# Patient Record
Sex: Female | Born: 1965
Health system: Southern US, Community
[De-identification: ages and names within clinical notes are randomized; demographics above are authoritative.]

## PROBLEM LIST (undated history)

## (undated) DIAGNOSIS — R5383 Other fatigue: Secondary | ICD-10-CM

## (undated) DIAGNOSIS — Z9151 Personal history of suicidal behavior: Secondary | ICD-10-CM

## (undated) DIAGNOSIS — F32A Depression, unspecified: Secondary | ICD-10-CM

## (undated) DIAGNOSIS — R928 Other abnormal and inconclusive findings on diagnostic imaging of breast: Secondary | ICD-10-CM

## (undated) DIAGNOSIS — K219 Gastro-esophageal reflux disease without esophagitis: Secondary | ICD-10-CM

## (undated) DIAGNOSIS — E119 Type 2 diabetes mellitus without complications: Secondary | ICD-10-CM

## (undated) DIAGNOSIS — E669 Obesity, unspecified: Secondary | ICD-10-CM

## (undated) DIAGNOSIS — R7303 Prediabetes: Secondary | ICD-10-CM

## (undated) DIAGNOSIS — K5900 Constipation, unspecified: Secondary | ICD-10-CM

## (undated) DIAGNOSIS — F329 Major depressive disorder, single episode, unspecified: Secondary | ICD-10-CM

## (undated) DIAGNOSIS — Z915 Personal history of self-harm: Secondary | ICD-10-CM

## (undated) DIAGNOSIS — Z91410 Personal history of adult physical and sexual abuse: Secondary | ICD-10-CM

## (undated) DIAGNOSIS — I10 Essential (primary) hypertension: Secondary | ICD-10-CM

## (undated) DIAGNOSIS — D649 Anemia, unspecified: Secondary | ICD-10-CM

## (undated) DIAGNOSIS — F5104 Psychophysiologic insomnia: Secondary | ICD-10-CM

## (undated) HISTORY — DX: Obesity, unspecified: E66.9

## (undated) HISTORY — DX: Other abnormal and inconclusive findings on diagnostic imaging of breast: R92.8

## (undated) HISTORY — DX: Personal history of adult physical and sexual abuse: Z91.410

## (undated) HISTORY — DX: Psychophysiologic insomnia: F51.04

## (undated) HISTORY — DX: Depression, unspecified: F32.A

## (undated) HISTORY — DX: Constipation, unspecified: K59.00

## (undated) HISTORY — DX: Personal history of self-harm: Z91.5

## (undated) HISTORY — DX: Essential (primary) hypertension: I10

## (undated) HISTORY — DX: Personal history of suicidal behavior: Z91.51

## (undated) HISTORY — DX: Major depressive disorder, single episode, unspecified: F32.9

## (undated) HISTORY — DX: Other fatigue: R53.83

## (undated) HISTORY — DX: Gastro-esophageal reflux disease without esophagitis: K21.9

---

## 1989-02-24 HISTORY — PX: OTHER SURGICAL HISTORY: SHX169

## 1999-02-25 HISTORY — PX: TUBAL LIGATION: SHX77

## 2006-07-31 ENCOUNTER — Emergency Department: Payer: Self-pay | Admitting: Emergency Medicine

## 2008-02-07 ENCOUNTER — Emergency Department: Payer: Self-pay | Admitting: Emergency Medicine

## 2008-02-22 ENCOUNTER — Emergency Department: Payer: Self-pay | Admitting: Emergency Medicine

## 2010-02-12 ENCOUNTER — Encounter
Admission: RE | Admit: 2010-02-12 | Discharge: 2010-02-12 | Payer: Self-pay | Source: Home / Self Care | Attending: Gynecology | Admitting: Gynecology

## 2012-05-11 ENCOUNTER — Emergency Department: Payer: Self-pay | Admitting: Emergency Medicine

## 2013-01-06 LAB — LIPID PANEL
Cholesterol: 213 mg/dL — AB (ref 0–200)
HDL: 60 mg/dL (ref 35–70)
LDL Cholesterol: 127 mg/dL
Triglycerides: 131 mg/dL (ref 40–160)

## 2013-01-06 LAB — HM PAP SMEAR: HM PAP: NORMAL

## 2013-03-19 ENCOUNTER — Ambulatory Visit: Payer: Self-pay | Admitting: Family Medicine

## 2013-05-05 ENCOUNTER — Ambulatory Visit: Payer: Self-pay | Admitting: Family Medicine

## 2013-05-05 DIAGNOSIS — R928 Other abnormal and inconclusive findings on diagnostic imaging of breast: Secondary | ICD-10-CM

## 2013-05-05 HISTORY — DX: Other abnormal and inconclusive findings on diagnostic imaging of breast: R92.8

## 2013-12-01 ENCOUNTER — Ambulatory Visit: Payer: Self-pay | Admitting: Family Medicine

## 2014-05-08 ENCOUNTER — Ambulatory Visit: Payer: Self-pay | Admitting: Family Medicine

## 2014-05-08 LAB — HM MAMMOGRAPHY

## 2014-08-20 ENCOUNTER — Encounter: Payer: Self-pay | Admitting: Family Medicine

## 2014-08-20 DIAGNOSIS — F325 Major depressive disorder, single episode, in full remission: Secondary | ICD-10-CM | POA: Insufficient documentation

## 2014-08-20 DIAGNOSIS — R519 Headache, unspecified: Secondary | ICD-10-CM | POA: Insufficient documentation

## 2014-08-20 DIAGNOSIS — G4700 Insomnia, unspecified: Secondary | ICD-10-CM | POA: Insufficient documentation

## 2014-08-20 DIAGNOSIS — Z9151 Personal history of suicidal behavior: Secondary | ICD-10-CM | POA: Insufficient documentation

## 2014-08-20 DIAGNOSIS — E669 Obesity, unspecified: Secondary | ICD-10-CM | POA: Insufficient documentation

## 2014-08-20 DIAGNOSIS — R51 Headache: Secondary | ICD-10-CM

## 2014-08-20 DIAGNOSIS — K219 Gastro-esophageal reflux disease without esophagitis: Secondary | ICD-10-CM | POA: Insufficient documentation

## 2014-08-20 DIAGNOSIS — I10 Essential (primary) hypertension: Secondary | ICD-10-CM | POA: Insufficient documentation

## 2014-08-20 DIAGNOSIS — Z915 Personal history of self-harm: Secondary | ICD-10-CM | POA: Insufficient documentation

## 2014-08-23 ENCOUNTER — Ambulatory Visit (INDEPENDENT_AMBULATORY_CARE_PROVIDER_SITE_OTHER): Payer: BLUE CROSS/BLUE SHIELD | Admitting: Family Medicine

## 2014-08-23 ENCOUNTER — Encounter: Payer: Self-pay | Admitting: Family Medicine

## 2014-08-23 VITALS — BP 110/78 | HR 74 | Temp 97.8°F | Resp 16 | Ht 67.5 in | Wt 198.7 lb

## 2014-08-23 DIAGNOSIS — K219 Gastro-esophageal reflux disease without esophagitis: Secondary | ICD-10-CM | POA: Diagnosis not present

## 2014-08-23 DIAGNOSIS — Z79899 Other long term (current) drug therapy: Secondary | ICD-10-CM | POA: Diagnosis not present

## 2014-08-23 DIAGNOSIS — K5900 Constipation, unspecified: Secondary | ICD-10-CM

## 2014-08-23 DIAGNOSIS — Z13220 Encounter for screening for lipoid disorders: Secondary | ICD-10-CM | POA: Diagnosis not present

## 2014-08-23 DIAGNOSIS — I10 Essential (primary) hypertension: Secondary | ICD-10-CM | POA: Diagnosis not present

## 2014-08-23 DIAGNOSIS — F3341 Major depressive disorder, recurrent, in partial remission: Secondary | ICD-10-CM | POA: Diagnosis not present

## 2014-08-23 DIAGNOSIS — N920 Excessive and frequent menstruation with regular cycle: Secondary | ICD-10-CM

## 2014-08-23 DIAGNOSIS — N951 Menopausal and female climacteric states: Secondary | ICD-10-CM | POA: Diagnosis not present

## 2014-08-23 DIAGNOSIS — G4700 Insomnia, unspecified: Secondary | ICD-10-CM | POA: Diagnosis not present

## 2014-08-23 DIAGNOSIS — K5909 Other constipation: Secondary | ICD-10-CM | POA: Insufficient documentation

## 2014-08-23 MED ORDER — BUPROPION HCL ER (XL) 150 MG PO TB24: 150.0000 mg | ORAL_TABLET | Freq: Every day | ORAL | Status: DC

## 2014-08-23 MED ORDER — POLYETHYLENE GLYCOL 3350 17 G PO PACK: 17.0000 g | PACK | Freq: Every day | ORAL | Status: DC

## 2014-08-23 MED ORDER — NORGESTIMATE-ETH ESTRADIOL 0.25-35 MG-MCG PO TABS: 1.0000 | ORAL_TABLET | Freq: Every day | ORAL | Status: DC

## 2014-08-23 MED ORDER — DIVALPROEX SODIUM 250 MG PO DR TAB: 250.0000 mg | DELAYED_RELEASE_TABLET | Freq: Every evening | ORAL | Status: DC

## 2014-08-23 MED ORDER — HYDROCHLOROTHIAZIDE 12.5 MG PO TABS
12.5000 mg | ORAL_TABLET | Freq: Every day | ORAL | Status: DC
Start: 2014-08-23 — End: 2015-03-28

## 2014-08-23 MED ORDER — ALPRAZOLAM ER 0.5 MG PO TB24: 0.5000 mg | ORAL_TABLET | Freq: Every day | ORAL | Status: DC

## 2014-08-23 MED ORDER — DULOXETINE HCL 60 MG PO CPEP: 60.0000 mg | ORAL_CAPSULE | Freq: Every day | ORAL | Status: DC
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
## 2014-08-23 NOTE — Patient Instructions (Signed)

## 2014-08-24 LAB — COMPREHENSIVE METABOLIC PANEL
A/G RATIO: 1.6 (ref 1.1–2.5)
ALK PHOS: 42 IU/L (ref 39–117)
ALT: 14 IU/L (ref 0–32)
AST: 12 IU/L (ref 0–40)
Albumin: 4 g/dL (ref 3.5–5.5)
BUN/Creatinine Ratio: 13 (ref 9–23)
BUN: 11 mg/dL (ref 6–24)
CHLORIDE: 103 mmol/L (ref 97–108)
CO2: 25 mmol/L (ref 18–29)
Calcium: 8.9 mg/dL (ref 8.7–10.2)
Creatinine, Ser: 0.84 mg/dL (ref 0.57–1.00)
GFR, EST AFRICAN AMERICAN: 95 mL/min/{1.73_m2} (ref >59)
GFR, EST NON AFRICAN AMERICAN: 82 mL/min/{1.73_m2} (ref >59)
Globulin, Total: 2.5 g/dL (ref 1.5–4.5)
Glucose: 92 mg/dL (ref 65–99)
POTASSIUM: 4.6 mmol/L (ref 3.5–5.2)
Sodium: 141 mmol/L (ref 134–144)
Total Protein: 6.5 g/dL (ref 6.0–8.5)

## 2014-08-24 LAB — TSH: TSH: 0.514 u[IU]/mL (ref 0.450–4.500)

## 2014-08-24 LAB — CBC WITH DIFFERENTIAL/PLATELET
BASOS: 0 %
Basophils Absolute: 0 10*3/uL (ref 0.0–0.2)
EOS (ABSOLUTE): 0 10*3/uL (ref 0.0–0.4)
EOS: 0 %
HEMATOCRIT: 34.7 % (ref 34.0–46.6)
Hemoglobin: 11 g/dL — ABNORMAL LOW (ref 11.1–15.9)
IMMATURE GRANS (ABS): 0 10*3/uL (ref 0.0–0.1)
Immature Granulocytes: 0 %
LYMPHS: 49 %
Lymphocytes Absolute: 1.9 10*3/uL (ref 0.7–3.1)
MCH: 25.9 pg — ABNORMAL LOW (ref 26.6–33.0)
MCHC: 31.7 g/dL (ref 31.5–35.7)
MCV: 82 fL (ref 79–97)
Monocytes Absolute: 0.3 10*3/uL (ref 0.1–0.9)
Monocytes: 8 %
NEUTROS PCT: 43 %
Neutrophils Absolute: 1.7 10*3/uL (ref 1.4–7.0)
Platelets: 401 10*3/uL — ABNORMAL HIGH (ref 150–379)
RBC: 4.24 x10E6/uL (ref 3.77–5.28)
RDW: 14.9 % (ref 12.3–15.4)
WBC: 3.9 10*3/uL (ref 3.4–10.8)

## 2014-08-24 LAB — LIPID PANEL
Chol/HDL Ratio: 3.8 ratio units (ref 0.0–4.4)
Cholesterol, Total: 222 mg/dL — ABNORMAL HIGH (ref 100–199)
HDL: 58 mg/dL (ref >39)
LDL Calculated: 150 mg/dL — ABNORMAL HIGH (ref 0–99)
TRIGLYCERIDES: 72 mg/dL (ref 0–149)
VLDL CHOLESTEROL CAL: 14 mg/dL (ref 5–40)

## 2014-08-27 ENCOUNTER — Other Ambulatory Visit: Payer: Self-pay | Admitting: Family Medicine

## 2014-08-27 DIAGNOSIS — D649 Anemia, unspecified: Secondary | ICD-10-CM

## 2014-11-24 ENCOUNTER — Ambulatory Visit: Payer: BLUE CROSS/BLUE SHIELD | Admitting: Family Medicine

## 2014-12-05 ENCOUNTER — Ambulatory Visit: Payer: BLUE CROSS/BLUE SHIELD | Admitting: Family Medicine

## 2014-12-26 ENCOUNTER — Ambulatory Visit: Payer: BLUE CROSS/BLUE SHIELD | Admitting: Family Medicine

## 2015-02-21 ENCOUNTER — Other Ambulatory Visit: Payer: Self-pay | Admitting: Family Medicine

## 2015-02-21 NOTE — Telephone Encounter (Signed)
Patient requesting refill. 

## 2015-03-05 ENCOUNTER — Ambulatory Visit: Payer: BLUE CROSS/BLUE SHIELD | Admitting: Family Medicine

## 2015-03-19 ENCOUNTER — Ambulatory Visit: Payer: BLUE CROSS/BLUE SHIELD | Admitting: Family Medicine

## 2015-03-25 ENCOUNTER — Other Ambulatory Visit: Payer: Self-pay | Admitting: Family Medicine

## 2015-03-26 NOTE — Telephone Encounter (Signed)
Patient requesting refill. 

## 2015-03-28 ENCOUNTER — Ambulatory Visit (INDEPENDENT_AMBULATORY_CARE_PROVIDER_SITE_OTHER): Payer: BLUE CROSS/BLUE SHIELD | Admitting: Family Medicine

## 2015-03-28 ENCOUNTER — Encounter: Payer: Self-pay | Admitting: Family Medicine

## 2015-03-28 VITALS — BP 112/74 | HR 83 | Temp 97.8°F | Resp 16 | Ht 68.0 in | Wt 200.7 lb

## 2015-03-28 DIAGNOSIS — N920 Excessive and frequent menstruation with regular cycle: Secondary | ICD-10-CM | POA: Diagnosis not present

## 2015-03-28 DIAGNOSIS — F411 Generalized anxiety disorder: Secondary | ICD-10-CM | POA: Diagnosis not present

## 2015-03-28 DIAGNOSIS — K5900 Constipation, unspecified: Secondary | ICD-10-CM

## 2015-03-28 DIAGNOSIS — I10 Essential (primary) hypertension: Secondary | ICD-10-CM | POA: Diagnosis not present

## 2015-03-28 DIAGNOSIS — Z23 Encounter for immunization: Secondary | ICD-10-CM | POA: Diagnosis not present

## 2015-03-28 DIAGNOSIS — G4700 Insomnia, unspecified: Secondary | ICD-10-CM | POA: Diagnosis not present

## 2015-03-28 DIAGNOSIS — F3341 Major depressive disorder, recurrent, in partial remission: Secondary | ICD-10-CM

## 2015-03-28 DIAGNOSIS — K5909 Other constipation: Secondary | ICD-10-CM

## 2015-03-28 MED ORDER — DULOXETINE HCL 30 MG PO CPEP: 30.0000 mg | ORAL_CAPSULE | Freq: Every day | ORAL | Status: DC

## 2015-03-28 MED ORDER — POLYETHYLENE GLYCOL 3350 17 G PO PACK: 17.0000 g | PACK | Freq: Every day | ORAL | Status: DC

## 2015-03-28 MED ORDER — DIVALPROEX SODIUM 250 MG PO DR TAB: 250.0000 mg | DELAYED_RELEASE_TABLET | Freq: Every evening | ORAL | Status: DC

## 2015-03-28 MED ORDER — HYDROCHLOROTHIAZIDE 12.5 MG PO TABS: 12.5000 mg | ORAL_TABLET | Freq: Every day | ORAL | Status: DC

## 2015-03-28 MED ORDER — ALPRAZOLAM ER 0.5 MG PO TB24: 0.5000 mg | ORAL_TABLET | Freq: Every day | ORAL | Status: DC

## 2015-03-28 MED ORDER — BUPROPION HCL ER (XL) 300 MG PO TB24: 300.0000 mg | ORAL_TABLET | Freq: Every day | ORAL | Status: DC

## 2015-03-28 MED ORDER — QUETIAPINE FUMARATE 25 MG PO TABS: 25.0000 mg | ORAL_TABLET | Freq: Every day | ORAL | Status: DC
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
## 2015-04-25 ENCOUNTER — Encounter: Payer: Self-pay | Admitting: Family Medicine

## 2015-04-25 ENCOUNTER — Ambulatory Visit (INDEPENDENT_AMBULATORY_CARE_PROVIDER_SITE_OTHER): Payer: BLUE CROSS/BLUE SHIELD | Admitting: Family Medicine

## 2015-04-25 VITALS — BP 118/76 | HR 110 | Temp 98.3°F | Resp 18 | Ht 68.0 in | Wt 204.2 lb

## 2015-04-25 DIAGNOSIS — F3341 Major depressive disorder, recurrent, in partial remission: Secondary | ICD-10-CM

## 2015-04-25 DIAGNOSIS — J040 Acute laryngitis: Secondary | ICD-10-CM | POA: Diagnosis not present

## 2015-04-25 DIAGNOSIS — G4700 Insomnia, unspecified: Secondary | ICD-10-CM | POA: Diagnosis not present

## 2015-04-25 DIAGNOSIS — F411 Generalized anxiety disorder: Secondary | ICD-10-CM

## 2015-04-25 MED ORDER — QUETIAPINE FUMARATE 50 MG PO TABS: 50.0000 mg | ORAL_TABLET | Freq: Every day | ORAL | Status: DC

## 2015-04-25 MED ORDER — PREDNISONE 10 MG PO TABS: 10.0000 mg | ORAL_TABLET | Freq: Two times a day (BID) | ORAL | Status: DC

## 2015-04-25 MED ORDER — DULOXETINE HCL 30 MG PO CPEP: 90.0000 mg | ORAL_CAPSULE | Freq: Every day | ORAL | Status: DC

## 2015-04-25 MED ORDER — ALPRAZOLAM ER 0.5 MG PO TB24: 0.5000 mg | ORAL_TABLET | Freq: Every day | ORAL | Status: DC

## 2015-04-25 MED ORDER — BUPROPION HCL ER (XL) 300 MG PO TB24: 300.0000 mg | ORAL_TABLET | Freq: Every day | ORAL | Status: DC
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
## 2015-07-05 ENCOUNTER — Ambulatory Visit: Payer: BLUE CROSS/BLUE SHIELD | Admitting: Family Medicine

## 2017-02-10 ENCOUNTER — Ambulatory Visit (INDEPENDENT_AMBULATORY_CARE_PROVIDER_SITE_OTHER): Payer: BLUE CROSS/BLUE SHIELD | Admitting: Family Medicine

## 2017-02-10 ENCOUNTER — Encounter: Payer: Self-pay | Admitting: Family Medicine

## 2017-02-10 VITALS — BP 116/70 | HR 70 | Temp 97.1°F | Resp 12 | Ht 67.5 in | Wt 220.5 lb

## 2017-02-10 DIAGNOSIS — N920 Excessive and frequent menstruation with regular cycle: Secondary | ICD-10-CM | POA: Diagnosis not present

## 2017-02-10 DIAGNOSIS — Z113 Encounter for screening for infections with a predominantly sexual mode of transmission: Secondary | ICD-10-CM | POA: Diagnosis not present

## 2017-02-10 DIAGNOSIS — F325 Major depressive disorder, single episode, in full remission: Secondary | ICD-10-CM

## 2017-02-10 DIAGNOSIS — Z124 Encounter for screening for malignant neoplasm of cervix: Secondary | ICD-10-CM

## 2017-02-10 DIAGNOSIS — Z01419 Encounter for gynecological examination (general) (routine) without abnormal findings: Secondary | ICD-10-CM | POA: Diagnosis not present

## 2017-02-10 DIAGNOSIS — D649 Anemia, unspecified: Secondary | ICD-10-CM | POA: Diagnosis not present

## 2017-02-10 DIAGNOSIS — E785 Hyperlipidemia, unspecified: Secondary | ICD-10-CM | POA: Diagnosis not present

## 2017-02-10 DIAGNOSIS — F3341 Major depressive disorder, recurrent, in partial remission: Secondary | ICD-10-CM

## 2017-02-10 DIAGNOSIS — Z1239 Encounter for other screening for malignant neoplasm of breast: Secondary | ICD-10-CM

## 2017-02-10 DIAGNOSIS — Z1211 Encounter for screening for malignant neoplasm of colon: Secondary | ICD-10-CM

## 2017-02-10 DIAGNOSIS — Z13220 Encounter for screening for lipoid disorders: Secondary | ICD-10-CM

## 2017-02-10 MED ORDER — NORGESTIMATE-ETH ESTRADIOL 0.25-35 MG-MCG PO TABS: 1.0000 | ORAL_TABLET | Freq: Every day | ORAL | 11 refills | Status: DC

## 2017-02-10 NOTE — Patient Instructions (Addendum)
Preventive Care 40-64 Years, Female Preventive care refers to lifestyle choices and visits with your health care provider that can promote health and wellness. What does preventive care include?  A yearly physical exam. This is also called an annual well check.  Dental exams once or twice a year.  Routine eye exams. Ask your health care provider how often you should have your eyes checked.  Personal lifestyle choices, including: ? Daily care of your teeth and gums. ? Regular physical activity. ? Eating a healthy diet. ? Avoiding tobacco and drug use. ? Limiting alcohol use. ? Practicing safe sex. ? Taking low-dose aspirin daily starting at age 51. ? Taking vitamin and mineral supplements as recommended by your health care provider. What happens during an annual well check? The services and screenings done by your health care provider during your annual well check will depend on your age, overall health, lifestyle risk factors, and family history of disease. Counseling Your health care provider may ask you questions about your:  Alcohol use.  Tobacco use.  Drug use.  Emotional well-being.  Home and relationship well-being.  Sexual activity.  Eating habits.  Work and work Statistician.  Method of birth control.  Menstrual cycle.  Pregnancy history.  Screening You may have the following tests or measurements:  Height, weight, and BMI.  Blood pressure.  Lipid and cholesterol levels. These may be checked every 5 years, or more frequently if you are over 51 years old.  Skin check.  Lung cancer screening. You may have this screening every year starting at age 51 if you have a 30-pack-year history of smoking and currently smoke or have quit within the past 15 years.  Fecal occult blood test (FOBT) of the stool. You may have this test every year starting at age 51.  Flexible sigmoidoscopy or colonoscopy. You may have a sigmoidoscopy every 5 years or a colonoscopy  every 10 years starting at age 51.  Hepatitis C blood test.  Hepatitis B blood test.  Sexually transmitted disease (STD) testing.  Diabetes screening. This is done by checking your blood sugar (glucose) after you have not eaten for a while (fasting). You may have this done every 1-3 years.  Mammogram. This may be done every 1-2 years. Talk to your health care provider about when you should start having regular mammograms. This may depend on whether you have a family history of breast cancer.  BRCA-related cancer screening. This may be done if you have a family history of breast, ovarian, tubal, or peritoneal cancers.  Pelvic exam and Pap test. This may be done every 3 years starting at age 51. Starting at age 36, this may be done every 5 years if you have a Pap test in combination with an HPV test.  Bone density scan. This is done to screen for osteoporosis. You may have this scan if you are at high risk for osteoporosis.  Discuss your test results, treatment options, and if necessary, the need for more tests with your health care provider. Vaccines Your health care provider may recommend certain vaccines, such as:  Influenza vaccine. This is recommended every year.  Tetanus, diphtheria, and acellular pertussis (Tdap, Td) vaccine. You may need a Td booster every 10 years.  Varicella vaccine. You may need this if you have not been vaccinated.  Zoster vaccine. You may need this after age 5.  Measles, mumps, and rubella (MMR) vaccine. You may need at least one dose of MMR if you were born in  1957 or later. You may also need a second dose.  Pneumococcal 13-valent conjugate (PCV13) vaccine. You may need this if you have certain conditions and were not previously vaccinated.  Pneumococcal polysaccharide (PPSV23) vaccine. You may need one or two doses if you smoke cigarettes or if you have certain conditions.  Meningococcal vaccine. You may need this if you have certain  conditions.  Hepatitis A vaccine. You may need this if you have certain conditions or if you travel or work in places where you may be exposed to hepatitis A.  Hepatitis B vaccine. You may need this if you have certain conditions or if you travel or work in places where you may be exposed to hepatitis B.  Haemophilus influenzae type b (Hib) vaccine. You may need this if you have certain conditions.  Talk to your health care provider about which screenings and vaccines you need and how often you need them. This information is not intended to replace advice given to you by your health care provider. Make sure you discuss any questions you have with your health care provider. Document Released: 03/09/2015 Document Revised: 10/31/2015 Document Reviewed: 12/12/2014 Elsevier Interactive Patient Education  2017 [employer].  Discussed Shingrix with patient, check coverage with insurance
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
## 2017-02-13 LAB — PAP IG, CT-NG NAA, HPV HIGH-RISK
C. TRACHOMATIS RNA, TMA: NOT DETECTED
HPV DNA HIGH RISK: NOT DETECTED
N. gonorrhoeae RNA, TMA: NOT DETECTED

## 2017-02-17 LAB — LIPID PANEL
CHOL/HDL RATIO: 4.2 (calc) (ref <5.0)
Cholesterol: 210 mg/dL — ABNORMAL HIGH (ref <200)
HDL: 50 mg/dL — AB (ref >50)
LDL Cholesterol (Calc): 136 mg/dL (calc) — ABNORMAL HIGH
Non-HDL Cholesterol (Calc): 160 mg/dL (calc) — ABNORMAL HIGH (ref <130)
Triglycerides: 118 mg/dL (ref <150)

## 2017-02-17 LAB — COMPLETE METABOLIC PANEL WITH GFR
AG RATIO: 1.4 (calc) (ref 1.0–2.5)
ALBUMIN MSPROF: 3.9 g/dL (ref 3.6–5.1)
ALT: 11 U/L (ref 6–29)
AST: 14 U/L (ref 10–35)
Alkaline phosphatase (APISO): 57 U/L (ref 33–130)
BUN: 12 mg/dL (ref 7–25)
CALCIUM: 8.9 mg/dL (ref 8.6–10.4)
CHLORIDE: 107 mmol/L (ref 98–110)
CO2: 26 mmol/L (ref 20–32)
Creat: 0.85 mg/dL (ref 0.50–1.05)
GFR, EST AFRICAN AMERICAN: 92 mL/min/{1.73_m2} (ref >=60)
GFR, EST NON AFRICAN AMERICAN: 79 mL/min/{1.73_m2} (ref >=60)
GLUCOSE: 83 mg/dL (ref 65–99)
Globulin: 2.8 g/dL (calc) (ref 1.9–3.7)
Potassium: 4 mmol/L (ref 3.5–5.3)
Sodium: 140 mmol/L (ref 135–146)
TOTAL PROTEIN: 6.7 g/dL (ref 6.1–8.1)
Total Bilirubin: 0.2 mg/dL (ref 0.2–1.2)

## 2017-02-17 LAB — CBC WITH DIFFERENTIAL/PLATELET
BASOS ABS: 10 {cells}/uL (ref 0–200)
Basophils Relative: 0.2 %
EOS ABS: 160 {cells}/uL (ref 15–500)
EOS PCT: 3.2 %
HEMATOCRIT: 32 % — AB (ref 35.0–45.0)
HEMOGLOBIN: 10 g/dL — AB (ref 11.7–15.5)
LYMPHS ABS: 2265 {cells}/uL (ref 850–3900)
MCH: 24.6 pg — AB (ref 27.0–33.0)
MCHC: 31.3 g/dL — ABNORMAL LOW (ref 32.0–36.0)
MCV: 78.6 fL — AB (ref 80.0–100.0)
MPV: 10.2 fL (ref 7.5–12.5)
Monocytes Relative: 9.9 %
NEUTROS ABS: 2070 {cells}/uL (ref 1500–7800)
NEUTROS PCT: 41.4 %
Platelets: 381 10*3/uL (ref 140–400)
RBC: 4.07 10*6/uL (ref 3.80–5.10)
RDW: 15.3 % — AB (ref 11.0–15.0)
Total Lymphocyte: 45.3 %
WBC: 5 10*3/uL (ref 3.8–10.8)
WBCMIX: 495 {cells}/uL (ref 200–950)

## 2017-02-17 LAB — HIV ANTIBODY (ROUTINE TESTING W REFLEX): HIV: NONREACTIVE

## 2017-02-17 LAB — IRON,TIBC AND FERRITIN PANEL
%SAT: 9 % — AB (ref 11–50)
FERRITIN: 13 ng/mL (ref 10–232)
Iron: 37 ug/dL — ABNORMAL LOW (ref 45–160)
TIBC: 415 mcg/dL (calc) (ref 250–450)

## 2017-02-17 LAB — HEPATITIS PANEL, ACUTE
HEP C AB: NONREACTIVE
Hep A IgM: NONREACTIVE
Hep B C IgM: NONREACTIVE
Hepatitis B Surface Ag: NONREACTIVE
SIGNAL TO CUT-OFF: 0.02 (ref <1.00)

## 2017-02-17 LAB — RPR: RPR: NONREACTIVE

## 2017-02-17 LAB — HEMOGLOBIN A1C
HEMOGLOBIN A1C: 6.1 %{Hb} — AB (ref <5.7)
Mean Plasma Glucose: 128 (calc)
eAG (mmol/L): 7.1 (calc)

## 2017-02-17 LAB — TEST AUTHORIZATION

## 2017-02-17 LAB — TSH: TSH: 1.19 mIU/L

## 2017-02-19 ENCOUNTER — Telehealth: Payer: Self-pay | Admitting: Family Medicine

## 2017-02-19 NOTE — Telephone Encounter (Signed)
Copied from [place]. 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 Please give pt. Her lab result. Thanks

## 2017-02-19 NOTE — Telephone Encounter (Signed)
Pt  Notified  Of    Low  Iron  Results    Pt    Advised    That she  Needed  To  Take   Ferrous   Sulfate.  Pt  Stated  She  Did  Not  Wish  To  Take  Ferrous  Sulfate   As  It  Constipates  Her .  She  Is  Interested  In   other  Options.  She  Has  Cologard  Kit but has  Not  Completed  It yet .

## 2017-02-20 NOTE — Telephone Encounter (Signed)
Patient states she will not eat any nuts but will try the diet with high Iron foods such as liver, beans and leafy greens.

## 2017-02-20 NOTE — Telephone Encounter (Signed)
Any other options than the Iron tablets since the patient declined this option.

## 2017-05-03 DIAGNOSIS — Z1211 Encounter for screening for malignant neoplasm of colon: Secondary | ICD-10-CM | POA: Diagnosis not present

## 2017-05-03 DIAGNOSIS — Z1212 Encounter for screening for malignant neoplasm of rectum: Secondary | ICD-10-CM | POA: Diagnosis not present

## 2017-05-13 LAB — COLOGUARD: Cologuard: NEGATIVE

## 2017-05-19 ENCOUNTER — Encounter: Payer: Self-pay | Admitting: Family Medicine

## 2017-08-07 ENCOUNTER — Encounter: Payer: Self-pay | Admitting: Nurse Practitioner

## 2017-08-07 ENCOUNTER — Ambulatory Visit: Payer: BLUE CROSS/BLUE SHIELD | Admitting: Nurse Practitioner

## 2017-08-07 VITALS — BP 124/80 | HR 89 | Temp 98.4°F | Resp 16 | Ht 68.0 in | Wt 219.9 lb

## 2017-08-07 DIAGNOSIS — D500 Iron deficiency anemia secondary to blood loss (chronic): Secondary | ICD-10-CM | POA: Diagnosis not present

## 2017-08-07 DIAGNOSIS — N920 Excessive and frequent menstruation with regular cycle: Secondary | ICD-10-CM
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
## 2017-08-07 NOTE — Patient Instructions (Addendum)
Please do call to schedule your mammogram; the number to schedule one at either [facility] or [facility] is [phone]   Will check lab work to see where your blood count and iron are now, based off that will order supplementation - Increase fiber diet and/OR take fiber supplement like metamucil - Take colace (docusate sodium) if needed for constipation  - Drinking 64 ounces of water a day   Dysmenorrhea Dysmenorrhea means painful cramps during your period (menstrual period). You will have pain in your lower belly (abdomen). The pain is caused by the tightening (contracting) of the muscles of the womb (uterus). The pain may be mild or very bad. With this condition, you may:  Have a headache.  Feel sick to your stomach (nauseous).  Throw up (vomit).  Have lower back pain.  Follow these instructions at home: Helping pain and cramping  Put heat on your lower back or belly when you have pain or cramps. Use the heat source that your doctor tells you to use. ? Place a towel between your skin and the heat. ? Leave the heat on for 20-30 minutes. ? Remove the heat if your skin turns bright red. This is especially important if you cannot feel pain, heat, or cold. ? Do not have a heating pad on during sleep.  Do aerobic exercises. These include walking, swimming, or biking. These may help with cramps.  Massage your lower back or belly. This may help lessen pain. General instructions  Take over-the-counter and prescription medicines only as told by your doctor.  Do not drive or use heavy machinery while taking prescription pain medicine.  Avoid alcohol and caffeine during and right before your period. These can make cramps worse.  Do not use any products that have nicotine or tobacco. These include cigarettes and e-cigarettes. If you need help quitting, ask your doctor.  Keep all follow-up visits as told by your doctor. This is important. Contact a  doctor if:  You have pain that gets worse.  You have pain that does not get better with medicine.  You have pain during sex.  You feel sick to your stomach or you throw up during your period, and medicine does not help. Get help right away if:  You pass out (faint). Summary  Dysmenorrhea means painful cramps during your period (menstrual period).  Put heat on your lower back or belly when you have pain or cramps.  Do exercises like walking, swimming, or biking to help with cramps.  Contact a doctor if you have pain during sex. This information is not intended to replace advice given to you by your health care provider. Make sure you discuss any questions you have with your health care provider. Document Released: 05/09/2008 Document Revised: 02/28/2016 Document Reviewed: 02/28/2016 Elsevier Interactive Patient Education  2017 [employer].

## 2017-08-08 LAB — CBC
HEMATOCRIT: 28.5 % — AB (ref 35.0–45.0)
Hemoglobin: 8.7 g/dL — ABNORMAL LOW (ref 11.7–15.5)
MCH: 22.1 pg — ABNORMAL LOW (ref 27.0–33.0)
MCHC: 30.5 g/dL — ABNORMAL LOW (ref 32.0–36.0)
MCV: 72.5 fL — AB (ref 80.0–100.0)
MPV: 10 fL (ref 7.5–12.5)
Platelets: 428 10*3/uL — ABNORMAL HIGH (ref 140–400)
RBC: 3.93 10*6/uL (ref 3.80–5.10)
RDW: 15.7 % — ABNORMAL HIGH (ref 11.0–15.0)
WBC: 6.5 10*3/uL (ref 3.8–10.8)

## 2017-08-08 LAB — IRON,TIBC AND FERRITIN PANEL
%SAT: 7 % — AB (ref 11–50)
FERRITIN: 8 ng/mL — AB (ref 10–232)
Iron: 32 ug/dL — ABNORMAL LOW (ref 45–160)
TIBC: 491 mcg/dL (calc) — ABNORMAL HIGH (ref 250–450)

## 2017-08-10 ENCOUNTER — Other Ambulatory Visit: Payer: Self-pay | Admitting: Nurse Practitioner

## 2017-08-10 DIAGNOSIS — D500 Iron deficiency anemia secondary to blood loss (chronic): Secondary | ICD-10-CM

## 2017-08-12 ENCOUNTER — Ambulatory Visit: Payer: BLUE CROSS/BLUE SHIELD | Admitting: Family Medicine
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
## 2017-08-18 ENCOUNTER — Encounter: Payer: Self-pay | Admitting: Oncology

## 2017-08-18 ENCOUNTER — Inpatient Hospital Stay: Payer: BLUE CROSS/BLUE SHIELD

## 2017-08-18 ENCOUNTER — Inpatient Hospital Stay: Payer: BLUE CROSS/BLUE SHIELD | Attending: Oncology | Admitting: Oncology

## 2017-08-18 ENCOUNTER — Other Ambulatory Visit: Payer: Self-pay

## 2017-08-18 VITALS — BP 160/90 | HR 80 | Temp 98.0°F | Resp 18 | Wt 222.3 lb

## 2017-08-18 VITALS — BP 154/89 | HR 70 | Resp 18

## 2017-08-18 DIAGNOSIS — N920 Excessive and frequent menstruation with regular cycle: Secondary | ICD-10-CM

## 2017-08-18 DIAGNOSIS — D500 Iron deficiency anemia secondary to blood loss (chronic): Secondary | ICD-10-CM

## 2017-08-18 MED ORDER — SODIUM CHLORIDE 0.9 % IV SOLN
Freq: Once | INTRAVENOUS | Status: AC
  Administered 2017-08-18: 15:00:00 via INTRAVENOUS
  Filled 2017-08-18: qty 1000

## 2017-08-18 MED ORDER — FAMOTIDINE IN NACL 20-0.9 MG/50ML-% IV SOLN
20.0000 mg | Freq: Two times a day (BID) | INTRAVENOUS | Status: DC
  Administered 2017-08-18: 20 mg via INTRAVENOUS
  Filled 2017-08-18: qty 50

## 2017-08-18 MED ORDER — SODIUM CHLORIDE 0.9 % IV SOLN
510.0000 mg | Freq: Once | INTRAVENOUS | Status: AC
  Administered 2017-08-18: 510 mg via INTRAVENOUS
  Filled 2017-08-18: qty 17

## 2017-08-18 NOTE — Progress Notes (Signed)
New patient in for iron deficiency anemia.  

## 2017-08-18 NOTE — Progress Notes (Signed)
15:10 - patient completed Feraheme at 1458, complained of chest heaviness; checked vitals (see flowsheet) and called Beckey Rutter, NP who came over to assess the patient.  Patient complained of no other symptoms.  Patient stated she had eaten tacos prior to her appointment and she may have indigestion.  Lauren ordered Pepcid, reassessed patient after receiving Pepcid and stated she no longer felt the chest heaviness and was feeling back to baseline.
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
## 2017-10-21 ENCOUNTER — Other Ambulatory Visit: Payer: Self-pay

## 2017-10-21 ENCOUNTER — Inpatient Hospital Stay: Payer: BLUE CROSS/BLUE SHIELD | Attending: Oncology

## 2017-10-21 ENCOUNTER — Inpatient Hospital Stay: Payer: BLUE CROSS/BLUE SHIELD

## 2017-10-21 ENCOUNTER — Inpatient Hospital Stay (HOSPITAL_BASED_OUTPATIENT_CLINIC_OR_DEPARTMENT_OTHER): Payer: BLUE CROSS/BLUE SHIELD | Admitting: Oncology

## 2017-10-21 VITALS — BP 179/94 | HR 56 | Resp 18

## 2017-10-21 VITALS — BP 156/77 | HR 58 | Temp 97.4°F | Resp 18 | Wt 217.1 lb

## 2017-10-21 DIAGNOSIS — D509 Iron deficiency anemia, unspecified: Secondary | ICD-10-CM | POA: Diagnosis not present

## 2017-10-21 DIAGNOSIS — D500 Iron deficiency anemia secondary to blood loss (chronic): Secondary | ICD-10-CM

## 2017-10-21 LAB — CBC WITH DIFFERENTIAL/PLATELET
BASOS ABS: 0 10*3/uL (ref 0–0.1)
BASOS PCT: 0 %
EOS ABS: 0.1 10*3/uL (ref 0–0.7)
Eosinophils Relative: 3 %
HEMATOCRIT: 31.3 % — AB (ref 35.0–47.0)
Hemoglobin: 10 g/dL — ABNORMAL LOW (ref 12.0–16.0)
Lymphocytes Relative: 37 %
Lymphs Abs: 1.8 10*3/uL (ref 1.0–3.6)
MCH: 24.4 pg — ABNORMAL LOW (ref 26.0–34.0)
MCHC: 31.8 g/dL — AB (ref 32.0–36.0)
MCV: 76.7 fL — ABNORMAL LOW (ref 80.0–100.0)
MONOS PCT: 11 %
Monocytes Absolute: 0.5 10*3/uL (ref 0.2–0.9)
NEUTROS ABS: 2.5 10*3/uL (ref 1.4–6.5)
NEUTROS PCT: 49 %
Platelets: 363 10*3/uL (ref 150–440)
RBC: 4.09 MIL/uL (ref 3.80–5.20)
RDW: 18.1 % — ABNORMAL HIGH (ref 11.5–14.5)
WBC: 5 10*3/uL (ref 3.6–11.0)

## 2017-10-21 LAB — IRON AND TIBC
IRON: 35 ug/dL (ref 28–170)
Saturation Ratios: 7 % — ABNORMAL LOW (ref 10.4–31.8)
TIBC: 481 ug/dL — ABNORMAL HIGH (ref 250–450)
UIBC: 446 ug/dL

## 2017-10-21 LAB — FERRITIN: FERRITIN: 6 ng/mL — AB (ref 11–307)

## 2017-10-21 MED ORDER — SODIUM CHLORIDE 0.9 % IV SOLN
Freq: Once | INTRAVENOUS | Status: AC
  Administered 2017-10-21: 15:00:00 via INTRAVENOUS
  Filled 2017-10-21: qty 250

## 2017-10-21 MED ORDER — DIPHENHYDRAMINE HCL 50 MG/ML IJ SOLN
25.0000 mg | Freq: Once | INTRAMUSCULAR | Status: AC
  Administered 2017-10-21: 25 mg via INTRAVENOUS

## 2017-10-21 MED ORDER — SODIUM CHLORIDE 0.9 % IV SOLN
510.0000 mg | Freq: Once | INTRAVENOUS | Status: AC
  Administered 2017-10-21: 510 mg via INTRAVENOUS
  Filled 2017-10-21: qty 17

## 2017-10-21 MED ORDER — METHYLPREDNISOLONE SODIUM SUCC 125 MG IJ SOLR
125.0000 mg | Freq: Once | INTRAMUSCULAR | Status: AC
  Administered 2017-10-21: 125 mg via INTRAVENOUS

## 2017-10-21 NOTE — Progress Notes (Signed)
Pt here today for second infusion of feraheme, soon after starting infusion pt began to complain of SOB, CP, being hot and nausea. Infusion immediately stopped and NS bolus started, along with standard emergency meds, see MAR, provider notified and at chairside. VS taken and BP remains elevated, multiple staff members at chairside to assist in pt care, pt stabilized and pt verbalized feeling "normal" prior to d/c today.

## 2017-10-21 NOTE — Progress Notes (Signed)
Here for follow up. Stated feeling good and energy level" ok"  Noticed immediate change after last infusion ( improved energy)

## 2017-11-05 ENCOUNTER — Ambulatory Visit: Payer: BLUE CROSS/BLUE SHIELD | Admitting: Family Medicine

## 2017-11-05 ENCOUNTER — Encounter: Payer: Self-pay | Admitting: Family Medicine

## 2017-11-05 VITALS — BP 112/60 | HR 103 | Temp 98.3°F | Resp 16 | Ht 68.0 in | Wt 216.2 lb

## 2017-11-05 DIAGNOSIS — D500 Iron deficiency anemia secondary to blood loss (chronic): Secondary | ICD-10-CM

## 2017-11-05 DIAGNOSIS — F325 Major depressive disorder, single episode, in full remission: Secondary | ICD-10-CM

## 2017-11-05 DIAGNOSIS — Z23 Encounter for immunization: Secondary | ICD-10-CM | POA: Diagnosis not present

## 2017-11-05 DIAGNOSIS — N920 Excessive and frequent menstruation with regular cycle: Secondary | ICD-10-CM | POA: Diagnosis not present
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
## 2017-11-10 ENCOUNTER — Ambulatory Visit: Payer: BLUE CROSS/BLUE SHIELD | Admitting: Family Medicine

## 2017-11-13 ENCOUNTER — Ambulatory Visit: Payer: BLUE CROSS/BLUE SHIELD | Admitting: Family Medicine

## 2017-11-30 ENCOUNTER — Ambulatory Visit (INDEPENDENT_AMBULATORY_CARE_PROVIDER_SITE_OTHER): Payer: BLUE CROSS/BLUE SHIELD | Admitting: Obstetrics and Gynecology

## 2017-11-30 ENCOUNTER — Ambulatory Visit (INDEPENDENT_AMBULATORY_CARE_PROVIDER_SITE_OTHER): Payer: BLUE CROSS/BLUE SHIELD

## 2017-11-30 ENCOUNTER — Encounter: Payer: Self-pay | Admitting: Obstetrics and Gynecology

## 2017-11-30 VITALS — BP 124/78 | Ht 67.0 in | Wt 219.0 lb

## 2017-11-30 DIAGNOSIS — D250 Submucous leiomyoma of uterus: Secondary | ICD-10-CM

## 2017-11-30 DIAGNOSIS — D251 Intramural leiomyoma of uterus: Secondary | ICD-10-CM

## 2017-11-30 DIAGNOSIS — N939 Abnormal uterine and vaginal bleeding, unspecified: Secondary | ICD-10-CM
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
## 2017-12-03 ENCOUNTER — Telehealth: Payer: Self-pay | Admitting: Obstetrics and Gynecology

## 2017-12-03 NOTE — Telephone Encounter (Signed)
Patient is calling for results. Patient is requesting a call between 1 pm and 3 pm due to work. Thank you

## 2017-12-09 ENCOUNTER — Telehealth: Payer: Self-pay | Admitting: Obstetrics and Gynecology

## 2017-12-10 NOTE — Telephone Encounter (Signed)
Pt called after hour nurse last night at 5:51pm stating she needs her test results.  She can be reached only between the hours of 1:30p-2:20p.  [phone]

## 2017-12-10 NOTE — Telephone Encounter (Signed)
Please call her at that time and put her on hold and come get me. Thanks, Dr. Gilman Schmidt

## 2017-12-10 NOTE — Telephone Encounter (Signed)
CRS spoke c pt.

## 2017-12-11 ENCOUNTER — Telehealth: Payer: Self-pay | Admitting: Obstetrics and Gynecology

## 2017-12-11 NOTE — Telephone Encounter (Signed)
Patient is aware of H&P at [facility] on 12/31/17 @ 1:30pm, Pre-admit Testing phone interview to be scheduled, and OR on 01/07/18. Patient is aware she may receive calls from the [facility]. Patient confirmed BCBS and no secondary insurance. BCBS information discussed. Ext given.

## 2017-12-19 ENCOUNTER — Other Ambulatory Visit: Payer: Self-pay | Admitting: Family Medicine

## 2017-12-19 DIAGNOSIS — N920 Excessive and frequent menstruation with regular cycle: Secondary | ICD-10-CM

## 2017-12-31 ENCOUNTER — Encounter: Payer: Self-pay | Admitting: Obstetrics and Gynecology

## 2017-12-31 ENCOUNTER — Ambulatory Visit (INDEPENDENT_AMBULATORY_CARE_PROVIDER_SITE_OTHER): Payer: BLUE CROSS/BLUE SHIELD | Admitting: Obstetrics and Gynecology

## 2017-12-31 VITALS — BP 124/60 | HR 85 | Ht 67.5 in | Wt 223.0 lb

## 2017-12-31 DIAGNOSIS — N939 Abnormal uterine and vaginal bleeding, unspecified: Secondary | ICD-10-CM

## 2017-12-31 DIAGNOSIS — D250 Submucous leiomyoma of uterus: Secondary | ICD-10-CM

## 2018-01-01 ENCOUNTER — Encounter
Admission: RE | Admit: 2018-01-01 | Discharge: 2018-01-01 | Disposition: A | Discharge status: Home / Self Care | Payer: BLUE CROSS/BLUE SHIELD | Source: Ambulatory Visit | Attending: Obstetrics and Gynecology | Admitting: Obstetrics and Gynecology

## 2018-01-01 ENCOUNTER — Other Ambulatory Visit: Payer: Self-pay

## 2018-01-01 HISTORY — DX: Anemia, unspecified: D64.9

## 2018-01-01 NOTE — Patient Instructions (Signed)
Your procedure is scheduled on: 01-07-18  Report to Same Day Surgery 2nd floor medical mall [facility] Entrance-take elevator on left to 2nd floor.  Check in with surgery information desk.) To find out your arrival time please call [phone] between 1PM - 3PM on 01-06-18  Remember: Instructions that are not followed completely may result in serious medical risk, up to and including death, or upon the discretion of your surgeon and anesthesiologist your surgery may need to be rescheduled.    _x___ 1. Do not eat food after midnight the night before your procedure. You may drink clear liquids up to 2 hours before you are scheduled to arrive at the hospital for your procedure.  Do not drink clear liquids within 2 hours of your scheduled arrival to the hospital.  Clear liquids include  --Water or Apple juice without pulp  --Clear carbohydrate beverage such as ClearFast or Gatorade  --Black Coffee or Clear Tea (No milk, no creamers, do not add anything to the coffee or Tea   ____Ensure clear carbohydrate drink on the way to the hospital for bariatric patients  ____Ensure clear carbohydrate drink 3 hours before surgery for Dr Dwyane Luo patients if physician instructed.   No gum chewing or hard candies.     __x__ 2. No Alcohol for 24 hours before or after surgery.   __x__3. No Smoking or e-cigarettes for 24 prior to surgery.  Do not use any chewable tobacco products for at least 6 hour prior to surgery   ____  4. Bring all medications with you on the day of surgery if instructed.    __x__ 5. Notify your doctor if there is any change in your medical condition     (cold, fever, infections).    x___6. On the morning of surgery brush your teeth with toothpaste and water.  You may rinse your mouth with mouth wash if you wish.  Do not swallow any toothpaste or mouthwash.   Do not wear jewelry, make-up, hairpins, clips or nail polish.  Do not wear lotions, powders, or perfumes. You may wear  deodorant.  Do not shave 48 hours prior to surgery. Men may shave face and neck.  Do not bring valuables to the hospital.    [facility] is not responsible for any belongings or valuables.               Contacts, dentures or bridgework may not be worn into surgery.  Leave your suitcase in the car. After surgery it may be brought to your room.  For patients admitted to the hospital, discharge time is determined by your treatment team.  _  Patients discharged the day of surgery will not be allowed to drive home.  You will need someone to drive you home and stay with you the night of your procedure.    Please read over the following fact sheets that you were given:   [facility] Preparing for Surgery  ____ Take anti-hypertensive listed below, cardiac, seizure, asthma, anti-reflux and psychiatric medicines. These include:  1. NONE  2.  3.  4.  5.  6.  ____Fleets enema or Magnesium Citrate as directed.   ____ Use CHG Soap or sage wipes as directed on instruction sheet   ____ Use inhalers on the day of surgery and bring to hospital day of surgery  ____ Stop Metformin and Janumet 2 days prior to surgery.    ____ Take 1/2 of usual insulin dose the night before surgery and none on  the morning surgery.   ____ Follow recommendations from Cardiologist, Pulmonologist or PCP regarding  stopping Aspirin, Coumadin, Plavix ,Eliquis, Effient, or Pradaxa, and Pletal.  X____Stop Anti-inflammatories such as Advil, Aleve, Ibuprofen, Motrin, Naproxen, Naprosyn, Goodies powders or aspirin products NOW- OK to take Tylenol    ____ Stop supplements until after surgery.    ____ Bring C-Pap to the hospital.

## 2018-01-07 ENCOUNTER — Ambulatory Visit
Admission: RE | Admit: 2018-01-07 | Discharge: 2018-01-07 | Disposition: A | Discharge status: Home / Self Care | Payer: BLUE CROSS/BLUE SHIELD | Source: Ambulatory Visit | Attending: Obstetrics and Gynecology | Admitting: Obstetrics and Gynecology

## 2018-01-07 ENCOUNTER — Ambulatory Visit: Payer: BLUE CROSS/BLUE SHIELD | Admitting: Certified Registered Nurse Anesthetist

## 2018-01-07 ENCOUNTER — Encounter
Admission: RE | Disposition: A | Discharge status: Home / Self Care | Payer: Self-pay | Source: Ambulatory Visit | Attending: Obstetrics and Gynecology

## 2018-01-07 DIAGNOSIS — Z87891 Personal history of nicotine dependence: Secondary | ICD-10-CM | POA: Diagnosis not present

## 2018-01-07 DIAGNOSIS — D250 Submucous leiomyoma of uterus: Secondary | ICD-10-CM | POA: Diagnosis not present

## 2018-01-07 DIAGNOSIS — I10 Essential (primary) hypertension: Secondary | ICD-10-CM | POA: Diagnosis not present

## 2018-01-07 HISTORY — PX: DILATATION & CURETTAGE/HYSTEROSCOPY WITH MYOSURE: SHX6511

## 2018-01-07 LAB — CBC
HEMATOCRIT: 32.3 % — AB (ref 36.0–46.0)
Hemoglobin: 9.6 g/dL — ABNORMAL LOW (ref 12.0–15.0)
MCH: 23.4 pg — AB (ref 26.0–34.0)
MCHC: 29.7 g/dL — ABNORMAL LOW (ref 30.0–36.0)
MCV: 78.6 fL — AB (ref 80.0–100.0)
PLATELETS: 477 10*3/uL — AB (ref 150–400)
RBC: 4.11 MIL/uL (ref 3.87–5.11)
RDW: 17.4 % — AB (ref 11.5–15.5)
WBC: 4.7 10*3/uL (ref 4.0–10.5)
nRBC: 0 % (ref 0.0–0.2)

## 2018-01-07 LAB — TYPE AND SCREEN
ABO/RH(D): O POS
Antibody Screen: NEGATIVE

## 2018-01-07 LAB — POCT PREGNANCY, URINE: Preg Test, Ur: NEGATIVE

## 2018-01-07 SURGERY — DILATATION & CURETTAGE/HYSTEROSCOPY WITH MYOSURE
Anesthesia: General

## 2018-01-07 MED ORDER — FENTANYL CITRATE (PF) 100 MCG/2ML IJ SOLN
INTRAMUSCULAR | Status: AC
  Filled 2018-01-07: qty 2

## 2018-01-07 MED ORDER — SODIUM CHLORIDE 0.9 % IR SOLN
Status: DC | PRN
  Administered 2018-01-07: 1620 mL

## 2018-01-07 MED ORDER — LACTATED RINGERS IV SOLN
INTRAVENOUS | Status: DC
  Administered 2018-01-07 (×2): via INTRAVENOUS

## 2018-01-07 MED ORDER — FENTANYL CITRATE (PF) 100 MCG/2ML IJ SOLN
INTRAMUSCULAR | Status: AC
  Administered 2018-01-07: 25 ug via INTRAVENOUS
  Filled 2018-01-07: qty 2

## 2018-01-07 MED ORDER — PROPOFOL 10 MG/ML IV BOLUS
INTRAVENOUS | Status: DC | PRN
  Administered 2018-01-07: 200 mg via INTRAVENOUS

## 2018-01-07 MED ORDER — OXYCODONE HCL 5 MG PO TABS: 5.0000 mg | ORAL_TABLET | Freq: Three times a day (TID) | ORAL | 0 refills | Status: DC | PRN

## 2018-01-07 MED ORDER — MIDAZOLAM HCL 2 MG/2ML IJ SOLN
INTRAMUSCULAR | Status: AC
  Filled 2018-01-07: qty 2

## 2018-01-07 MED ORDER — ONDANSETRON HCL 4 MG/2ML IJ SOLN
INTRAMUSCULAR | Status: AC
  Filled 2018-01-07: qty 2

## 2018-01-07 MED ORDER — PROPOFOL 10 MG/ML IV BOLUS
INTRAVENOUS | Status: AC
  Filled 2018-01-07: qty 20

## 2018-01-07 MED ORDER — IBUPROFEN 600 MG PO TABS: 600.0000 mg | ORAL_TABLET | Freq: Four times a day (QID) | ORAL | 3 refills | Status: DC | PRN

## 2018-01-07 MED ORDER — LIDOCAINE HCL (CARDIAC) PF 100 MG/5ML IV SOSY
PREFILLED_SYRINGE | INTRAVENOUS | Status: DC | PRN
  Administered 2018-01-07: 60 mg via INTRAVENOUS

## 2018-01-07 MED ORDER — PROMETHAZINE HCL 25 MG/ML IJ SOLN
6.2500 mg | INTRAMUSCULAR | Status: DC | PRN
  Administered 2018-01-07: 6.25 mg via INTRAVENOUS

## 2018-01-07 MED ORDER — VASOPRESSIN 20 UNIT/ML IV SOLN
INTRAVENOUS | Status: AC
  Filled 2018-01-07: qty 1

## 2018-01-07 MED ORDER — KETOROLAC TROMETHAMINE 30 MG/ML IJ SOLN
INTRAMUSCULAR | Status: DC | PRN
  Administered 2018-01-07: 30 mg via INTRAVENOUS

## 2018-01-07 MED ORDER — LACTATED RINGERS IV SOLN: INTRAVENOUS | Status: DC

## 2018-01-07 MED ORDER — MIDAZOLAM HCL 5 MG/5ML IJ SOLN
INTRAMUSCULAR | Status: DC | PRN
  Administered 2018-01-07: 2 mg via INTRAVENOUS

## 2018-01-07 MED ORDER — SODIUM CHLORIDE FLUSH 0.9 % IV SOLN
INTRAVENOUS | Status: AC
  Filled 2018-01-07: qty 10

## 2018-01-07 MED ORDER — FENTANYL CITRATE (PF) 100 MCG/2ML IJ SOLN
INTRAMUSCULAR | Status: DC | PRN
  Administered 2018-01-07 (×3): 25 ug via INTRAVENOUS

## 2018-01-07 MED ORDER — LIDOCAINE HCL (PF) 2 % IJ SOLN
INTRAMUSCULAR | Status: AC
  Filled 2018-01-07: qty 10

## 2018-01-07 MED ORDER — ACETAMINOPHEN 500 MG PO TABS: 1000.0000 mg | ORAL_TABLET | Freq: Four times a day (QID) | ORAL | 2 refills | Status: DC | PRN

## 2018-01-07 MED ORDER — SEVOFLURANE IN SOLN
RESPIRATORY_TRACT | Status: AC
  Filled 2018-01-07: qty 250

## 2018-01-07 MED ORDER — VASOPRESSIN 20 UNIT/ML IV SOLN
INTRAVENOUS | Status: DC | PRN
  Administered 2018-01-07: 101 mL via INTRAMUSCULAR

## 2018-01-07 MED ORDER — KETOROLAC TROMETHAMINE 30 MG/ML IJ SOLN
INTRAMUSCULAR | Status: AC
  Filled 2018-01-07: qty 1

## 2018-01-07 MED ORDER — MEPERIDINE HCL 50 MG/ML IJ SOLN: 6.2500 mg | INTRAMUSCULAR | Status: DC | PRN

## 2018-01-07 MED ORDER — FENTANYL CITRATE (PF) 100 MCG/2ML IJ SOLN
25.0000 ug | INTRAMUSCULAR | Status: DC | PRN
  Administered 2018-01-07 (×2): 25 ug via INTRAVENOUS

## 2018-01-07 MED ORDER — FAMOTIDINE 20 MG PO TABS
20.0000 mg | ORAL_TABLET | Freq: Once | ORAL | Status: AC
  Administered 2018-01-07: 20 mg via ORAL

## 2018-01-07 MED ORDER — DEXAMETHASONE SODIUM PHOSPHATE 10 MG/ML IJ SOLN
INTRAMUSCULAR | Status: AC
  Filled 2018-01-07: qty 1

## 2018-01-07 MED ORDER — FAMOTIDINE 20 MG PO TABS
ORAL_TABLET | ORAL | Status: AC
  Administered 2018-01-07: 20 mg via ORAL
  Filled 2018-01-07: qty 1

## 2018-01-07 MED ORDER — DEXAMETHASONE SODIUM PHOSPHATE 10 MG/ML IJ SOLN
INTRAMUSCULAR | Status: DC | PRN
  Administered 2018-01-07: 5 mg via INTRAVENOUS

## 2018-01-07 MED ORDER — PROMETHAZINE HCL 25 MG/ML IJ SOLN
INTRAMUSCULAR | Status: AC
  Administered 2018-01-07: 6.25 mg via INTRAVENOUS
  Filled 2018-01-07: qty 1

## 2018-01-07 MED ORDER — OXYCODONE HCL 5 MG/5ML PO SOLN: 5.0000 mg | Freq: Once | ORAL | Status: DC | PRN

## 2018-01-07 MED ORDER — SODIUM CHLORIDE (PF) 0.9 % IJ SOLN
INTRAMUSCULAR | Status: AC
  Filled 2018-01-07: qty 100

## 2018-01-07 MED ORDER — OXYCODONE HCL 5 MG PO TABS: 5.0000 mg | ORAL_TABLET | Freq: Once | ORAL | Status: DC | PRN

## 2018-01-07 MED ORDER — ONDANSETRON HCL 4 MG/2ML IJ SOLN
INTRAMUSCULAR | Status: DC | PRN
  Administered 2018-01-07: 4 mg via INTRAVENOUS

## 2018-01-07 SURGICAL SUPPLY — 27 items
BAG COUNTER SPONGE EZ (MISCELLANEOUS) ×2 IMPLANT
BASIN GRAD PLASTIC 32OZ STRL (MISCELLANEOUS) ×2 IMPLANT
CANISTER SUC SOCK COL 7IN (MISCELLANEOUS) ×2 IMPLANT
CATH ROBINSON RED A/P 16FR (CATHETERS) ×2 IMPLANT
DEVICE MYOSURE LITE (MISCELLANEOUS) IMPLANT
DEVICE MYOSURE REACH (MISCELLANEOUS) ×6 IMPLANT
DRAPE UNDER BUTTOCK W/FLU (DRAPES) ×2 IMPLANT
ELECT REM PT RETURN 9FT ADLT (ELECTROSURGICAL) ×2
ELECTRODE REM PT RTRN 9FT ADLT (ELECTROSURGICAL) ×1 IMPLANT
GLOVE BIO SURGEON STRL SZ 6.5 (GLOVE) ×2 IMPLANT
GLOVE BIOGEL PI IND STRL 6.5 (GLOVE) ×1 IMPLANT
GLOVE BIOGEL PI INDICATOR 6.5 (GLOVE) ×1
GOWN STRL REUS W/ TWL LRG LVL3 (GOWN DISPOSABLE) ×2 IMPLANT
GOWN STRL REUS W/TWL LRG LVL3 (GOWN DISPOSABLE) ×2
KIT PROCEDURE FLUENT (KITS) ×2 IMPLANT
NDL DEFLUX 3.7X23X350 (MISCELLANEOUS) ×2
NEEDLE DEFLUX 3.7X23X350 (MISCELLANEOUS) ×1 IMPLANT
PACK DNC HYST (MISCELLANEOUS) ×2 IMPLANT
PAD OB MATERNITY 4.3X12.25 (PERSONAL CARE ITEMS) ×2 IMPLANT
PAD PREP 24X41 OB/GYN DISP (PERSONAL CARE ITEMS) ×2 IMPLANT
SOL .9 NS 3000ML IRR  AL (IV SOLUTION) ×5
SOL .9 NS 3000ML IRR UROMATIC (IV SOLUTION) ×5 IMPLANT
STRAP SAFETY 5IN WIDE (MISCELLANEOUS) ×2 IMPLANT
TOWEL OR 17X26 4PK STRL BLUE (TOWEL DISPOSABLE) ×2 IMPLANT
TUBING CONNECTING 10 (TUBING) ×2 IMPLANT
TUBING HYSTEROSCOPY DOLPHIN (MISCELLANEOUS) ×2 IMPLANT
deflux metal needle ×2 IMPLANT

## 2018-01-07 NOTE — Discharge Instructions (Signed)
AMBULATORY SURGERY  DISCHARGE INSTRUCTIONS   1) The drugs that you were given will stay in your system until tomorrow so for the next 24 hours you should not:  A) Drive an automobile B) Make any legal decisions C) Drink any alcoholic beverage   2) You may resume regular meals tomorrow.  Today it is better to start with liquids and gradually work up to solid foods.  You may eat anything you prefer, but it is better to start with liquids, then soup and crackers, and gradually work up to solid foods.   3) Please notify your doctor immediately if you have any unusual bleeding, trouble breathing, redness and pain at the surgery site, drainage, fever, or pain not relieved by medication.    4) Additional Instructions:        Please contact your physician with any problems or Same Day Surgery at [phone], Monday through Friday 6 am to 4 pm, or [place] at [facility] number at [phone].Myosure, Care After Refer to this sheet in the next few weeks. These instructions provide you with information on caring for yourself after your procedure. Your health care provider may also give you more specific instructions. Your treatment has been planned according to current medical practices, but problems sometimes occur. Call your health care provider if you have any problems or questions after your procedure. What can I expect after the procedure? After your procedure, it is typical to have the following:  You may have some cramping. This normally lasts for a couple days.  You may have bleeding. This can vary from light spotting for a few days to menstrual-like bleeding for 3-7 days.  Follow these instructions at home:  Rest for the first 1-2 days after the procedure.  Only take over-the-counter or prescription medicines as directed by your health care provider. Do not take aspirin. It can increase the chances of bleeding.  Take showers instead of baths for 2 weeks or as directed  by your health care provider.  Do not drive for 24 hours or as directed.  Do not drink alcohol while taking pain medicine.  Do not use tampons, douche, or have sexual intercourse for 2 weeks or until your health care provider says it is okay.  Take your temperature twice a day for 4-5 days. Write it down each time.  Follow your health care provider's advice about diet, exercise, and lifting.  If you develop constipation, you may: ? Take a mild laxative if your health care provider approves. ? Add bran foods to your diet. ? Drink enough fluids to keep your urine clear or pale yellow.  Try to have someone with you or available to you for the first 24-48 hours, especially if you were given a general anesthetic.  Follow up with your health care provider as directed. Contact a health care provider if:  You feel dizzy or lightheaded.  You feel sick to your stomach (nauseous).  You have abnormal vaginal discharge.  You have a rash.  You have pain that is not controlled with medicine. Get help right away if:  You have bleeding that is heavier than a normal menstrual period.  You have a fever.  You have increasing cramps or pain, not controlled with medicine.  You have new belly (abdominal) pain.  You pass out.  You have pain in the tops of your shoulders (shoulder strap areas).  You have shortness of breath. This information is not intended to replace advice given to you by  your health care provider. Make sure you discuss any questions you have with your health care provider. Document Released: 12/01/2012 Document Revised: 07/19/2015 Document Reviewed: 09/09/2012 Elsevier Interactive Patient Education  2017 [employer].

## 2018-01-07 NOTE — Op Note (Signed)
Operative Note  01/07/2018  PRE-OP DIAGNOSIS: submucosal fibroid  POST-OP DIAGNOSIS: submucosal fibroid  SURGEON: Christanna Schuman MD  PROCEDURE: Procedure(s): DILATATION & CURETTAGE/HYSTEROSCOPY WITH MYOSURE   ANESTHESIA: Choice   ESTIMATED BLOOD LOSS: less than 10 cc   SPECIMENS:  fibroid  FLUID DEFICIT: 7622 cc  COMPLICATIONS: none  DISPOSITION: PACU - hemodynamically stable.  CONDITION: stable  FINDINGS: Exam under anesthesia revealed small, mobile 12 cm uterus with no masses and bilateral adnexa without masses or fullness. Hysteroscopy revealed a large fundal submucosal fibroid. Normal appearing uterine cavity with bilateral tubal ostia and normal appearing endocervical canal.  PROCEDURE IN DETAIL: After informed consent was obtained, the patient was taken to the operating room where anesthesia was obtained without difficulty. The patient was positioned in the dorsal lithotomy position in [place]. The patient's bladder was catheterized with an in and out foley catheter. The patient was examined under anesthesia, with the above noted findings. The weighted speculum was placed inside the patient's vagina, and the the anterior lip of the cervix was seen and grasped with the tenaculum.  A cervical block was performed by injecting 10 cc of vasopressin. The uterine cavity was sounded to 12 cm, and then the cervix was progressively dilated to a  18 French-Pratt dilator. The 0 degree hysteroscope was introduced, with saline fluid used to distend the intrauterine cavity, with the above noted findings. Under direct hysteroscopy a cystoscopy needle was used to inject 10 cc of diluted vasopressin into the fibroid.   The myosure was then used to remove the fibroid. The fibroid was large and calcified. Three different myosure devices were used to remove the fibroid. The fibroid was not able to be removed in full. About 50 % of the fibroid was removed. The procedure was concluded because  of the fluid deficit loss. At the end of the procedure excellent hemostasis was noted, and all instruments were removed, with excellent hemostasis noted throughout. She was then taken out of dorsal lithotomy.   The patient tolerated the procedure well. Sponge, lap and needle counts were correct x2. The patient was taken to recovery room in excellent condition.  Adrian Prows MD [facility] 01/07/2018  8:11 PM
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
## 2018-01-07 NOTE — Anesthesia Post-op Follow-up Note (Signed)
Anesthesia QCDR form completed.        

## 2018-01-07 NOTE — Transfer of Care (Signed)
Immediate Anesthesia Transfer of Care Note  Patient: Catherine Christensen  Procedure(s) Performed: DILATATION & CURETTAGE/HYSTEROSCOPY WITH MYOSURE (N/A )  Patient Location: PACU  Anesthesia Type:General  Level of Consciousness: sedated  Airway & Oxygen Therapy: Patient connected to face mask oxygen  Post-op Assessment: Post -op Vital signs reviewed and stable  Post vital signs: stable  Last Vitals:  Vitals Value Taken Time  BP 167/93 01/07/2018  8:11 PM  Temp    Pulse 66 01/07/2018  8:12 PM  Resp 16 01/07/2018  8:12 PM  SpO2 100 % 01/07/2018  8:12 PM  Vitals shown include unvalidated device data.  Last Pain:  Vitals:   01/07/18 1436  TempSrc: Oral         Complications: No apparent anesthesia complications
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
## 2018-01-07 NOTE — Anesthesia Procedure Notes (Signed)
Procedure Name: LMA Insertion Date/Time: 01/07/2018 6:16 PM Performed by: Dionne Bucy, CRNA Pre-anesthesia Checklist: Patient identified, Patient being monitored, Timeout performed, Emergency Drugs available and Suction available Patient Re-evaluated:Patient Re-evaluated prior to induction Oxygen Delivery Method: Circle system utilized Preoxygenation: Pre-oxygenation with 100% oxygen Induction Type: IV induction Ventilation: Mask ventilation without difficulty LMA: LMA inserted LMA Size: 4.0 Tube type: Oral Number of attempts: 1 Placement Confirmation: positive ETCO2 and breath sounds checked- equal and bilateral Tube secured with: Tape Dental Injury: Teeth and Oropharynx as per pre-operative assessment

## 2018-01-08 ENCOUNTER — Telehealth: Payer: Self-pay | Admitting: Obstetrics and Gynecology

## 2018-01-08 ENCOUNTER — Encounter: Payer: Self-pay | Admitting: Obstetrics and Gynecology

## 2018-01-08 LAB — ABO/RH: ABO/RH(D): O POS

## 2018-01-08 NOTE — Telephone Encounter (Signed)
-----   Message from Homero Fellers, MD sent at 01/08/2018 12:41 PM EST ----- Surgery Booking Request Patient Full Name:  Catherine Christensen  MRN: [id number]  DOB: 08-13-1965  Surgeon: Homero Fellers, MD  Requested Surgery Date and Time: 11/19/419 (EARLY CASE TIME PLEASE!!) Primary Diagnosis AND Code: submucosal fibroid Secondary Diagnosis and Code:  Surgical Procedure: myosure, hysteroscopy d&C L&D Notification: No Admission Status: same day surgery Length of Surgery: 1 hour Special Case Needs: myosure XL and myosure rep please H&P: not needed3 (date) Phone Interview???: yes Interpreter: Language:  Medical Clearance: no Special Scheduling Instructions:  myosure rep please, schedule early if possible.  Please talk to me when you get this [name].

## 2018-01-08 NOTE — Telephone Encounter (Signed)
Patient is aware of OR on Tuesday, 01/12/18, and will expect a call from [place] on Monday.

## 2018-01-08 NOTE — Telephone Encounter (Signed)
Thank you :)

## 2018-01-08 NOTE — Anesthesia Postprocedure Evaluation (Signed)
Anesthesia Post Note  Patient: Catherine Christensen  Procedure(s) Performed: DILATATION & CURETTAGE/HYSTEROSCOPY WITH MYOSURE (N/A )  Patient location during evaluation: PACU Anesthesia Type: General Level of consciousness: awake and alert Pain management: pain level controlled Vital Signs Assessment: post-procedure vital signs reviewed and stable Respiratory status: spontaneous breathing, nonlabored ventilation, respiratory function stable and patient connected to nasal cannula oxygen Cardiovascular status: blood pressure returned to baseline and stable Postop Assessment: no apparent nausea or vomiting Anesthetic complications: no     Last Vitals:  Vitals:   01/07/18 2125 01/07/18 2135  BP:  (!) 162/85  Pulse: (!) 58 (!) 58  Resp: 14   Temp:    SpO2: 98% 98%    Last Pain:  Vitals:   01/07/18 2125  TempSrc:   PainSc: 0-No pain                 Precious Haws Cartrell Bentsen

## 2018-01-11 LAB — SURGICAL PATHOLOGY

## 2018-01-12 ENCOUNTER — Encounter: Payer: Self-pay | Admitting: Certified Registered Nurse Anesthetist

## 2018-01-12 ENCOUNTER — Other Ambulatory Visit: Payer: Self-pay

## 2018-01-12 ENCOUNTER — Observation Stay
Admission: RE | Admit: 2018-01-12 | Discharge: 2018-01-13 | Disposition: A | Discharge status: Home / Self Care | Payer: BLUE CROSS/BLUE SHIELD | Source: Ambulatory Visit | Attending: Obstetrics and Gynecology | Admitting: Obstetrics and Gynecology

## 2018-01-12 ENCOUNTER — Ambulatory Visit: Payer: BLUE CROSS/BLUE SHIELD | Admitting: Anesthesiology

## 2018-01-12 ENCOUNTER — Encounter
Admission: RE | Disposition: A | Discharge status: Home / Self Care | Payer: Self-pay | Source: Ambulatory Visit | Attending: Obstetrics and Gynecology

## 2018-01-12 DIAGNOSIS — Z8249 Family history of ischemic heart disease and other diseases of the circulatory system: Secondary | ICD-10-CM | POA: Insufficient documentation

## 2018-01-12 DIAGNOSIS — D250 Submucous leiomyoma of uterus: Secondary | ICD-10-CM | POA: Diagnosis not present

## 2018-01-12 DIAGNOSIS — R1110 Vomiting, unspecified: Secondary | ICD-10-CM | POA: Diagnosis not present

## 2018-01-12 DIAGNOSIS — Z87891 Personal history of nicotine dependence: Secondary | ICD-10-CM | POA: Diagnosis not present

## 2018-01-12 DIAGNOSIS — I10 Essential (primary) hypertension: Secondary | ICD-10-CM | POA: Diagnosis not present

## 2018-01-12 DIAGNOSIS — R51 Headache: Secondary | ICD-10-CM | POA: Diagnosis not present

## 2018-01-12 DIAGNOSIS — D500 Iron deficiency anemia secondary to blood loss (chronic): Secondary | ICD-10-CM | POA: Diagnosis not present

## 2018-01-12 DIAGNOSIS — K219 Gastro-esophageal reflux disease without esophagitis: Secondary | ICD-10-CM | POA: Diagnosis not present

## 2018-01-12 HISTORY — PX: DILATATION & CURETTAGE/HYSTEROSCOPY WITH MYOSURE: SHX6511

## 2018-01-12 LAB — TYPE AND SCREEN
ABO/RH(D): O POS
Antibody Screen: NEGATIVE

## 2018-01-12 LAB — COMPREHENSIVE METABOLIC PANEL
ALBUMIN: 3.2 g/dL — AB (ref 3.5–5.0)
ALT: 35 U/L (ref 0–44)
AST: 48 U/L — AB (ref 15–41)
Alkaline Phosphatase: 33 U/L — ABNORMAL LOW (ref 38–126)
Anion gap: 7 (ref 5–15)
BILIRUBIN TOTAL: 0.4 mg/dL (ref 0.3–1.2)
BUN: 10 mg/dL (ref 6–20)
CHLORIDE: 104 mmol/L (ref 98–111)
CO2: 24 mmol/L (ref 22–32)
CREATININE: 0.65 mg/dL (ref 0.44–1.00)
Calcium: 7.7 mg/dL — ABNORMAL LOW (ref 8.9–10.3)
GFR calc Af Amer: >60 mL/min (ref >60)
GLUCOSE: 124 mg/dL — AB (ref 70–99)
POTASSIUM: 4 mmol/L (ref 3.5–5.1)
Sodium: 135 mmol/L (ref 135–145)
TOTAL PROTEIN: 7.2 g/dL (ref 6.5–8.1)

## 2018-01-12 LAB — CBC
HCT: 33 % — ABNORMAL LOW (ref 36.0–46.0)
HEMATOCRIT: 31.2 % — AB (ref 36.0–46.0)
Hemoglobin: 9.7 g/dL — ABNORMAL LOW (ref 12.0–15.0)
Hemoglobin: 9.3 g/dL — ABNORMAL LOW (ref 12.0–15.0)
MCH: 23.2 pg — ABNORMAL LOW (ref 26.0–34.0)
MCH: 23.4 pg — ABNORMAL LOW (ref 26.0–34.0)
MCHC: 29.4 g/dL — ABNORMAL LOW (ref 30.0–36.0)
MCHC: 29.8 g/dL — ABNORMAL LOW (ref 30.0–36.0)
MCV: 78.8 fL — ABNORMAL LOW (ref 80.0–100.0)
MCV: 78.4 fL — AB (ref 80.0–100.0)
NRBC: 0 % (ref 0.0–0.2)
Platelets: 511 10*3/uL — ABNORMAL HIGH (ref 150–400)
Platelets: 470 10*3/uL — ABNORMAL HIGH (ref 150–400)
RBC: 4.19 MIL/uL (ref 3.87–5.11)
RBC: 3.98 MIL/uL (ref 3.87–5.11)
RDW: 17 % — ABNORMAL HIGH (ref 11.5–15.5)
RDW: 16.9 % — AB (ref 11.5–15.5)
WBC: 5.8 10*3/uL (ref 4.0–10.5)
WBC: 10.8 10*3/uL — AB (ref 4.0–10.5)
nRBC: 0 % (ref 0.0–0.2)

## 2018-01-12 LAB — POCT PREGNANCY, URINE: Preg Test, Ur: NEGATIVE

## 2018-01-12 SURGERY — DILATATION & CURETTAGE/HYSTEROSCOPY WITH MYOSURE
Anesthesia: General | Site: Vagina

## 2018-01-12 MED ORDER — ONDANSETRON HCL 4 MG/2ML IJ SOLN
INTRAMUSCULAR | Status: AC
  Filled 2018-01-12: qty 2

## 2018-01-12 MED ORDER — LIDOCAINE HCL (PF) 2 % IJ SOLN
INTRAMUSCULAR | Status: AC
  Filled 2018-01-12: qty 10

## 2018-01-12 MED ORDER — SODIUM CHLORIDE (PF) 0.9 % IJ SOLN
INTRAMUSCULAR | Status: AC
  Filled 2018-01-12: qty 50

## 2018-01-12 MED ORDER — ACETAMINOPHEN 500 MG PO TABS: 1000.0000 mg | ORAL_TABLET | Freq: Four times a day (QID) | ORAL | Status: DC | PRN

## 2018-01-12 MED ORDER — FENTANYL CITRATE (PF) 100 MCG/2ML IJ SOLN
INTRAMUSCULAR | Status: AC
  Filled 2018-01-12: qty 2

## 2018-01-12 MED ORDER — ONDANSETRON HCL 4 MG/2ML IJ SOLN
INTRAMUSCULAR | Status: DC | PRN
  Administered 2018-01-12: 4 mg via INTRAVENOUS

## 2018-01-12 MED ORDER — SILVER NITRATE-POT NITRATE 75-25 % EX MISC
CUTANEOUS | Status: AC
  Filled 2018-01-12: qty 1

## 2018-01-12 MED ORDER — MIDAZOLAM HCL 2 MG/2ML IJ SOLN
INTRAMUSCULAR | Status: AC
  Filled 2018-01-12: qty 2

## 2018-01-12 MED ORDER — FAMOTIDINE 20 MG PO TABS
ORAL_TABLET | ORAL | Status: AC
  Filled 2018-01-12: qty 1

## 2018-01-12 MED ORDER — HYDROMORPHONE HCL 1 MG/ML IJ SOLN
INTRAMUSCULAR | Status: AC
  Administered 2018-01-12: 0.25 mg via INTRAVENOUS
  Filled 2018-01-12: qty 1

## 2018-01-12 MED ORDER — OXYCODONE HCL 5 MG/5ML PO SOLN: 5.0000 mg | Freq: Once | ORAL | Status: DC | PRN

## 2018-01-12 MED ORDER — LACTATED RINGERS IV SOLN
INTRAVENOUS | Status: DC
  Administered 2018-01-12: 13:00:00 via INTRAVENOUS

## 2018-01-12 MED ORDER — FENTANYL CITRATE (PF) 100 MCG/2ML IJ SOLN
INTRAMUSCULAR | Status: DC | PRN
  Administered 2018-01-12 (×6): 25 ug via INTRAVENOUS

## 2018-01-12 MED ORDER — LIDOCAINE HCL (CARDIAC) PF 100 MG/5ML IV SOSY
PREFILLED_SYRINGE | INTRAVENOUS | Status: DC | PRN
  Administered 2018-01-12: 100 mg via INTRAVENOUS

## 2018-01-12 MED ORDER — DEXAMETHASONE SODIUM PHOSPHATE 10 MG/ML IJ SOLN
INTRAMUSCULAR | Status: DC | PRN
  Administered 2018-01-12: 5 mg via INTRAVENOUS

## 2018-01-12 MED ORDER — OXYCODONE HCL 5 MG PO TABS: 5.0000 mg | ORAL_TABLET | Freq: Once | ORAL | Status: DC | PRN

## 2018-01-12 MED ORDER — DEXAMETHASONE SODIUM PHOSPHATE 10 MG/ML IJ SOLN
INTRAMUSCULAR | Status: AC
  Filled 2018-01-12: qty 1

## 2018-01-12 MED ORDER — HYDROMORPHONE HCL 1 MG/ML IJ SOLN
0.2500 mg | INTRAMUSCULAR | Status: DC | PRN
  Administered 2018-01-12 (×6): 0.25 mg via INTRAVENOUS

## 2018-01-12 MED ORDER — MIDAZOLAM HCL 2 MG/2ML IJ SOLN
INTRAMUSCULAR | Status: DC | PRN
  Administered 2018-01-12: 2 mg via INTRAVENOUS

## 2018-01-12 MED ORDER — FENTANYL CITRATE (PF) 100 MCG/2ML IJ SOLN
INTRAMUSCULAR | Status: AC
  Administered 2018-01-12: 50 ug via INTRAVENOUS
  Filled 2018-01-12: qty 2

## 2018-01-12 MED ORDER — PROPOFOL 10 MG/ML IV BOLUS
INTRAVENOUS | Status: AC
  Filled 2018-01-12: qty 20

## 2018-01-12 MED ORDER — OXYCODONE HCL 5 MG PO TABS: 5.0000 mg | ORAL_TABLET | Freq: Four times a day (QID) | ORAL | Status: DC | PRN

## 2018-01-12 MED ORDER — LACTATED RINGERS IV SOLN: INTRAVENOUS | Status: DC

## 2018-01-12 MED ORDER — PROPOFOL 10 MG/ML IV BOLUS
INTRAVENOUS | Status: DC | PRN
  Administered 2018-01-12: 200 mg via INTRAVENOUS

## 2018-01-12 MED ORDER — VASOPRESSIN 20 UNIT/ML IV SOLN
INTRAVENOUS | Status: AC
  Filled 2018-01-12: qty 1

## 2018-01-12 MED ORDER — FENTANYL CITRATE (PF) 100 MCG/2ML IJ SOLN
25.0000 ug | INTRAMUSCULAR | Status: DC | PRN
  Administered 2018-01-12 (×3): 50 ug via INTRAVENOUS

## 2018-01-12 MED ORDER — SODIUM CHLORIDE 0.9 % IV SOLN
INTRAVENOUS | Status: DC
  Administered 2018-01-12: 21:00:00 via INTRAVENOUS

## 2018-01-12 MED ORDER — KETOROLAC TROMETHAMINE 30 MG/ML IJ SOLN
INTRAMUSCULAR | Status: DC | PRN
  Administered 2018-01-12: 30 mg via INTRAVENOUS

## 2018-01-12 MED ORDER — KETOROLAC TROMETHAMINE 30 MG/ML IJ SOLN
INTRAMUSCULAR | Status: AC
  Filled 2018-01-12: qty 1

## 2018-01-12 MED ORDER — SEVOFLURANE IN SOLN
RESPIRATORY_TRACT | Status: AC
  Filled 2018-01-12: qty 250

## 2018-01-12 MED ORDER — FAMOTIDINE 20 MG PO TABS
20.0000 mg | ORAL_TABLET | Freq: Once | ORAL | Status: AC
  Administered 2018-01-12: 20 mg via ORAL

## 2018-01-12 MED ORDER — IBUPROFEN 600 MG PO TABS
600.0000 mg | ORAL_TABLET | Freq: Four times a day (QID) | ORAL | Status: DC
  Administered 2018-01-12 – 2018-01-13 (×3): 600 mg via ORAL
  Filled 2018-01-12 (×3): qty 1

## 2018-01-12 MED ORDER — ONDANSETRON HCL 4 MG/2ML IJ SOLN
4.0000 mg | Freq: Four times a day (QID) | INTRAMUSCULAR | Status: DC | PRN
  Administered 2018-01-12: 4 mg via INTRAVENOUS
  Filled 2018-01-12: qty 2

## 2018-01-12 MED ORDER — VASOPRESSIN 20 UNIT/ML IV SOLN
INTRAVENOUS | Status: DC | PRN
  Administered 2018-01-12: 10 mL via INTRAMUSCULAR

## 2018-01-12 SURGICAL SUPPLY — 29 items
BAG COUNTER SPONGE EZ (MISCELLANEOUS) ×2 IMPLANT
CANISTER SUC SOCK COL 7IN (MISCELLANEOUS) ×2 IMPLANT
CATH ROBINSON RED A/P 16FR (CATHETERS) ×4 IMPLANT
DEVICE MYOSURE LITE (MISCELLANEOUS) IMPLANT
DEVICE MYOSURE REACH (MISCELLANEOUS) IMPLANT
ELECT REM PT RETURN 9FT ADLT (ELECTROSURGICAL) ×2
ELECTRODE REM PT RTRN 9FT ADLT (ELECTROSURGICAL) ×1 IMPLANT
GLOVE BIO SURGEON STRL SZ 6.5 (GLOVE) ×2 IMPLANT
GLOVE BIOGEL PI IND STRL 6.5 (GLOVE) ×1 IMPLANT
GLOVE BIOGEL PI INDICATOR 6.5 (GLOVE) ×1
GOWN STRL REUS W/ TWL LRG LVL3 (GOWN DISPOSABLE) ×2 IMPLANT
GOWN STRL REUS W/TWL LRG LVL3 (GOWN DISPOSABLE) ×2
KIT PROCEDURE FLUENT (KITS) ×2 IMPLANT
MYOSURE XL FIBROID (MISCELLANEOUS) ×4
Myosure XL ×4 IMPLANT
NDL DEFLUX 3.7X23X350 (MISCELLANEOUS) ×2
NEEDLE DEFLUX 3.7X23X350 (MISCELLANEOUS) ×1 IMPLANT
NEEDLE SPNL 22GX3.5 QUINCKE BK (NEEDLE) ×2 IMPLANT
Omnilok ×2 IMPLANT
PACK DNC HYST (MISCELLANEOUS) ×2 IMPLANT
PAD OB MATERNITY 4.3X12.25 (PERSONAL CARE ITEMS) ×2 IMPLANT
PAD PREP 24X41 OB/GYN DISP (PERSONAL CARE ITEMS) ×2 IMPLANT
SOL .9 NS 3000ML IRR  AL (IV SOLUTION) ×3
SOL .9 NS 3000ML IRR UROMATIC (IV SOLUTION) ×3 IMPLANT
STRAP SAFETY 5IN WIDE (MISCELLANEOUS) ×2 IMPLANT
SYSTEM TISS REMOVAL MYOSURE XL (MISCELLANEOUS) ×2 IMPLANT
TOWEL OR 17X26 4PK STRL BLUE (TOWEL DISPOSABLE) ×2 IMPLANT
TUBING CONNECTING 10 (TUBING) ×2 IMPLANT
TUBING HYSTEROSCOPY DOLPHIN (MISCELLANEOUS) ×2 IMPLANT

## 2018-01-12 NOTE — Progress Notes (Signed)
   CHIEF COMPLAINT:  No chief complaint on file.   Subjective  Patient has a headache. She vomited once after surgery. She can only tolerate crackers at this time. She is resting in bed. Breathing comfortably. She has been up to urinate without problem. She is only having small vaginal bleeding.  Objective   Examination:  General exam: Appears calm and comfortable  Respiratory system: Clear to auscultation. Respiratory effort normal. HEENT: [place]/AT, PERRLA, no thrush, no stridor. Cardiovascular system: S1 & S2 heard, RRR. No JVD, murmurs, rubs, gallops or clicks. No pedal edema. Gastrointestinal system: Abdomen is nondistended, soft and nontender. No organomegaly or masses felt. Normal bowel sounds heard. Central nervous system: Alert and oriented. No focal neurological deficits. Extremities: Symmetric 5 x 5 power. Skin: No rashes, lesions or ulcers Psychiatry: Judgement and insight appear normal. Mood & affect appropriate.   VITALS:  oral temperature is 97.7 F (36.5 C). Her blood pressure is 158/88 (abnormal) and her pulse is 65. Her respiration is 18 and oxygen saturation is 99%.   I personally reviewed Labs under Results section.  Assessment/Plan:    Discussed with her option since there has been the surgical failure of the repeat myosure using the myosure XL. We discussed lupron, [place], and abdominal hysterectomy. She will consider her options. She is leaning towards treatment with lupron. Electrolytes are normal at this time. Corrected calcium is 8.34.  Will recheck in the morning.   Continue monitoring for signs of fluid overload.   Code Status: Full  Disposition Plan: Home, pending AM labs  Time spent: 20 minutes at the bedside   LOS: 0 days   [place]
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
## 2018-01-12 NOTE — Progress Notes (Signed)
Son, Jeanann Lewandowsky, to pick up patient when she is ready to be discharged.  [phone]

## 2018-01-12 NOTE — Anesthesia Post-op Follow-up Note (Signed)
Anesthesia QCDR form completed.        
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
## 2018-01-12 NOTE — Anesthesia Procedure Notes (Signed)
Procedure Name: LMA Insertion Date/Time: 01/12/2018 1:43 PM Performed by: Eben Burow, CRNA Pre-anesthesia Checklist: Patient identified, Emergency Drugs available, Suction available, Patient being monitored and Timeout performed Patient Re-evaluated:Patient Re-evaluated prior to induction Oxygen Delivery Method: Circle system utilized Preoxygenation: Pre-oxygenation with 100% oxygen Induction Type: IV induction Ventilation: Mask ventilation without difficulty LMA: LMA inserted LMA Size: 4.0 Number of attempts: 1 Placement Confirmation: positive ETCO2 and breath sounds checked- equal and bilateral Tube secured with: Tape Dental Injury: Teeth and Oropharynx as per pre-operative assessment

## 2018-01-12 NOTE — Transfer of Care (Signed)
Immediate Anesthesia Transfer of Care Note  Patient: Catherine Christensen  Procedure(s) Performed: DILATATION & CURETTAGE/HYSTEROSCOPY WITH MYOSURE (N/A Vagina )  Patient Location: PACU  Anesthesia Type:General  Level of Consciousness: awake, alert  and oriented  Airway & Oxygen Therapy: Patient connected to face mask oxygen  Post-op Assessment: Post -op Vital signs reviewed and stable  Post vital signs: stable  Last Vitals:  Vitals Value Taken Time  BP 160/90 01/12/2018  3:36 PM  Temp    Pulse 66 01/12/2018  3:39 PM  Resp 12 01/12/2018  3:39 PM  SpO2 100 % 01/12/2018  3:39 PM  Vitals shown include unvalidated device data.  Last Pain: There were no vitals filed for this visit.       Complications: No apparent anesthesia complications

## 2018-01-12 NOTE — Op Note (Signed)
Operative Note  01/12/2018  PRE-OP DIAGNOSIS: submucosal fibroid  POST-OP DIAGNOSIS: same   SURGEON: Christl Fessenden MD  PROCEDURE: Procedure(s): DILATATION & CURETTAGE/HYSTEROSCOPY WITH MYOSURE   ANESTHESIA: Choice   ESTIMATED BLOOD LOSS:  10cc   SPECIMENS:  Fragments of fibroid  FLUID DEFICIT: 6295 cc  COMPLICATIONS: none  DISPOSITION: PACU - hemodynamically stable.  CONDITION: stable  FINDINGS: Exam under anesthesia revealed small, mobile 12 cm uterus with no masses and bilateral adnexa without masses or fullness. Hysteroscopy revealed a large submucosal fibroid. Otherwise normal uterine cavity and normal bilateral tubal ostia. Normal appearing endocervical canal.  PROCEDURE IN DETAIL: After informed consent was obtained, the patient was taken to the operating room where anesthesia was obtained without difficulty. The patient was positioned in the dorsal lithotomy position in [place]. The patient's bladder was catheterized with an in and out foley catheter. There was less than 25cc of urine.  The patient was examined under anesthesia, with the above noted findings. The weighted speculum was placed inside the patient's vagina, and the the anterior lip of the cervix was seen and grasped with the tenaculum. The uterine cavity was sounded to 12 cm, and then the cervix was progressively dilated to a 18 French-Pratt dilator. The 0 degree hysteroscope was introduced, with saline fluid used to distend the intrauterine cavity, with the above noted findings. The Myosure device was then used to dissect the uterine fibroid. Despite using two of the Myosure XLs the fibroid was not able to be removed in entirity. The fibroid was very calcified and hardened. The procedure had to be discontinued when the fluid deficit of 2500 cc was reached. All instruments were removed from the vagina.  Excellent hemostasis was noted. The bladder was again in and out catheterized for about 400 cc.   The  patient tolerated the procedure well. Sponge, lap and needle counts were correct x2. The patient was taken to recovery room in excellent condition.  Adrian Prows MD [facility], [facility] 01/12/18 3:47 PM

## 2018-01-13 ENCOUNTER — Encounter: Payer: Self-pay | Admitting: Obstetrics and Gynecology

## 2018-01-13 DIAGNOSIS — Z87891 Personal history of nicotine dependence: Secondary | ICD-10-CM | POA: Diagnosis not present

## 2018-01-13 DIAGNOSIS — Z8249 Family history of ischemic heart disease and other diseases of the circulatory system: Secondary | ICD-10-CM | POA: Diagnosis not present

## 2018-01-13 DIAGNOSIS — I10 Essential (primary) hypertension: Secondary | ICD-10-CM | POA: Diagnosis not present

## 2018-01-13 DIAGNOSIS — R1110 Vomiting, unspecified: Secondary | ICD-10-CM | POA: Diagnosis not present

## 2018-01-13 DIAGNOSIS — R51 Headache: Secondary | ICD-10-CM | POA: Diagnosis not present

## 2018-01-13 DIAGNOSIS — D250 Submucous leiomyoma of uterus: Secondary | ICD-10-CM | POA: Diagnosis not present

## 2018-01-13 LAB — BASIC METABOLIC PANEL
Anion gap: 6 (ref 5–15)
BUN: 10 mg/dL (ref 6–20)
CALCIUM: 7.7 mg/dL — AB (ref 8.9–10.3)
CHLORIDE: 107 mmol/L (ref 98–111)
CO2: 24 mmol/L (ref 22–32)
CREATININE: 0.74 mg/dL (ref 0.44–1.00)
Glucose, Bld: 157 mg/dL — ABNORMAL HIGH (ref 70–99)
Potassium: 3.8 mmol/L (ref 3.5–5.1)
SODIUM: 137 mmol/L (ref 135–145)

## 2018-01-13 LAB — MAGNESIUM: Magnesium: 2 mg/dL (ref 1.7–2.4)

## 2018-01-13 MED ORDER — LEUPROLIDE ACETATE (3 MONTH) 11.25 MG IM KIT: 11.2500 mg | PACK | INTRAMUSCULAR | 1 refills | Status: DC

## 2018-01-13 NOTE — Plan of Care (Signed)
Vs stable; headache "it's gone now"; up out of bed better as the shift has gone on; only 1 emesis this shift; has only taken motrin for pain control; tolerated some regular food; voiding well post surgery

## 2018-01-13 NOTE — Discharge Instructions (Signed)
Leuprolide depot injection What is this medicine? LEUPROLIDE (loo PROE lide) is a man-made protein that acts like a natural hormone in the body. It decreases testosterone in men and decreases estrogen in women. In men, this medicine is used to treat advanced prostate cancer. In women, some forms of this medicine may be used to treat endometriosis, uterine fibroids, or other female hormone-related problems. This medicine may be used for other purposes; ask your health care provider or pharmacist if you have questions. COMMON BRAND NAME(S): Eligard, Lupron Depot, Lupron Depot-Ped, Viadur What should I tell my health care provider before I take this medicine? They need to know if you have any of these conditions: -diabetes -heart disease or previous heart attack -high blood pressure -high cholesterol -mental illness -osteoporosis -pain or difficulty passing urine -seizures -spinal cord metastasis -stroke -suicidal thoughts, plans, or attempt; a previous suicide attempt by you or a family member -tobacco smoker -unusual vaginal bleeding (women) -an unusual or allergic reaction to leuprolide, benzyl alcohol, other medicines, foods, dyes, or preservatives -pregnant or trying to get pregnant -breast-feeding How should I use this medicine? This medicine is for injection into a muscle or for injection under the skin. It is given by a health care professional in a hospital or clinic setting. The specific product will determine how it will be given to you. Make sure you understand which product you receive and how often you will receive it. Talk to your pediatrician regarding the use of this medicine in children. Special care may be needed. Overdosage: If you think you have taken too much of this medicine contact a poison control center or emergency room at once. NOTE: This medicine is only for you. Do not share this medicine with others. What if I miss a dose? It is important not to miss a dose.  Call your doctor or health care professional if you are unable to keep an appointment. Depot injections: Depot injections are given either once-monthly, every 12 weeks, every 16 weeks, or every 24 weeks depending on the product you are prescribed. The product you are prescribed will be based on if you are female or female, and your condition. Make sure you understand your product and dosing. What may interact with this medicine? Do not take this medicine with any of the following medications: -chasteberry This medicine may also interact with the following medications: -herbal or dietary supplements, like black cohosh or DHEA -female hormones, like estrogens or progestins and birth control pills, patches, rings, or injections -female hormones, like testosterone This list may not describe all possible interactions. Give your health care provider a list of all the medicines, herbs, non-prescription drugs, or dietary supplements you use. Also tell them if you smoke, drink alcohol, or use illegal drugs. Some items may interact with your medicine. What should I watch for while using this medicine? Visit your doctor or health care professional for regular checks on your progress. During the first weeks of treatment, your symptoms may get worse, but then will improve as you continue your treatment. You may get hot flashes, increased bone pain, increased difficulty passing urine, or an aggravation of nerve symptoms. Discuss these effects with your doctor or health care professional, some of them may improve with continued use of this medicine. Female patients may experience a menstrual cycle or spotting during the first months of therapy with this medicine. If this continues, contact your doctor or health care professional. What side effects may I notice from receiving this medicine? Side  effects that you should report to your doctor or health care professional as soon as possible: -allergic reactions like skin  rash, itching or hives, swelling of the face, lips, or tongue -breathing problems -chest pain -depression or memory disorders -pain in your legs or groin -pain at site where injected or implanted -seizures -severe headache -swelling of the feet and legs -suicidal thoughts or other mood changes -visual changes -vomiting Side effects that usually do not require medical attention (report to your doctor or health care professional if they continue or are bothersome): -breast swelling or tenderness -decrease in sex drive or performance -diarrhea -hot flashes -loss of appetite -muscle, joint, or bone pains -nausea -redness or irritation at site where injected or implanted -skin problems or acne This list may not describe all possible side effects. Call your doctor for medical advice about side effects. You may report side effects to FDA at 1-800-FDA-1088. Where should I keep my medicine? This drug is given in a hospital or clinic and will not be stored at home. NOTE: This sheet is a summary. It may not cover all possible information. If you have questions about this medicine, talk to your doctor, pharmacist, or health care provider.  2018 Elsevier/Gold Standard (2015-07-26 09:45:53)   Uterine Artery Embolization for Fibroids Uterine artery embolization is a nonsurgical treatment to shrink fibroids. A thin plastic tube (catheter) is used to inject material that blocks off the blood supply to the fibroid, which causes the fibroid to shrink. Tell a health care provider about:  Any allergies you have.  All medicines you are taking, including vitamins, herbs, eye drops, creams, and over-the-counter medicines.  Any problems you or family members have had with anesthetic medicines.  Any blood disorders you have.  Any surgeries you have had.  Any medical conditions you have. What are the risks?  Injury to the uterus from decreased blood supply  Infection.  Blood infection  (septicemia).  Lack of menstrual periods (amenorrhea).  Death of tissue cells (necrosis) around your bladder or vulva.  Development of a hole between organs or from an organ to the surface of your skin (fistula).  Blood clot in the legs (deep vein thrombosis) or lung (pulmonary embolus). What happens before the procedure?  Ask your health care provider about changing or stopping your regular medicines.  Do not take aspirin or blood thinners (anticoagulants) for 1 week before the surgery or as directed by your health care provider.  Do not eat or drink anything for 8 hours before the surgery or as directed by your health care provider.  Empty your bladder before the procedure begins. What happens during the procedure?  An IV tube will be placed into one of your veins. This will be used to give you a sedative and pain medication (conscious sedation).  You will be given a medicine that numbs the area (local anesthetic).  A small cut will be made in your groin. A catheter is then inserted into the main artery of your leg.  The catheter will be guided through the artery to your uterus. A series of images will be taken while dye is injected through the catheter in your groin. X-rays are taken at the same time. This is done to provide a road map of the blood supply to your uterus and fibroids.  Tiny plastic spheres, about the size of sand grains, will be injected through the catheter. Metal coils may be used to help block the artery. The particles will lodge in tiny  branches of the uterine artery that supplies blood to the fibroids.  The procedure is repeated on the artery that supplies the other side of the uterus.  The catheter is then removed and pressure is held to stop any bleeding. No stitches are needed.  A dressing is then placed over the cut (incision). What happens after the procedure?  You will be taken to a recovery area where your progress will be monitored until you are  awake, stable, and taking fluids well. If there are no other problems, you will then be moved to a regular hospital room.  You will be observed overnight in the hospital.  You will have cramping that should be controlled with pain medication. This information is not intended to replace advice given to you by your health care provider. Make sure you discuss any questions you have with your health care provider. Document Released: 04/28/2005 Document Revised: 07/19/2015 Document Reviewed: 08/26/2012 Elsevier Interactive Patient Education  [employer].

## 2018-01-13 NOTE — Final Progress Note (Signed)
   CHIEF COMPLAINT:  No chief complaint on file.   Subjective  Patient is feeling well. No complaints. Tolerating an oral diet. Urinating. Ambulating without difficulty.    Objective   Examination:  General exam: Appears calm and comfortable  Respiratory system: Clear to auscultation. Respiratory effort normal. HEENT: [place]/AT, PERRLA, no thrush, no stridor. Cardiovascular system: S1 & S2 heard, RRR. No JVD, murmurs, rubs, gallops or clicks. No pedal edema. Gastrointestinal system: Abdomen is nondistended, soft and nontender. No organomegaly or masses felt. Normal bowel sounds heard. Central nervous system: Alert and oriented. No focal neurological deficits. Extremities: Symmetric 5 x 5 power. Skin: No rashes, lesions or ulcers Psychiatry: Judgement and insight appear normal. Mood & affect appropriate.   VITALS:  height is 5' 7.5" (1.715 m) and weight is 101.2 kg. Her oral temperature is 98.4 F (36.9 C). Her blood pressure is 147/78 (abnormal) and her pulse is 64. Her respiration is 16 and oxygen saturation is 100%.   I personally reviewed Labs under Results section.  Radiology Reports No results found.   Assessment/Plan:  52 yo with a large calcified submucosal fibroid.  Discussed again with patient option for lupron vs [place] vs Hysterectomy. She would like to try therapy with lupron.  Muranda has significant concerns about her FMLA for work. She states that her job told her she needs to be on FMLA for a year so that she can come to appointments without difficulty or concerns about her work schedule.    Code Status: Full  Disposition Plan: Home  DVT Prophylaxis  Lovenox - Heparin - SCDs    Time spent: More than 30 minutes at bedside   LOS: 0 days   [place]

## 2018-01-13 NOTE — Discharge Summary (Addendum)
Physician Discharge Summary   Patient ID: Catherine Christensen [id number] 52 y.o. Jul 02, 1965  Admit date: 01/12/2018  Discharge date and time: No discharge date for patient encounter.   Admitting Physician: Homero Fellers, MD   Discharge Physician: Adrian Prows MD  Admission Diagnoses: [place]  Discharge Diagnoses: same as above  Admission Condition: good  Discharged Condition: good  Indication for Admission: observation after surgery  Hospital Course: Patient was admitted an observed overnight. Electrolyte studies were normal. Patient feeling well and able to be discharged in the morning.   Consults: None  Significant Diagnostic Studies: labs  Treatments: IV hydration  Discharge Exam: BP (!) 147/78 (BP Location: Left Arm)   Pulse 64   Temp 98.4 F (36.9 C) (Oral)   Resp 16   Ht 5' 7.5" (1.715 m)   Wt 101.2 kg   LMP 12/25/2017 (Exact Date)   SpO2 100%   BMI 34.41 kg/m   General Appearance:    Alert, cooperative, no distress, appears stated age  Head:    Normocephalic, without obvious abnormality, atraumatic  Eyes:    PERRL, conjunctiva/corneas clear, EOM's intact, fundi    benign, both eyes  Ears:    Normal TM's and external ear canals, both ears  Nose:   Nares normal, septum midline, mucosa normal, no drainage    or sinus tenderness  Throat:   Lips, mucosa, and tongue normal; teeth and gums normal  Neck:   Supple, symmetrical, trachea midline, no adenopathy;    thyroid:  no enlargement/tenderness/nodules; no carotid   bruit or JVD  Back:     Symmetric, no curvature, ROM normal, no CVA tenderness  Lungs:     Clear to auscultation bilaterally, respirations unlabored  Chest Wall:    No tenderness or deformity   Heart:    Regular rate and rhythm, S1 and S2 normal, no murmur, rub   or gallop  Breast Exam:    No tenderness, masses, or nipple abnormality  Abdomen:     Soft, non-tender, bowel sounds active all four quadrants,    no masses, no  organomegaly  Genitalia:    Normal female without lesion, discharge or tenderness  Rectal:    Normal tone, normal prostate, no masses or tenderness;   guaiac negative stool  Extremities:   Extremities normal, atraumatic, no cyanosis or edema  Pulses:   2+ and symmetric all extremities  Skin:   Skin color, texture, turgor normal, no rashes or lesions  Lymph nodes:   Cervical, supraclavicular, and axillary nodes normal  Neurologic:   CNII-XII intact, normal strength, sensation and reflexes    throughout    Disposition: Discharge disposition: 01-Home or Self Care       Patient Instructions:  Allergies as of 01/13/2018      Reactions   Feraheme [ferumoxytol] Shortness Of Breath, Other (See Comments)   Patient stated that she experienced back pain, heat all over her body, full body spasms, and increased blood pressure. Shortly thereafter she had an extreme headache.      Medication List    TAKE these medications   acetaminophen 500 MG tablet Commonly known as:  TYLENOL Take 2 tablets (1,000 mg total) by mouth every 6 (six) hours as needed.   ibuprofen 600 MG tablet Commonly known as:  ADVIL,MOTRIN Take 1 tablet (600 mg total) by mouth every 6 (six) hours as needed.   IRON PO Take 1 tablet by mouth daily.   leuprolide 11.25 MG injection Commonly known as:  LUPRON  Inject 11.25 mg into the muscle every 3 (three) months.   multivitamin with minerals Tabs tablet Take 1 tablet by mouth daily.   oxyCODONE 5 MG immediate release tablet Commonly known as:  Oxy IR/ROXICODONE Take 1 tablet (5 mg total) by mouth every 8 (eight) hours as needed.   SPRINTEC 28 0.25-35 MG-MCG tablet Generic drug:  norgestimate-ethinyl estradiol TAKE 1 TABLET BY MOUTH EVERY DAY      Activity: activity as tolerated Diet: regular diet Wound Care: none needed  Follow-up with Dr. Gilman Schmidt in 2 days.  Signed: Homero Fellers 01/13/2018 8:28 AM

## 2018-01-13 NOTE — Progress Notes (Signed)
Provided and reviewed discharge paperwork and prescriptions. Teach back method utilized, pt verbalized understanding as well. Follow up appointment provided. Awaiting pt's son to transport her home.

## 2018-01-14 LAB — SURGICAL PATHOLOGY

## 2018-01-14 NOTE — Anesthesia Postprocedure Evaluation (Signed)
Anesthesia Post Note  Patient: [name]  Procedure(s) Performed: DILATATION & CURETTAGE/HYSTEROSCOPY WITH MYOSURE (N/A Vagina )  Patient location during evaluation: PACU Anesthesia Type: General Level of consciousness: awake and alert Pain management: pain level controlled Vital Signs Assessment: post-procedure vital signs reviewed and stable Respiratory status: spontaneous breathing, nonlabored ventilation, respiratory function stable and patient connected to nasal cannula oxygen Cardiovascular status: blood pressure returned to baseline and stable Postop Assessment: no apparent nausea or vomiting Anesthetic complications: no     Last Vitals:  Vitals:   01/13/18 0729 01/13/18 0730  BP:  (!) 147/78  Pulse:  64  Resp: 16 16  Temp:  36.9 C  SpO2:  100%    Last Pain:  Vitals:   01/13/18 0942  TempSrc:   PainSc: 0-No pain                 Molli Barrows

## 2018-01-15 ENCOUNTER — Encounter: Payer: Self-pay | Admitting: Obstetrics and Gynecology

## 2018-01-15 ENCOUNTER — Ambulatory Visit (INDEPENDENT_AMBULATORY_CARE_PROVIDER_SITE_OTHER): Payer: BLUE CROSS/BLUE SHIELD | Admitting: Obstetrics and Gynecology

## 2018-01-15 VITALS — BP 132/80 | Ht 67.5 in | Wt 220.0 lb

## 2018-01-15 DIAGNOSIS — K5901 Slow transit constipation: Secondary | ICD-10-CM

## 2018-01-15 DIAGNOSIS — D250 Submucous leiomyoma of uterus: Secondary | ICD-10-CM

## 2018-01-15 MED ORDER — POLYETHYLENE GLYCOL 3350 17 G PO PACK: 17.0000 g | PACK | Freq: Every day | ORAL | 0 refills | Status: DC

## 2018-01-15 MED ORDER — MAGNESIUM HYDROXIDE 400 MG/5ML PO SUSP: 5.0000 mL | Freq: Every day | ORAL | 0 refills | Status: DC | PRN

## 2018-01-15 MED ORDER — NORETHINDRONE ACETATE 5 MG PO TABS: 5.0000 mg | ORAL_TABLET | Freq: Every day | ORAL | 6 refills | Status: DC

## 2018-01-15 MED ORDER — METOCLOPRAMIDE HCL 10 MG PO TABS: 10.0000 mg | ORAL_TABLET | Freq: Four times a day (QID) | ORAL | 2 refills | Status: DC | PRN

## 2018-01-15 NOTE — Progress Notes (Signed)
  Postoperative Follow-up Patient presents post op from failed operative hysteroscopy for submucosal fibroid on Thursday of last week and Tuesday of this week.     Subjective: Patient reports some improvement in her preop symptoms. Eating a regular diet without difficulty. Pain is controlled with current analgesics. Medications being used: acetaminophen and ibuprofen (OTC).  Activity: normal activities of daily living. Patient reports additional symptom's since surgery of Irregular bleeding.  Objective: BP 132/80   Ht 5' 7.5" (1.715 m)   Wt 220 lb (99.8 kg)   LMP 12/25/2017 (Exact Date)   BMI 33.95 kg/m  OBGyn Exam  Assessment: s/p : failed myosure operative hysteroscopy stable  Plan: Patient has done well after surgery with no apparent complications.  I have discussed the post-operative course to date, and the expected progress moving forward.   She has been constipated. Will send reglan, miralax, and milk of magnesia. She will take the reglan and miralx first. She will try the milk of magnesia if she is still unable to have a bowel movement.   We discussed lupron vs [place], vs hysterectomy. She would like to try lupron and repeat the hysteroscopy d7c in 6 months to try an shrink the fibroid and soften it. Her insurance has approved the medication. She will be able to pick it up Tuesday. She will return next week for it to be injected. After she starts the lupron she will stop her OCPs and switch to 5 mg or norethindrone daily for add back therapy.      The patient understands what complications to be concerned about.  I will see the patient in 1 month for follow up, or sooner if needed.    Activity plan: No restriction.  Breshay Ilg R Geisha Abernathy 01/15/2018, 2:03 PM      Send progesterone add back  send mirilax Send reglan

## 2018-01-19 ENCOUNTER — Ambulatory Visit: Payer: BLUE CROSS/BLUE SHIELD

## 2018-01-20 ENCOUNTER — Other Ambulatory Visit: Payer: Self-pay | Admitting: *Deleted

## 2018-01-20 ENCOUNTER — Ambulatory Visit (INDEPENDENT_AMBULATORY_CARE_PROVIDER_SITE_OTHER): Payer: BLUE CROSS/BLUE SHIELD

## 2018-01-20 ENCOUNTER — Ambulatory Visit: Payer: BLUE CROSS/BLUE SHIELD

## 2018-01-20 DIAGNOSIS — D509 Iron deficiency anemia, unspecified: Secondary | ICD-10-CM

## 2018-01-20 DIAGNOSIS — D250 Submucous leiomyoma of uterus: Secondary | ICD-10-CM

## 2018-01-20 MED ORDER — LEUPROLIDE ACETATE (3 MONTH) 11.25 MG IM KIT
11.2500 mg | PACK | Freq: Once | INTRAMUSCULAR | Status: AC
  Administered 2018-01-20: 11.25 mg via INTRAMUSCULAR
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
## 2018-01-25 ENCOUNTER — Inpatient Hospital Stay: Payer: BLUE CROSS/BLUE SHIELD

## 2018-01-25 ENCOUNTER — Inpatient Hospital Stay: Payer: BLUE CROSS/BLUE SHIELD | Admitting: Oncology

## 2018-02-12 ENCOUNTER — Encounter: Payer: Self-pay | Admitting: Obstetrics and Gynecology

## 2018-02-12 ENCOUNTER — Ambulatory Visit (INDEPENDENT_AMBULATORY_CARE_PROVIDER_SITE_OTHER): Payer: BLUE CROSS/BLUE SHIELD | Admitting: Obstetrics and Gynecology

## 2018-02-12 VITALS — BP 180/90 | Ht 67.5 in | Wt 224.0 lb

## 2018-02-12 DIAGNOSIS — I10 Essential (primary) hypertension: Secondary | ICD-10-CM

## 2018-02-12 DIAGNOSIS — N939 Abnormal uterine and vaginal bleeding, unspecified: Secondary | ICD-10-CM | POA: Diagnosis not present

## 2018-02-12 MED ORDER — AMLODIPINE BESYLATE 5 MG PO TABS: 5.0000 mg | ORAL_TABLET | Freq: Every day | ORAL | 1 refills | Status: DC
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
## 2018-02-26 ENCOUNTER — Encounter: Payer: Self-pay | Admitting: Obstetrics and Gynecology

## 2018-02-26 ENCOUNTER — Ambulatory Visit (INDEPENDENT_AMBULATORY_CARE_PROVIDER_SITE_OTHER): Payer: BLUE CROSS/BLUE SHIELD | Admitting: Obstetrics and Gynecology

## 2018-02-26 VITALS — BP 160/84 | HR 93 | Ht 67.5 in | Wt 223.0 lb

## 2018-02-26 DIAGNOSIS — I10 Essential (primary) hypertension: Secondary | ICD-10-CM | POA: Diagnosis not present

## 2018-02-26 DIAGNOSIS — E669 Obesity, unspecified: Secondary | ICD-10-CM

## 2018-02-26 DIAGNOSIS — D250 Submucous leiomyoma of uterus: Secondary | ICD-10-CM

## 2018-02-26 MED ORDER — AMLODIPINE BESYLATE 10 MG PO TABS: 10.0000 mg | ORAL_TABLET | Freq: Every day | ORAL | 1 refills | Status: DC
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
## 2018-02-28 HISTORY — PX: BREAST BIOPSY: SHX20

## 2018-03-07 ENCOUNTER — Other Ambulatory Visit: Payer: Self-pay | Admitting: Obstetrics and Gynecology

## 2018-03-07 DIAGNOSIS — I10 Essential (primary) hypertension: Secondary | ICD-10-CM

## 2018-03-15 ENCOUNTER — Encounter: Payer: Self-pay | Admitting: Obstetrics and Gynecology

## 2018-03-15 ENCOUNTER — Ambulatory Visit (INDEPENDENT_AMBULATORY_CARE_PROVIDER_SITE_OTHER): Payer: BLUE CROSS/BLUE SHIELD | Admitting: Obstetrics and Gynecology

## 2018-03-15 VITALS — BP 124/58 | HR 104 | Ht 67.5 in | Wt 222.0 lb

## 2018-03-15 DIAGNOSIS — N939 Abnormal uterine and vaginal bleeding, unspecified: Secondary | ICD-10-CM

## 2018-03-15 DIAGNOSIS — D250 Submucous leiomyoma of uterus: Secondary | ICD-10-CM

## 2018-03-15 DIAGNOSIS — I10 Essential (primary) hypertension: Secondary | ICD-10-CM | POA: Diagnosis not present
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
## 2018-03-21 ENCOUNTER — Other Ambulatory Visit: Payer: Self-pay | Admitting: Obstetrics and Gynecology

## 2018-03-21 DIAGNOSIS — I10 Essential (primary) hypertension: Secondary | ICD-10-CM

## 2018-04-02 ENCOUNTER — Other Ambulatory Visit: Payer: Self-pay | Admitting: Family Medicine

## 2018-04-02 DIAGNOSIS — N920 Excessive and frequent menstruation with regular cycle: Secondary | ICD-10-CM

## 2018-04-02 NOTE — Telephone Encounter (Signed)
Refill request for general medication. Sprintec to [facility]  Last office visit  11/05/2017   Follow up on 05/06/2018

## 2018-04-16 ENCOUNTER — Ambulatory Visit (INDEPENDENT_AMBULATORY_CARE_PROVIDER_SITE_OTHER): Payer: BLUE CROSS/BLUE SHIELD

## 2018-04-16 VITALS — BP 130/78

## 2018-04-16 DIAGNOSIS — D250 Submucous leiomyoma of uterus: Secondary | ICD-10-CM

## 2018-04-16 MED ORDER — LEUPROLIDE ACETATE (3 MONTH) 11.25 MG IM KIT
11.2500 mg | PACK | Freq: Once | INTRAMUSCULAR | Status: AC
  Administered 2018-04-16: 11.25 mg via INTRAMUSCULAR

## 2018-04-16 NOTE — Progress Notes (Signed)
Patient presents for second Lupron Depot injection today. Given IM LUOQ. Patient tolerated well.

## 2018-04-19 ENCOUNTER — Telehealth: Payer: Self-pay

## 2018-04-19 NOTE — Telephone Encounter (Signed)
Pt calling to see what type of shot she got Friday and what type BP med she is on.  [phone]  Called pt to give her the answers which are lupron depot and amlodipine.  Pt states someone else has called and given information.

## 2018-04-24 ENCOUNTER — Other Ambulatory Visit: Payer: Self-pay | Admitting: Obstetrics and Gynecology

## 2018-04-24 DIAGNOSIS — I10 Essential (primary) hypertension: Secondary | ICD-10-CM

## 2018-04-26 NOTE — Telephone Encounter (Signed)
Please advise 

## 2018-05-06 ENCOUNTER — Ambulatory Visit: Payer: BLUE CROSS/BLUE SHIELD | Admitting: Family Medicine

## 2018-05-07 ENCOUNTER — Other Ambulatory Visit: Payer: Self-pay

## 2018-05-07 ENCOUNTER — Encounter: Payer: Self-pay | Admitting: Family Medicine

## 2018-05-07 ENCOUNTER — Ambulatory Visit: Payer: BLUE CROSS/BLUE SHIELD | Admitting: Family Medicine

## 2018-05-07 VITALS — BP 120/78 | HR 98 | Temp 98.6°F | Resp 14 | Ht 68.0 in | Wt 224.2 lb

## 2018-05-07 DIAGNOSIS — R7309 Other abnormal glucose: Secondary | ICD-10-CM | POA: Diagnosis not present

## 2018-05-07 DIAGNOSIS — F325 Major depressive disorder, single episode, in full remission: Secondary | ICD-10-CM | POA: Diagnosis not present

## 2018-05-07 DIAGNOSIS — I10 Essential (primary) hypertension: Secondary | ICD-10-CM | POA: Diagnosis not present

## 2018-05-07 DIAGNOSIS — N939 Abnormal uterine and vaginal bleeding, unspecified: Secondary | ICD-10-CM

## 2018-05-07 DIAGNOSIS — E669 Obesity, unspecified: Secondary | ICD-10-CM

## 2018-05-07 DIAGNOSIS — K5909 Other constipation: Secondary | ICD-10-CM

## 2018-05-07 DIAGNOSIS — D250 Submucous leiomyoma of uterus: Secondary | ICD-10-CM

## 2018-05-07 DIAGNOSIS — Z13220 Encounter for screening for lipoid disorders: Secondary | ICD-10-CM

## 2018-05-07 MED ORDER — AMLODIPINE BESYLATE 10 MG PO TABS: 10.0000 mg | ORAL_TABLET | Freq: Every day | ORAL | 1 refills | Status: DC
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
## 2018-05-08 LAB — HEMOGLOBIN A1C
Hgb A1c MFr Bld: 6.3 % of total Hgb — ABNORMAL HIGH (ref <5.7)
Mean Plasma Glucose: 134 (calc)
eAG (mmol/L): 7.4 (calc)

## 2018-05-08 LAB — CBC WITH DIFFERENTIAL/PLATELET
Absolute Monocytes: 377 cells/uL (ref 200–950)
Basophils Absolute: 20 cells/uL (ref 0–200)
Basophils Relative: 0.4 %
Eosinophils Absolute: 108 cells/uL (ref 15–500)
Eosinophils Relative: 2.2 %
HEMATOCRIT: 33.7 % — AB (ref 35.0–45.0)
Hemoglobin: 10.5 g/dL — ABNORMAL LOW (ref 11.7–15.5)
Lymphs Abs: 1901 cells/uL (ref 850–3900)
MCH: 24.4 pg — ABNORMAL LOW (ref 27.0–33.0)
MCHC: 31.2 g/dL — ABNORMAL LOW (ref 32.0–36.0)
MCV: 78.4 fL — ABNORMAL LOW (ref 80.0–100.0)
MPV: 10.1 fL (ref 7.5–12.5)
Monocytes Relative: 7.7 %
NEUTROS PCT: 50.9 %
Neutro Abs: 2494 cells/uL (ref 1500–7800)
Platelets: 457 10*3/uL — ABNORMAL HIGH (ref 140–400)
RBC: 4.3 10*6/uL (ref 3.80–5.10)
RDW: 17.4 % — ABNORMAL HIGH (ref 11.0–15.0)
Total Lymphocyte: 38.8 %
WBC: 4.9 10*3/uL (ref 3.8–10.8)

## 2018-05-08 LAB — BASIC METABOLIC PANEL WITH GFR
BUN: 15 mg/dL (ref 7–25)
CO2: 25 mmol/L (ref 20–32)
Calcium: 9 mg/dL (ref 8.6–10.4)
Chloride: 108 mmol/L (ref 98–110)
Creat: 0.78 mg/dL (ref 0.50–1.05)
GFR, Est African American: 101 mL/min/{1.73_m2} (ref >=60)
GFR, Est Non African American: 87 mL/min/{1.73_m2} (ref >=60)
GLUCOSE: 132 mg/dL — AB (ref 65–99)
Potassium: 4.2 mmol/L (ref 3.5–5.3)
Sodium: 141 mmol/L (ref 135–146)

## 2018-05-08 LAB — LIPID PANEL
Cholesterol: 176 mg/dL (ref <200)
HDL: 35 mg/dL — ABNORMAL LOW (ref >=50)
LDL CHOLESTEROL (CALC): 125 mg/dL — AB
Non-HDL Cholesterol (Calc): 141 mg/dL (calc) — ABNORMAL HIGH (ref <130)
Total CHOL/HDL Ratio: 5 (calc) — ABNORMAL HIGH (ref <5.0)
Triglycerides: 72 mg/dL (ref <150)

## 2018-05-10 ENCOUNTER — Encounter: Payer: Self-pay | Admitting: Family Medicine

## 2018-05-10 DIAGNOSIS — R7303 Prediabetes: Secondary | ICD-10-CM | POA: Insufficient documentation

## 2018-05-18 ENCOUNTER — Telehealth: Payer: Self-pay | Admitting: Emergency Medicine

## 2018-05-18 DIAGNOSIS — R7303 Prediabetes: Secondary | ICD-10-CM

## 2018-05-18 MED ORDER — METFORMIN HCL ER 750 MG PO TB24: 750.0000 mg | ORAL_TABLET | Freq: Every day | ORAL | 1 refills | Status: DC

## 2018-05-18 NOTE — Telephone Encounter (Signed)
Per Lab results. Please call in the Metformin that you suggested for prediabeties

## 2018-05-19 ENCOUNTER — Telehealth: Payer: Self-pay

## 2018-05-19 MED ORDER — METFORMIN HCL ER 500 MG PO TB24: ORAL_TABLET | ORAL | 1 refills | Status: DC

## 2018-05-19 NOTE — Telephone Encounter (Signed)
[facility] states Metformin HCL ER 750 mg tablet are unavailable and asking we send in a new prescription.

## 2018-06-14 ENCOUNTER — Other Ambulatory Visit: Payer: Self-pay | Admitting: Obstetrics and Gynecology

## 2018-06-14 DIAGNOSIS — D250 Submucous leiomyoma of uterus: Secondary | ICD-10-CM

## 2018-06-30 ENCOUNTER — Other Ambulatory Visit: Payer: Self-pay | Admitting: Obstetrics and Gynecology

## 2018-07-07 ENCOUNTER — Ambulatory Visit: Payer: BLUE CROSS/BLUE SHIELD

## 2018-07-13 ENCOUNTER — Other Ambulatory Visit: Payer: Self-pay

## 2018-07-13 ENCOUNTER — Ambulatory Visit (INDEPENDENT_AMBULATORY_CARE_PROVIDER_SITE_OTHER): Payer: BLUE CROSS/BLUE SHIELD

## 2018-07-13 DIAGNOSIS — D250 Submucous leiomyoma of uterus: Secondary | ICD-10-CM | POA: Diagnosis not present

## 2018-07-13 MED ORDER — LEUPROLIDE ACETATE (3 MONTH) 11.25 MG IM KIT
11.2500 mg | PACK | Freq: Once | INTRAMUSCULAR | Status: AC
  Administered 2018-07-13: 14:00:00 11.25 mg via INTRAMUSCULAR

## 2018-07-13 NOTE — Progress Notes (Signed)
Patient presents today for Lupron Depot Provera injection within dates. Given IM RUOQ. Patient tolerated well.

## 2018-07-15 ENCOUNTER — Telehealth: Payer: Self-pay

## 2018-07-15 NOTE — Telephone Encounter (Signed)
Pt called to find out the name of her iron Allergie, we have  Feraheme in her chart.

## 2018-08-16 ENCOUNTER — Telehealth: Payer: Self-pay

## 2018-08-16 NOTE — Telephone Encounter (Signed)
Pt calling; would like [facility] to call her on 6/23.  [phone]

## 2018-08-17 NOTE — Telephone Encounter (Signed)
Please advise. Thank you

## 2018-08-17 NOTE — Telephone Encounter (Signed)
Pt returning call.  Please call her back.  [phone].

## 2018-08-17 NOTE — Telephone Encounter (Signed)
Please advise 

## 2018-08-17 NOTE — Telephone Encounter (Signed)
I returned call, no answer. I could not leave voicemail because it was not set up. If she returns call I am happy to speak with her.

## 2018-08-17 NOTE — Telephone Encounter (Signed)
Returned call. Patient has completed 3 Lupron 3 month injections. Her bleeding has improved so that it is lighter and not every day. She still has many days with bleeding though. She continues to wish to not have a hysterectomy. She would like to repeat myosure in August. Note sent to [facility] to schedule.

## 2018-08-20 ENCOUNTER — Telehealth: Payer: Self-pay | Admitting: Obstetrics and Gynecology

## 2018-08-20 DIAGNOSIS — Z20828 Contact with and (suspected) exposure to other viral communicable diseases: Secondary | ICD-10-CM | POA: Diagnosis not present

## 2018-08-20 DIAGNOSIS — Z87891 Personal history of nicotine dependence: Secondary | ICD-10-CM | POA: Diagnosis not present

## 2018-08-20 NOTE — Telephone Encounter (Signed)
Patient is aware of H&P on 10/11/18 @ 4:10pm, Pre-admit testing phone interview to be scheduled, COVID testing on 10/15/18 @ 8-10:30am, and OR on 8//25/20. Patient is aware she will be asked to quarantine after COVID testing. Patient is aware of phone check-in/ mask/ no visitors at [place].

## 2018-08-20 NOTE — Telephone Encounter (Signed)
-----   Message from Homero Fellers, MD sent at 08/17/2018  5:00 PM EDT ----- Surgery Booking Request Patient Full Name:  No patient name on file.  MRN: No patient ID available  DOB: There is no date of birth on file.  Surgeon: Homero Fellers, MD  Requested Surgery Date and Time: August 2020 Primary Diagnosis AND Code: submucosal fibroid Secondary Diagnosis and Code:  Surgical Procedure: Hysteroscopy and myosure L&D Notification: No Admission Status: same day surgery Length of Surgery: 1 hour Special Case Needs: myosure rep H&P: TBD (date) Phone Interview???: yes Interpreter: Language:  Medical Clearance: no Special Scheduling Instructions: none Acuity: P3

## 2018-09-21 ENCOUNTER — Telehealth: Payer: Self-pay | Admitting: Obstetrics and Gynecology

## 2018-09-21 NOTE — Telephone Encounter (Signed)
Thank you :)

## 2018-09-21 NOTE — Telephone Encounter (Signed)
Patient is aware of rescheduled H&P on 10/05/18 @ 11:30am w/ Dr. Gilman Schmidt, due to Dr. Gennette Pac schedule change.

## 2018-10-05 ENCOUNTER — Other Ambulatory Visit: Payer: Self-pay

## 2018-10-05 ENCOUNTER — Encounter: Payer: Self-pay | Admitting: Obstetrics and Gynecology

## 2018-10-05 ENCOUNTER — Encounter: Payer: BC Managed Care – PPO | Admitting: Obstetrics and Gynecology

## 2018-10-05 ENCOUNTER — Ambulatory Visit (INDEPENDENT_AMBULATORY_CARE_PROVIDER_SITE_OTHER): Payer: BC Managed Care – PPO | Admitting: Obstetrics and Gynecology

## 2018-10-05 VITALS — BP 130/80 | Ht 67.5 in | Wt 226.0 lb

## 2018-10-05 DIAGNOSIS — D250 Submucous leiomyoma of uterus: Secondary | ICD-10-CM
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
## 2018-10-11 ENCOUNTER — Encounter: Payer: BC Managed Care – PPO | Admitting: Obstetrics and Gynecology

## 2018-10-12 ENCOUNTER — Encounter
Admission: RE | Admit: 2018-10-12 | Discharge: 2018-10-12 | Disposition: A | Discharge status: Home / Self Care | Payer: BC Managed Care – PPO | Source: Ambulatory Visit | Attending: Obstetrics and Gynecology | Admitting: Obstetrics and Gynecology

## 2018-10-12 ENCOUNTER — Other Ambulatory Visit: Payer: Self-pay

## 2018-10-12 HISTORY — DX: Prediabetes: R73.03

## 2018-10-12 NOTE — Patient Instructions (Signed)
Your procedure is scheduled on: Tues 8/25 Report to [place] To find out your arrival time please call [phone] between 1PM - 3PM on .  Remember: Instructions that are not followed completely may result in serious medical risk,  up to and including death, or upon the discretion of your surgeon and anesthesiologist your  surgery may need to be rescheduled.     _X__ 1. Do not eat food after midnight the night before your procedure.                 No gum chewing or hard candies. Complete your G 2  2 hours                 before you are scheduled to arrive for your surgery- DO not drink clear                 liquids within 2 hours of the start of your surgery.  You may also have                  water, Black Coffee or Tea (Do not add cream                  to coffee or tea).  __X__2.  On the morning of surgery brush your teeth with toothpaste and water, you                may rinse your mouth with mouthwash if you wish.  Do not swallow any toothpaste of mouthwash.     _X__ 3.  No Alcohol for 24 hours before or after surgery.   ___ 4.  Do Not Smoke or use e-cigarettes For 24 Hours Prior to Your Surgery.                 Do not use any chewable tobacco products for at least 6 hours prior to                 surgery.  ____  5.  Bring all medications with you on the day of surgery if instructed.   _x___  6.  Notify your doctor if there is any change in your medical condition      (cold, fever, infections).     Do not wear jewelry, make-up, hairpins, clips or nail polish. Do not wear lotions, powders, or perfumes. You may wear deodorant. Do not shave 48 hours prior to surgery. Men may shave face and neck. Do not bring valuables to the hospital.    [facility] is not responsible for any belongings or valuables.  Contacts, dentures or bridgework may not be worn into surgery. Leave your suitcase in the car. After surgery it may be brought to your  room. For patients admitted to the hospital, discharge time is determined by your treatment team.   Patients discharged the day of surgery will not be allowed to drive home.   Please read over the following fact sheets that you were given:    _x___ Take these medicines the morning of surgery with A SIP OF WATER:    1. amLODipine (NORVASC) 10 MG tablet  2.   3.   4.  5.  6.  ____ Fleet Enema (as directed)   ____ Use CHG Soap as directed  ____ Use inhalers on the day of surgery  __x__ Stop metformin 2 days prior to surgery Last dose Sat  8/22   ____ Take 1/2 of usual  insulin dose the night before surgery. No insulin the morning          of surgery.   ____ Stop Coumadin/Plavix/aspirin on   __x__ Stop Anti-inflammatories Ibuprofen Aleve or Aspirin today   May take tylenol   ____ Stop supplements until after surgery.    ____ Bring C-Pap to the hospital.

## 2018-10-15 ENCOUNTER — Other Ambulatory Visit
Admission: RE | Admit: 2018-10-15 | Discharge: 2018-10-15 | Disposition: A | Discharge status: Home / Self Care | Payer: BC Managed Care – PPO | Source: Ambulatory Visit | Attending: Obstetrics and Gynecology | Admitting: Obstetrics and Gynecology

## 2018-10-15 ENCOUNTER — Other Ambulatory Visit: Payer: Self-pay

## 2018-10-15 ENCOUNTER — Encounter
Admission: RE | Admit: 2018-10-15 | Discharge: 2018-10-15 | Disposition: A | Discharge status: Home / Self Care | Payer: BC Managed Care – PPO | Source: Ambulatory Visit | Attending: Obstetrics and Gynecology | Admitting: Obstetrics and Gynecology

## 2018-10-15 DIAGNOSIS — I10 Essential (primary) hypertension: Secondary | ICD-10-CM | POA: Diagnosis not present

## 2018-10-15 DIAGNOSIS — Z01818 Encounter for other preprocedural examination: Secondary | ICD-10-CM | POA: Insufficient documentation

## 2018-10-15 DIAGNOSIS — Z20828 Contact with and (suspected) exposure to other viral communicable diseases: Secondary | ICD-10-CM | POA: Insufficient documentation

## 2018-10-15 LAB — BASIC METABOLIC PANEL
Anion gap: 4 — ABNORMAL LOW (ref 5–15)
BUN: 14 mg/dL (ref 6–20)
CO2: 23 mmol/L (ref 22–32)
Calcium: 8.9 mg/dL (ref 8.9–10.3)
Chloride: 110 mmol/L (ref 98–111)
Creatinine, Ser: 0.86 mg/dL (ref 0.44–1.00)
GFR calc Af Amer: >60 mL/min (ref >60)
GFR calc non Af Amer: >60 mL/min (ref >60)
Glucose, Bld: 100 mg/dL — ABNORMAL HIGH (ref 70–99)
Potassium: 3.8 mmol/L (ref 3.5–5.1)
Sodium: 137 mmol/L (ref 135–145)

## 2018-10-15 LAB — TYPE AND SCREEN
ABO/RH(D): O POS
Antibody Screen: NEGATIVE

## 2018-10-15 LAB — CBC
HCT: 39.4 % (ref 36.0–46.0)
Hemoglobin: 12.3 g/dL (ref 12.0–15.0)
MCH: 26.2 pg (ref 26.0–34.0)
MCHC: 31.2 g/dL (ref 30.0–36.0)
MCV: 84 fL (ref 80.0–100.0)
Platelets: 383 10*3/uL (ref 150–400)
RBC: 4.69 MIL/uL (ref 3.87–5.11)
RDW: 14.4 % (ref 11.5–15.5)
WBC: 3.8 10*3/uL — ABNORMAL LOW (ref 4.0–10.5)
nRBC: 0 % (ref 0.0–0.2)

## 2018-10-15 LAB — SARS CORONAVIRUS 2 (TAT 6-24 HRS): SARS Coronavirus 2: NEGATIVE

## 2018-10-19 ENCOUNTER — Other Ambulatory Visit: Payer: Self-pay

## 2018-10-19 ENCOUNTER — Encounter
Admission: RE | Disposition: A | Discharge status: Home / Self Care | Payer: Self-pay | Source: Home / Self Care | Attending: Obstetrics and Gynecology

## 2018-10-19 ENCOUNTER — Ambulatory Visit
Admission: RE | Admit: 2018-10-19 | Discharge: 2018-10-19 | Disposition: A | Discharge status: Home / Self Care | Payer: BC Managed Care – PPO | Attending: Obstetrics and Gynecology | Admitting: Obstetrics and Gynecology

## 2018-10-19 ENCOUNTER — Encounter: Payer: Self-pay | Admitting: *Deleted

## 2018-10-19 ENCOUNTER — Ambulatory Visit: Payer: BC Managed Care – PPO | Admitting: Anesthesiology

## 2018-10-19 DIAGNOSIS — I10 Essential (primary) hypertension: Secondary | ICD-10-CM | POA: Diagnosis not present

## 2018-10-19 DIAGNOSIS — D250 Submucous leiomyoma of uterus: Secondary | ICD-10-CM | POA: Diagnosis not present

## 2018-10-19 DIAGNOSIS — E669 Obesity, unspecified: Secondary | ICD-10-CM | POA: Diagnosis not present

## 2018-10-19 DIAGNOSIS — D129 Benign neoplasm of anus and anal canal: Secondary | ICD-10-CM | POA: Diagnosis not present

## 2018-10-19 DIAGNOSIS — Z793 Long term (current) use of hormonal contraceptives: Secondary | ICD-10-CM | POA: Insufficient documentation

## 2018-10-19 DIAGNOSIS — Z87891 Personal history of nicotine dependence: Secondary | ICD-10-CM | POA: Insufficient documentation

## 2018-10-19 DIAGNOSIS — D649 Anemia, unspecified: Secondary | ICD-10-CM | POA: Insufficient documentation

## 2018-10-19 DIAGNOSIS — Z79899 Other long term (current) drug therapy: Secondary | ICD-10-CM | POA: Diagnosis not present

## 2018-10-19 DIAGNOSIS — Z7984 Long term (current) use of oral hypoglycemic drugs: Secondary | ICD-10-CM | POA: Insufficient documentation

## 2018-10-19 DIAGNOSIS — R7303 Prediabetes: Secondary | ICD-10-CM | POA: Diagnosis not present

## 2018-10-19 HISTORY — PX: HYSTEROSCOPY: SHX211

## 2018-10-19 LAB — GLUCOSE, CAPILLARY: Glucose-Capillary: 89 mg/dL (ref 70–99)

## 2018-10-19 LAB — POCT PREGNANCY, URINE: Preg Test, Ur: NEGATIVE

## 2018-10-19 SURGERY — HYSTEROSCOPY
Anesthesia: General

## 2018-10-19 MED ORDER — PROPOFOL 10 MG/ML IV BOLUS
INTRAVENOUS | Status: DC | PRN
  Administered 2018-10-19: 150 mg via INTRAVENOUS
  Administered 2018-10-19: 50 mg via INTRAVENOUS

## 2018-10-19 MED ORDER — MEPERIDINE HCL 50 MG/ML IJ SOLN: 6.2500 mg | INTRAMUSCULAR | Status: DC | PRN

## 2018-10-19 MED ORDER — SODIUM CHLORIDE (PF) 0.9 % IJ SOLN
INTRAMUSCULAR | Status: AC
  Filled 2018-10-19: qty 100

## 2018-10-19 MED ORDER — FENTANYL CITRATE (PF) 100 MCG/2ML IJ SOLN
25.0000 ug | INTRAMUSCULAR | Status: DC | PRN
  Administered 2018-10-19 (×2): 25 ug via INTRAVENOUS

## 2018-10-19 MED ORDER — SUGAMMADEX SODIUM 200 MG/2ML IV SOLN
INTRAVENOUS | Status: DC | PRN
  Administered 2018-10-19: 200 mg via INTRAVENOUS

## 2018-10-19 MED ORDER — FENTANYL CITRATE (PF) 100 MCG/2ML IJ SOLN
INTRAMUSCULAR | Status: DC | PRN
  Administered 2018-10-19 (×2): 25 ug via INTRAVENOUS
  Administered 2018-10-19: 50 ug via INTRAVENOUS

## 2018-10-19 MED ORDER — TRAMADOL HCL 50 MG PO TABS: 50.0000 mg | ORAL_TABLET | Freq: Four times a day (QID) | ORAL | 0 refills | Status: DC | PRN

## 2018-10-19 MED ORDER — FAMOTIDINE 20 MG PO TABS
ORAL_TABLET | ORAL | Status: AC
  Filled 2018-10-19: qty 1

## 2018-10-19 MED ORDER — VASOPRESSIN 20 UNIT/ML IV SOLN
INTRAVENOUS | Status: DC | PRN
  Administered 2018-10-19: 16 mL via INTRAMUSCULAR

## 2018-10-19 MED ORDER — SUCCINYLCHOLINE CHLORIDE 20 MG/ML IJ SOLN
INTRAMUSCULAR | Status: DC | PRN
  Administered 2018-10-19: 100 mg via INTRAVENOUS

## 2018-10-19 MED ORDER — LIDOCAINE HCL (CARDIAC) PF 100 MG/5ML IV SOSY
PREFILLED_SYRINGE | INTRAVENOUS | Status: DC | PRN
  Administered 2018-10-19: 100 mg via INTRAVENOUS

## 2018-10-19 MED ORDER — MIDAZOLAM HCL 2 MG/2ML IJ SOLN
INTRAMUSCULAR | Status: AC
  Filled 2018-10-19: qty 2

## 2018-10-19 MED ORDER — DEXAMETHASONE SODIUM PHOSPHATE 10 MG/ML IJ SOLN
INTRAMUSCULAR | Status: DC | PRN
  Administered 2018-10-19: 10 mg via INTRAVENOUS

## 2018-10-19 MED ORDER — ROCURONIUM BROMIDE 100 MG/10ML IV SOLN
INTRAVENOUS | Status: DC | PRN
  Administered 2018-10-19: 10 mg via INTRAVENOUS
  Administered 2018-10-19: 20 mg via INTRAVENOUS

## 2018-10-19 MED ORDER — VASOPRESSIN 20 UNIT/ML IV SOLN
INTRAVENOUS | Status: AC
  Filled 2018-10-19: qty 1

## 2018-10-19 MED ORDER — OXYCODONE HCL 5 MG PO TABS: 5.0000 mg | ORAL_TABLET | Freq: Once | ORAL | Status: DC | PRN

## 2018-10-19 MED ORDER — OXYCODONE HCL 5 MG/5ML PO SOLN: 5.0000 mg | Freq: Once | ORAL | Status: DC | PRN

## 2018-10-19 MED ORDER — PROPOFOL 10 MG/ML IV BOLUS
INTRAVENOUS | Status: AC
  Filled 2018-10-19: qty 20

## 2018-10-19 MED ORDER — PROMETHAZINE HCL 25 MG/ML IJ SOLN: 6.2500 mg | INTRAMUSCULAR | Status: DC | PRN

## 2018-10-19 MED ORDER — ACETAMINOPHEN 10 MG/ML IV SOLN
INTRAVENOUS | Status: DC | PRN
  Administered 2018-10-19: 1000 mg via INTRAVENOUS

## 2018-10-19 MED ORDER — KETOROLAC TROMETHAMINE 30 MG/ML IJ SOLN
30.0000 mg | Freq: Once | INTRAMUSCULAR | Status: AC
  Administered 2018-10-19: 17:00:00 30 mg via INTRAVENOUS

## 2018-10-19 MED ORDER — NAPROXEN 500 MG PO TABS: 500.0000 mg | ORAL_TABLET | Freq: Two times a day (BID) | ORAL | 1 refills | Status: DC

## 2018-10-19 MED ORDER — FENTANYL CITRATE (PF) 100 MCG/2ML IJ SOLN
INTRAMUSCULAR | Status: AC
  Filled 2018-10-19: qty 2

## 2018-10-19 MED ORDER — SEVOFLURANE IN SOLN
RESPIRATORY_TRACT | Status: AC
  Filled 2018-10-19: qty 250

## 2018-10-19 MED ORDER — ONDANSETRON HCL 4 MG/2ML IJ SOLN
INTRAMUSCULAR | Status: AC
  Filled 2018-10-19: qty 2

## 2018-10-19 MED ORDER — FENTANYL CITRATE (PF) 100 MCG/2ML IJ SOLN
INTRAMUSCULAR | Status: AC
  Administered 2018-10-19: 16:00:00 25 ug via INTRAVENOUS
  Filled 2018-10-19: qty 2

## 2018-10-19 MED ORDER — MIDAZOLAM HCL 2 MG/2ML IJ SOLN
INTRAMUSCULAR | Status: DC | PRN
  Administered 2018-10-19: 2 mg via INTRAVENOUS

## 2018-10-19 MED ORDER — LACTATED RINGERS IV SOLN
INTRAVENOUS | Status: DC
  Administered 2018-10-19 (×2): via INTRAVENOUS

## 2018-10-19 MED ORDER — FAMOTIDINE 20 MG PO TABS
20.0000 mg | ORAL_TABLET | Freq: Once | ORAL | Status: AC
  Administered 2018-10-19: 11:00:00 20 mg via ORAL

## 2018-10-19 MED ORDER — PHENYLEPHRINE HCL (PRESSORS) 10 MG/ML IV SOLN
INTRAVENOUS | Status: DC | PRN
  Administered 2018-10-19: 100 ug via INTRAVENOUS
  Administered 2018-10-19: 80 ug via INTRAVENOUS

## 2018-10-19 MED ORDER — ACETAMINOPHEN NICU IV SYRINGE 10 MG/ML
INTRAVENOUS | Status: AC
  Filled 2018-10-19: qty 1

## 2018-10-19 MED ORDER — KETOROLAC TROMETHAMINE 30 MG/ML IJ SOLN
INTRAMUSCULAR | Status: AC
  Filled 2018-10-19: qty 1

## 2018-10-19 SURGICAL SUPPLY — 24 items
CATH ROBINSON RED A/P 16FR (CATHETERS) ×4 IMPLANT
DEVICE MYOSURE LITE (MISCELLANEOUS) IMPLANT
DEVICE MYOSURE REACH (MISCELLANEOUS) IMPLANT
ELECT REM PT RETURN 9FT ADLT (ELECTROSURGICAL)
ELECTRODE REM PT RTRN 9FT ADLT (ELECTROSURGICAL) IMPLANT
GLOVE BIOGEL PI IND STRL 6.5 (GLOVE) ×2 IMPLANT
GLOVE BIOGEL PI INDICATOR 6.5 (GLOVE) ×2
GLOVE SURG SYN 6.5 ES PF (GLOVE) ×2 IMPLANT
GOWN STRL REUS W/ TWL LRG LVL3 (GOWN DISPOSABLE) ×2 IMPLANT
GOWN STRL REUS W/TWL LRG LVL3 (GOWN DISPOSABLE) ×2
IV LACTATED RINGER IRRG 3000ML (IV SOLUTION) ×6
IV LR IRRIG 3000ML ARTHROMATIC (IV SOLUTION) ×6 IMPLANT
KIT PROCEDURE FLUENT (KITS) ×2 IMPLANT
MYOSURE XL FIBROID (MISCELLANEOUS) ×6
NDL DEFLUX 3.7X23X350 (MISCELLANEOUS) ×2
NEEDLE DEFLUX 3.7X23X350 (MISCELLANEOUS) ×1 IMPLANT
PACK DNC HYST (MISCELLANEOUS) ×2 IMPLANT
PAD OB MATERNITY 4.3X12.25 (PERSONAL CARE ITEMS) ×2 IMPLANT
PAD PREP 24X41 OB/GYN DISP (PERSONAL CARE ITEMS) ×2 IMPLANT
SEAL ROD LENS SCOPE MYOSURE (ABLATOR) ×2 IMPLANT
SOL .9 NS 3000ML IRR  AL (IV SOLUTION) ×1
SOL .9 NS 3000ML IRR UROMATIC (IV SOLUTION) ×1 IMPLANT
SYSTEM TISS REMOVAL MYOSURE XL (MISCELLANEOUS) ×3 IMPLANT
TOWEL OR 17X26 4PK STRL BLUE (TOWEL DISPOSABLE) ×2 IMPLANT
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
## 2018-10-19 NOTE — Transfer of Care (Signed)
Immediate Anesthesia Transfer of Care Note  Patient: Catherine Christensen  Procedure(s) Performed: HYSTEROSCOPY WITH MYOSURE (N/A )  Patient Location: PACU  Anesthesia Type:General  Level of Consciousness: sedated  Airway & Oxygen Therapy: Patient Spontanous Breathing and Patient connected to face mask oxygen  Post-op Assessment: Report given to RN and Post -op Vital signs reviewed and stable  Post vital signs: Reviewed and stable  Last Vitals:  Vitals Value Taken Time  BP 145/88 10/19/18 1536  Temp 36.2 C 10/19/18 1536  Pulse 68 10/19/18 1539  Resp 10 10/19/18 1539  SpO2 100 % 10/19/18 1539  Vitals shown include unvalidated device data.  Last Pain:  Vitals:   10/19/18 1536  TempSrc:   PainSc: Asleep         Complications: No apparent anesthesia complications

## 2018-10-19 NOTE — Anesthesia Post-op Follow-up Note (Signed)
Anesthesia QCDR form completed.        

## 2018-10-19 NOTE — Anesthesia Postprocedure Evaluation (Signed)
Anesthesia Post Note  Patient: Catherine Christensen  Procedure(s) Performed: HYSTEROSCOPY WITH MYOSURE (N/A )  Patient location during evaluation: PACU Anesthesia Type: General Level of consciousness: awake and alert Pain management: pain level controlled Vital Signs Assessment: post-procedure vital signs reviewed and stable Respiratory status: spontaneous breathing, nonlabored ventilation and respiratory function stable Cardiovascular status: blood pressure returned to baseline and stable Postop Assessment: no apparent nausea or vomiting Anesthetic complications: no     Last Vitals:  Vitals:   10/19/18 1536 10/19/18 1551  BP: (!) 145/88 (!) 141/84  Pulse: 70 66  Resp: 16 18  Temp: (!) 36.2 C   SpO2: 100% 99%    Last Pain:  Vitals:   10/19/18 1551  TempSrc:   PainSc: [place]

## 2018-10-19 NOTE — Anesthesia Procedure Notes (Signed)
Procedure Name: Intubation Date/Time: 10/19/2018 1:14 PM Performed by: Philbert Riser, CRNA Pre-anesthesia Checklist: Patient identified, Emergency Drugs available, Suction available, Patient being monitored and Timeout performed Patient Re-evaluated:Patient Re-evaluated prior to induction Oxygen Delivery Method: Circle system utilized and Simple face mask Preoxygenation: Pre-oxygenation with 100% oxygen Induction Type: IV induction Ventilation: Mask ventilation without difficulty Laryngoscope Size: Mac and 3 Grade View: Grade I Tube type: Oral Tube size: 7.0 mm Number of attempts: 1 Airway Equipment and Method: Stylet Placement Confirmation: ETT inserted through vocal cords under direct vision Secured at: 21 cm Tube secured with: Tape Dental Injury: Teeth and Oropharynx as per pre-operative assessment

## 2018-10-19 NOTE — Discharge Instructions (Addendum)
Hysteroscopy, Care After °This sheet gives you information about how to care for yourself after your procedure. Your health care provider may also give you more specific instructions. If you have problems or questions, contact your health care provider. °What can I expect after the procedure? °After the procedure, it is common to have: °· Cramping. °· Bleeding. This can vary from light spotting to menstrual-like bleeding. °Follow these instructions at home: °Activity °· Rest for 1-2 days after the procedure. °· Do not douche, use tampons, or have sex for 2 weeks after the procedure, or until your health care provider approves. °· Do not drive for 24 hours after the procedure, or for as long as told by your health care provider. °· Do not drive, use heavy machinery, or drink alcohol while taking prescription pain medicines. °Medicines ° °· Take over-the-counter and prescription medicines only as told by your health care provider. °· Do not take aspirin during recovery. It can increase the risk of bleeding. °General instructions °· Do not take baths, swim, or use a hot tub until your health care provider approves. Take showers instead of baths for 2 weeks, or for as long as told by your health care provider. °· To prevent or treat constipation while you are taking prescription pain medicine, your health care provider may recommend that you: °? Drink enough fluid to keep your urine clear or pale yellow. °? Take over-the-counter or prescription medicines. °? Eat foods that are high in fiber, such as fresh fruits and vegetables, whole grains, and beans. °? Limit foods that are high in fat and processed sugars, such as fried and sweet foods. °· Keep all follow-up visits as told by your health care provider. This is important. °Contact a health care provider if: °· You feel dizzy or lightheaded. °· You feel nauseous. °· You have abnormal vaginal discharge. °· You have a rash. °· You have pain that does not get better with  medicine. °· You have chills. °Get help right away if: °· You have bleeding that is heavier than a normal menstrual period. °· You have a fever. °· You have pain or cramps that get worse. °· You develop new abdominal pain. °· You faint. °· You have pain in your shoulders. °· You have shortness of breath. °Summary °· After the procedure, you may have cramping and some vaginal bleeding. °· Do not douche, use tampons, or have sex for 2 weeks after the procedure, or until your health care provider approves. °· Do not take baths, swim, or use a hot tub until your health care provider approves. Take showers instead of baths for 2 weeks, or for as long as told by your health care provider. °· Report any unusual symptoms to your health care provider. °· Keep all follow-up visits as told by your health care provider. This is important. °This information is not intended to replace advice given to you by your health care provider. Make sure you discuss any questions you have with your health care provider. °Document Released: 12/01/2012 Document Revised: 01/23/2017 Document Reviewed: 03/11/2016 °Elsevier Patient Education © 2020 [employer] ° °AMBULATORY SURGERY  °DISCHARGE INSTRUCTIONS ° ° °1) The drugs that you were given will stay in your system until tomorrow so for the next 24 hours you should not: ° °A) Drive an automobile °B) Make any legal decisions °C) Drink any alcoholic beverage ° ° °2) You may resume regular meals tomorrow.  Today it is better to start with liquids and gradually work   up to solid foods. ° °You may eat anything you prefer, but it is better to start with liquids, then soup and crackers, and gradually work up to solid foods. ° ° °3) Please notify your doctor immediately if you have any unusual bleeding, trouble breathing, redness and pain at the surgery site, drainage, fever, or pain not relieved by medication. ° ° ° °4) Additional Instructions: ° ° ° ° ° ° ° °Please contact your physician with any  problems or Same Day Surgery at [phone], Monday through Friday 6 am to 4 pm, or [place] [facility] [place] Main number at [phone]. ° °

## 2018-10-19 NOTE — Progress Notes (Signed)
Pt still having some cramping pain. Dr. Gilman Schmidt notified. Acknowledged. Orders received for Toradol.

## 2018-10-19 NOTE — Interval H&P Note (Signed)
History and Physical Interval Note:  10/19/2018 12:34 PM  Catherine Christensen  has presented today for surgery, with the diagnosis of SUBMUCOSAL FIBROID.  The various methods of treatment have been discussed with the patient and family. After consideration of risks, benefits and other options for treatment, the patient has consented to  Procedure(s): HYSTEROSCOPY WITH MYOSURE (N/A) as a surgical intervention.  The patient's history has been reviewed, patient examined, no change in status, stable for surgery.  I have reviewed the patient's chart and labs.  Questions were answered to the patient's satisfaction.     [place]

## 2018-10-19 NOTE — Op Note (Signed)
Operative Note  10/19/2018  PRE-OP DIAGNOSIS: Submucosal fibroid  POST-OP DIAGNOSIS: same   SURGEON: Adrian Prows MD  PROCEDURE: Procedure(s): HYSTEROSCOPY WITH MYOSURE   ANESTHESIA: Choice   ESTIMATED BLOOD LOSS: 25 cc   SPECIMENS:  Submucosal fibroid and endometrial curettings  FLUID DEFICIT: [id number] cc  COMPLICATIONS: None  DISPOSITION: PACU - hemodynamically stable.  CONDITION: stable  FINDINGS: Exam under anesthesia revealed  Mobile 10 cm uterus with bilateral adnexa without masses or fullness. Hysteroscopy revealed a large submucosal fibroid uterine cavity with bilateral tubal ostia and normal appearing endocervical canal.  PROCEDURE IN DETAIL: After informed consent was obtained, the patient was taken to the operating room where anesthesia was obtained without difficulty. The patient was positioned in the dorsal lithotomy position in [place]. The patient's bladder was catheterized with an in and out foley catheter. The patient was examined under anesthesia, with the above noted findings. The weighted speculum was placed inside the patient's vagina, and the the anterior lip of the cervix was seen and grasped with the tenaculum.  The uterine cavity was sounded to 10cm, and then the cervix was progressively dilated to a 16 French-Pratt dilator. The 0 degree hysteroscope was introduced, with saline fluid used to distend the intrauterine cavity, with the above noted findings.  The fibroid was injected with vasopressin using the hysteroscopic needle. The Myosure was used to remove the uterine fibroid.  The fibroid was large and calcified and removal was challenging. The uterine cavity was curetted to try and remove the fibroid but this also was not successful. Despite using 3 different Myosure devices and extraordinary effort the fibroid was not able to be entirely removed. After over an hour of attempting to remove the fibroid the procedure was discontinued. The hysteroscope  and all instruments were removed from the vagina.  Excellent hemostasis was noted throughout. She was then taken out of dorsal lithotomy.  The patient tolerated the procedure well. Sponge, lap and needle counts were correct x2. The patient was taken to recovery room in excellent condition.  Adrian Prows MD [facility], [facility] 10/19/2018 3:36 PM

## 2018-10-20 ENCOUNTER — Telehealth: Payer: Self-pay | Admitting: Obstetrics and Gynecology

## 2018-10-20 NOTE — Telephone Encounter (Signed)
Patient is calling to speak with Dr. Gilman Schmidt about her surgery. Patient is aware Dr. Gilman Schmidt is not in office.

## 2018-10-20 NOTE — Telephone Encounter (Signed)
CRS given this information and stated she would call pt to discuss with her what happened in surgery.
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
## 2018-10-22 LAB — SURGICAL PATHOLOGY

## 2018-10-25 ENCOUNTER — Telehealth: Payer: Self-pay

## 2018-10-25 NOTE — Telephone Encounter (Signed)
Patient states her return to work date was listed as 10/27/2018.However, she can not return to work until she has been released and her apt isn't until Thursday 10/28/2018. So, her return to work date needs to be adjusted to 10/29/2018. She says she needs this taken care of today.

## 2018-10-25 NOTE — Telephone Encounter (Signed)
Patient requesting a return call regarding her FMLA. [phone]

## 2018-10-25 NOTE — Telephone Encounter (Signed)
Pt aware amended FMLA paperwork has been faxed to her job. Stating her return on 10/29/18

## 2018-10-28 ENCOUNTER — Telehealth: Payer: Self-pay | Admitting: Obstetrics and Gynecology

## 2018-10-28 ENCOUNTER — Telehealth: Payer: Self-pay

## 2018-10-28 ENCOUNTER — Encounter: Payer: Self-pay | Admitting: Obstetrics and Gynecology

## 2018-10-28 ENCOUNTER — Ambulatory Visit (INDEPENDENT_AMBULATORY_CARE_PROVIDER_SITE_OTHER): Payer: BC Managed Care – PPO | Admitting: Obstetrics and Gynecology

## 2018-10-28 ENCOUNTER — Other Ambulatory Visit: Payer: Self-pay

## 2018-10-28 VITALS — BP 110/68 | HR 82 | Ht 67.5 in | Wt 224.0 lb

## 2018-10-28 DIAGNOSIS — D251 Intramural leiomyoma of uterus: Secondary | ICD-10-CM | POA: Diagnosis not present

## 2018-10-28 NOTE — Telephone Encounter (Signed)
Pt called earlier requesting to speak with me about FMLA. I gave pt a call and asked her what she needed me to do for her. She stated she is having a hysterectomy on 11/09/18 and needs her FMLA changed. I have advised pt that she would need to bring me new forms so that I could fill them out according to the upcoming surgery. Pt voiced understanding and stated she will bring them to me on Tuesday morning.

## 2018-10-28 NOTE — Telephone Encounter (Signed)
-----   Message from Homero Fellers, MD sent at 10/28/2018 12:04 PM EDT ----- Surgery Booking Request Patient Full Name:  Catherine Christensen  MRN: [id number]  DOB: Jan 06, 1966  Surgeon: Homero Fellers, MD  Requested Surgery Date and Time: 11/09/2018 Primary Diagnosis AND Code: fibroid uterus Secondary Diagnosis and Code: menorrhagia Surgical Procedure: TLH/BS L&D Notification: Yes Admission Status: same day surgery Length of Surgery: 3 hours Special Case Needs: none H&P: 10/28/2018 (date) Phone Interview???: yes Interpreter: Language:  Medical Clearance: yes Special Scheduling Instructions: none Acuity: P3

## 2018-10-28 NOTE — Telephone Encounter (Signed)
Patient is aware of Pre-admit testing phone interview to be scheduled, Covid testing (via Dr. Gilman Schmidt while in office), and OR on 11/09/18. Patient is aware she will be asked to quarantine after Covid testing. Patient's BCBS [id number] in epic termed on 10/25/18. Per patient gave new insurance card today.

## 2018-10-28 NOTE — Telephone Encounter (Signed)
Patient needs to speak to [facility] regarding her FMLA. She states sadly that it needs another amendment and today is the only day she will available to talk to her as she returns to work tomorrow. [id number]

## 2018-10-28 NOTE — Telephone Encounter (Signed)
Her first surgery that day is a hysterectomy, and they have it listed for 210 min (3 1/2 hours).  This is never the case, will be done much sooner.  Inquire as to why they have listed so long.  Then this case is to follow.  They say at 1100, but should be much sooner.  Either way I will do case w Dr Gilman Schmidt, and still should make office by 130.

## 2018-10-28 NOTE — Progress Notes (Signed)
  Postoperative Follow-up Patient presents post op from operative hysteroscopy for submucosal fibroid, 1 week ago.  Subjective: Patient reports some improvement in her preop symptoms. Eating a regular diet without difficulty. Pain is controlled without any medications.  Activity: normal activities of daily living. Patient reports additional symptom's since surgery of Irregular bleeding.  Objective: BP 110/68   Pulse 82   Ht 5' 7.5" (1.715 m)   Wt 224 lb (101.6 kg)   BMI 34.57 kg/m  Physical Exam Constitutional:      Appearance: She is well-developed.  Genitourinary:     Vagina and uterus normal.     No lesions in the vagina.     No cervical motion tenderness.     No right or left adnexal mass present.  HENT:     Head: Normocephalic and atraumatic.  Neck:     Musculoskeletal: Neck supple.     Thyroid: No thyromegaly.  Cardiovascular:     Rate and Rhythm: Normal rate and regular rhythm.     Heart sounds: Normal heart sounds.  Pulmonary:     Effort: Pulmonary effort is normal.     Breath sounds: Normal breath sounds.  Chest:     Breasts:        Right: No inverted nipple, mass, nipple discharge or skin change.        Left: No inverted nipple, mass, nipple discharge or skin change.  Abdominal:     General: Bowel sounds are normal. There is no distension.     Palpations: Abdomen is soft. There is no mass.  Neurological:     Mental Status: She is alert and oriented to person, place, and time.  Skin:    General: Skin is warm and dry.  Psychiatric:        Behavior: Behavior normal.        Thought Content: Thought content normal.        Judgment: Judgment normal.  Vitals signs reviewed.     Assessment: s/p :  operative hysteroscopy stable  Plan: Patient has done well after surgery with no apparent complications.  I have discussed the post-operative course to date, and the expected progress moving forward.  The patient understands what complications to be concerned about.   I will see the patient in routine follow up, or sooner if needed.    Activity plan: No restriction..  Pelvic rest.  Will schedule hysterectomy for 11/09/2018 Discussed surgical consents today and scheduled procedure Will obtain CT for surgical planning.     R  10/28/2018, 12:14 PM

## 2018-10-28 NOTE — Telephone Encounter (Signed)
Returned pts call and has been documented in chart.

## 2018-10-29 ENCOUNTER — Other Ambulatory Visit: Payer: Self-pay | Admitting: Obstetrics and Gynecology

## 2018-10-29 DIAGNOSIS — D250 Submucous leiomyoma of uterus: Secondary | ICD-10-CM

## 2018-10-29 DIAGNOSIS — D251 Intramural leiomyoma of uterus: Secondary | ICD-10-CM
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
## 2018-11-02 ENCOUNTER — Ambulatory Visit
Admission: RE | Admit: 2018-11-02 | Discharge: 2018-11-02 | Disposition: A | Discharge status: Home / Self Care | Payer: BC Managed Care – PPO | Source: Ambulatory Visit | Attending: Obstetrics and Gynecology | Admitting: Obstetrics and Gynecology

## 2018-11-02 ENCOUNTER — Other Ambulatory Visit: Payer: Self-pay

## 2018-11-02 ENCOUNTER — Ambulatory Visit: Payer: BC Managed Care – PPO

## 2018-11-02 DIAGNOSIS — D250 Submucous leiomyoma of uterus: Secondary | ICD-10-CM | POA: Diagnosis not present

## 2018-11-02 DIAGNOSIS — Z01818 Encounter for other preprocedural examination: Secondary | ICD-10-CM | POA: Diagnosis not present

## 2018-11-02 DIAGNOSIS — D259 Leiomyoma of uterus, unspecified: Secondary | ICD-10-CM | POA: Diagnosis not present

## 2018-11-02 DIAGNOSIS — D251 Intramural leiomyoma of uterus: Secondary | ICD-10-CM | POA: Insufficient documentation

## 2018-11-02 MED ORDER — IOHEXOL 300 MG/ML  SOLN
100.0000 mL | Freq: Once | INTRAMUSCULAR | Status: AC | PRN
  Administered 2018-11-02: 100 mL via INTRAVENOUS

## 2018-11-03 ENCOUNTER — Other Ambulatory Visit: Payer: Self-pay

## 2018-11-03 ENCOUNTER — Encounter
Admission: RE | Admit: 2018-11-03 | Discharge: 2018-11-03 | Disposition: A | Discharge status: Home / Self Care | Payer: BC Managed Care – PPO | Source: Ambulatory Visit | Attending: Obstetrics and Gynecology | Admitting: Obstetrics and Gynecology

## 2018-11-03 HISTORY — DX: Type 2 diabetes mellitus without complications: E11.9

## 2018-11-03 NOTE — Patient Instructions (Signed)
Your procedure is scheduled on: 11/09/2018 Tues Report to Same Day Surgery 2nd floor medical mall [facility] Entrance-take elevator on left to 2nd floor.  Check in with surgery information desk.) To find out your arrival time please call [phone] between 1PM - 3PM on 11/08/2018 Mon  Remember: Instructions that are not followed completely may result in serious medical risk, up to and including death, or upon the discretion of your surgeon and anesthesiologist your surgery may need to be rescheduled.    _x___ 1. Do not eat food after midnight the night before your procedure. You may drink clear liquids up to 2 hours before you are scheduled to arrive at the hospital for your procedure.  Do not drink clear liquids within 2 hours of your scheduled arrival to the hospital.  Clear liquids include  --Water or Apple juice without pulp  --Clear carbohydrate beverage such as ClearFast or Gatorade  --Black Coffee or Clear Tea (No milk, no creamers, do not add anything to                  the coffee or Tea Type 1 and type 2 diabetics should only drink water.   ____Ensure clear carbohydrate drink on the way to the hospital for bariatric patients  ____Ensure clear carbohydrate drink 3 hours before surgery.   No gum chewing or hard candies.     __x__ 2. No Alcohol for 24 hours before or after surgery.   __x__3. No Smoking or e-cigarettes for 24 prior to surgery.  Do not use any chewable tobacco products for at least 6 hour prior to surgery   ____  4. Bring all medications with you on the day of surgery if instructed.    __x__ 5. Notify your doctor if there is any change in your medical condition     (cold, fever, infections).    x___6. On the morning of surgery brush your teeth with toothpaste and water.  You may rinse your mouth with mouth wash if you wish.  Do not swallow any toothpaste or mouthwash.   Do not wear jewelry, make-up, hairpins, clips or nail polish.  Do not wear lotions,  powders, or perfumes. You may wear deodorant.  Do not shave 48 hours prior to surgery. Men may shave face and neck.  Do not bring valuables to the hospital.    [facility] is not responsible for any belongings or valuables.               Contacts, dentures or bridgework may not be worn into surgery.  Leave your suitcase in the car. After surgery it may be brought to your room.  For patients admitted to the hospital, discharge time is determined by your                       treatment team.  _  Patients discharged the day of surgery will not be allowed to drive home.  You will need someone to drive you home and stay with you the night of your procedure.    Please read over the following fact sheets that you were given:   [facility] Preparing for Surgery and or MRSA Information   _x___ Take anti-hypertensive listed below, cardiac, seizure, asthma,     anti-reflux and psychiatric medicines. These include:  1. amLODipine (NORVASC) 10 MG tablet  2.  3.  4.  5.  6.  ____Fleets enema or Magnesium Citrate as directed.   _x___  Use CHG Soap or sage wipes as directed on instruction sheet   ____ Use inhalers on the day of surgery and bring to hospital day of surgery  _x___ Stop Metformin and Janumet 2 days prior to surgery.    ____ Take 1/2 of usual insulin dose the night before surgery and none on the morning     surgery.   _x___ Follow recommendations from Cardiologist, Pulmonologist or PCP regarding          stopping Aspirin, Coumadin, Plavix ,Eliquis, Effient, or Pradaxa, and Pletal.  X____Stop Anti-inflammatories such as Advil, Aleve, Ibuprofen, Motrin, Naproxen, Naprosyn, Goodies powders or aspirin products. OK to take Tylenol and                          Celebrex.   _x___ Stop supplements until after surgery.  But may continue Vitamin D, Vitamin B,       and multivitamin.   ____ Bring C-Pap to the hospital.

## 2018-11-03 NOTE — Pre-Procedure Instructions (Signed)
Called office to request orders.

## 2018-11-05 ENCOUNTER — Other Ambulatory Visit: Payer: Self-pay

## 2018-11-05 ENCOUNTER — Other Ambulatory Visit
Admission: RE | Admit: 2018-11-05 | Discharge: 2018-11-05 | Disposition: A | Discharge status: Home / Self Care | Payer: BC Managed Care – PPO | Source: Ambulatory Visit | Attending: Obstetrics and Gynecology | Admitting: Obstetrics and Gynecology

## 2018-11-05 DIAGNOSIS — Z01812 Encounter for preprocedural laboratory examination: Secondary | ICD-10-CM | POA: Insufficient documentation

## 2018-11-05 DIAGNOSIS — Z0184 Encounter for antibody response examination: Secondary | ICD-10-CM | POA: Insufficient documentation

## 2018-11-05 DIAGNOSIS — Z20828 Contact with and (suspected) exposure to other viral communicable diseases: Secondary | ICD-10-CM | POA: Diagnosis not present

## 2018-11-05 LAB — TYPE AND SCREEN
ABO/RH(D): O POS
Antibody Screen: NEGATIVE

## 2018-11-05 LAB — CBC
HCT: 37.9 % (ref 36.0–46.0)
Hemoglobin: 11.9 g/dL — ABNORMAL LOW (ref 12.0–15.0)
MCH: 26.1 pg (ref 26.0–34.0)
MCHC: 31.4 g/dL (ref 30.0–36.0)
MCV: 83.1 fL (ref 80.0–100.0)
Platelets: 420 10*3/uL — ABNORMAL HIGH (ref 150–400)
RBC: 4.56 MIL/uL (ref 3.87–5.11)
RDW: 14.5 % (ref 11.5–15.5)
WBC: 3.8 10*3/uL — ABNORMAL LOW (ref 4.0–10.5)
nRBC: 0 % (ref 0.0–0.2)

## 2018-11-06 LAB — SARS CORONAVIRUS 2 (TAT 6-24 HRS): SARS Coronavirus 2: NEGATIVE

## 2018-11-09 ENCOUNTER — Other Ambulatory Visit: Payer: Self-pay

## 2018-11-09 ENCOUNTER — Inpatient Hospital Stay: Payer: BC Managed Care – PPO

## 2018-11-09 ENCOUNTER — Encounter
Admission: AD | Disposition: A | Discharge status: Home Health | Payer: Self-pay | Source: Home / Self Care | Attending: Obstetrics and Gynecology

## 2018-11-09 ENCOUNTER — Inpatient Hospital Stay
Admission: AD | Admit: 2018-11-09 | Discharge: 2018-11-27 | DRG: 742 | Disposition: A | Discharge status: Home Health | Payer: BC Managed Care – PPO | Attending: Obstetrics and Gynecology | Admitting: Obstetrics and Gynecology

## 2018-11-09 ENCOUNTER — Ambulatory Visit: Payer: BC Managed Care – PPO | Admitting: Certified Registered"

## 2018-11-09 ENCOUNTER — Encounter: Payer: Self-pay | Admitting: *Deleted

## 2018-11-09 DIAGNOSIS — Y838 Other surgical procedures as the cause of abnormal reaction of the patient, or of later complication, without mention of misadventure at the time of the procedure: Secondary | ICD-10-CM | POA: Diagnosis not present

## 2018-11-09 DIAGNOSIS — K660 Peritoneal adhesions (postprocedural) (postinfection): Secondary | ICD-10-CM | POA: Diagnosis present

## 2018-11-09 DIAGNOSIS — Z888 Allergy status to other drugs, medicaments and biological substances status: Secondary | ICD-10-CM | POA: Diagnosis not present

## 2018-11-09 DIAGNOSIS — Z885 Allergy status to narcotic agent status: Secondary | ICD-10-CM

## 2018-11-09 DIAGNOSIS — B952 Enterococcus as the cause of diseases classified elsewhere: Secondary | ICD-10-CM | POA: Diagnosis not present

## 2018-11-09 DIAGNOSIS — D259 Leiomyoma of uterus, unspecified: Secondary | ICD-10-CM | POA: Diagnosis not present

## 2018-11-09 DIAGNOSIS — D250 Submucous leiomyoma of uterus: Principal | ICD-10-CM | POA: Diagnosis present

## 2018-11-09 DIAGNOSIS — K9172 Accidental puncture and laceration of a digestive system organ or structure during other procedure: Secondary | ICD-10-CM | POA: Diagnosis not present

## 2018-11-09 DIAGNOSIS — Z8249 Family history of ischemic heart disease and other diseases of the circulatory system: Secondary | ICD-10-CM | POA: Diagnosis not present

## 2018-11-09 DIAGNOSIS — N920 Excessive and frequent menstruation with regular cycle: Secondary | ICD-10-CM | POA: Diagnosis not present

## 2018-11-09 DIAGNOSIS — S36503A Unspecified injury of sigmoid colon, initial encounter: Secondary | ICD-10-CM | POA: Diagnosis not present

## 2018-11-09 DIAGNOSIS — Z978 Presence of other specified devices: Secondary | ICD-10-CM

## 2018-11-09 DIAGNOSIS — Z823 Family history of stroke: Secondary | ICD-10-CM

## 2018-11-09 DIAGNOSIS — N800 Endometriosis of uterus: Secondary | ICD-10-CM | POA: Diagnosis not present

## 2018-11-09 DIAGNOSIS — R2689 Other abnormalities of gait and mobility: Secondary | ICD-10-CM | POA: Diagnosis not present

## 2018-11-09 DIAGNOSIS — Z23 Encounter for immunization: Secondary | ICD-10-CM

## 2018-11-09 DIAGNOSIS — K567 Ileus, unspecified: Secondary | ICD-10-CM | POA: Diagnosis not present

## 2018-11-09 DIAGNOSIS — K219 Gastro-esophageal reflux disease without esophagitis: Secondary | ICD-10-CM | POA: Diagnosis present

## 2018-11-09 DIAGNOSIS — Z90710 Acquired absence of both cervix and uterus: Secondary | ICD-10-CM | POA: Diagnosis present

## 2018-11-09 DIAGNOSIS — K598 Other specified functional intestinal disorders: Secondary | ICD-10-CM | POA: Diagnosis not present

## 2018-11-09 DIAGNOSIS — Z7984 Long term (current) use of oral hypoglycemic drugs: Secondary | ICD-10-CM | POA: Diagnosis not present

## 2018-11-09 DIAGNOSIS — Z87891 Personal history of nicotine dependence: Secondary | ICD-10-CM

## 2018-11-09 DIAGNOSIS — T8141XA Infection following a procedure, superficial incisional surgical site, initial encounter: Secondary | ICD-10-CM | POA: Diagnosis not present

## 2018-11-09 DIAGNOSIS — T8189XA Other complications of procedures, not elsewhere classified, initial encounter: Secondary | ICD-10-CM | POA: Diagnosis not present

## 2018-11-09 DIAGNOSIS — F5104 Psychophysiologic insomnia: Secondary | ICD-10-CM | POA: Diagnosis present

## 2018-11-09 DIAGNOSIS — S36409A Unspecified injury of unspecified part of small intestine, initial encounter: Secondary | ICD-10-CM

## 2018-11-09 DIAGNOSIS — D252 Subserosal leiomyoma of uterus: Secondary | ICD-10-CM

## 2018-11-09 DIAGNOSIS — S36409D Unspecified injury of unspecified part of small intestine, subsequent encounter: Secondary | ICD-10-CM | POA: Diagnosis not present

## 2018-11-09 DIAGNOSIS — Z833 Family history of diabetes mellitus: Secondary | ICD-10-CM

## 2018-11-09 DIAGNOSIS — Z0189 Encounter for other specified special examinations: Secondary | ICD-10-CM

## 2018-11-09 DIAGNOSIS — Z79899 Other long term (current) drug therapy: Secondary | ICD-10-CM | POA: Diagnosis not present

## 2018-11-09 DIAGNOSIS — D251 Intramural leiomyoma of uterus: Secondary | ICD-10-CM | POA: Diagnosis not present

## 2018-11-09 DIAGNOSIS — E119 Type 2 diabetes mellitus without complications: Secondary | ICD-10-CM | POA: Diagnosis not present

## 2018-11-09 DIAGNOSIS — Y92234 Operating room of hospital as the place of occurrence of the external cause: Secondary | ICD-10-CM | POA: Diagnosis not present

## 2018-11-09 DIAGNOSIS — S36503D Unspecified injury of sigmoid colon, subsequent encounter: Secondary | ICD-10-CM | POA: Diagnosis not present

## 2018-11-09 DIAGNOSIS — K9189 Other postprocedural complications and disorders of digestive system: Secondary | ICD-10-CM | POA: Diagnosis not present

## 2018-11-09 DIAGNOSIS — Z4682 Encounter for fitting and adjustment of non-vascular catheter: Secondary | ICD-10-CM | POA: Diagnosis not present

## 2018-11-09 DIAGNOSIS — E876 Hypokalemia: Secondary | ICD-10-CM | POA: Diagnosis not present

## 2018-11-09 DIAGNOSIS — I10 Essential (primary) hypertension: Secondary | ICD-10-CM | POA: Diagnosis not present

## 2018-11-09 HISTORY — PX: HYSTERECTOMY ABDOMINAL WITH SALPINGECTOMY: SHX6725

## 2018-11-09 HISTORY — PX: LYSIS OF ADHESION: SHX5961

## 2018-11-09 HISTORY — PX: LAPAROSCOPY: SHX197

## 2018-11-09 HISTORY — PX: PARTIAL COLECTOMY: SHX5273

## 2018-11-09 HISTORY — PX: CYSTOSCOPY: SHX5120

## 2018-11-09 LAB — BASIC METABOLIC PANEL
Anion gap: 10 (ref 5–15)
BUN: 13 mg/dL (ref 6–20)
CO2: 17 mmol/L — ABNORMAL LOW (ref 22–32)
Calcium: 7.4 mg/dL — ABNORMAL LOW (ref 8.9–10.3)
Chloride: 113 mmol/L — ABNORMAL HIGH (ref 98–111)
Creatinine, Ser: 0.82 mg/dL (ref 0.44–1.00)
GFR calc Af Amer: >60 mL/min (ref >60)
GFR calc non Af Amer: >60 mL/min (ref >60)
Glucose, Bld: 147 mg/dL — ABNORMAL HIGH (ref 70–99)
Potassium: 3.9 mmol/L (ref 3.5–5.1)
Sodium: 140 mmol/L (ref 135–145)

## 2018-11-09 LAB — GLUCOSE, CAPILLARY
Glucose-Capillary: 100 mg/dL — ABNORMAL HIGH (ref 70–99)
Glucose-Capillary: 125 mg/dL — ABNORMAL HIGH (ref 70–99)
Glucose-Capillary: 139 mg/dL — ABNORMAL HIGH (ref 70–99)

## 2018-11-09 LAB — CBC
HCT: 36.6 % (ref 36.0–46.0)
Hemoglobin: 11.3 g/dL — ABNORMAL LOW (ref 12.0–15.0)
MCH: 26 pg (ref 26.0–34.0)
MCHC: 30.9 g/dL (ref 30.0–36.0)
MCV: 84.3 fL (ref 80.0–100.0)
Platelets: 411 10*3/uL — ABNORMAL HIGH (ref 150–400)
RBC: 4.34 MIL/uL (ref 3.87–5.11)
RDW: 14.6 % (ref 11.5–15.5)
WBC: 6.8 10*3/uL (ref 4.0–10.5)
nRBC: 0 % (ref 0.0–0.2)

## 2018-11-09 SURGERY — HYSTERECTOMY, TOTAL, ABDOMINAL, WITH SALPINGECTOMY
Anesthesia: General | Laterality: Right

## 2018-11-09 MED ORDER — MIDAZOLAM HCL 2 MG/2ML IJ SOLN
INTRAMUSCULAR | Status: DC | PRN
  Administered 2018-11-09: 2 mg via INTRAVENOUS

## 2018-11-09 MED ORDER — DEXTROSE 5 % IV SOLN
INTRAVENOUS | Status: DC | PRN
  Administered 2018-11-09: 14:00:00 2 g via INTRAVENOUS

## 2018-11-09 MED ORDER — SODIUM CHLORIDE 0.9% FLUSH: 9.0000 mL | INTRAVENOUS | Status: DC | PRN

## 2018-11-09 MED ORDER — KETAMINE HCL 10 MG/ML IJ SOLN
INTRAMUSCULAR | Status: DC | PRN
  Administered 2018-11-09: 15 mg via INTRAVENOUS
  Administered 2018-11-09: 25 mg via INTRAVENOUS

## 2018-11-09 MED ORDER — ROCURONIUM BROMIDE 50 MG/5ML IV SOLN
INTRAVENOUS | Status: AC
  Filled 2018-11-09: qty 1

## 2018-11-09 MED ORDER — LIDOCAINE HCL (PF) 2 % IJ SOLN
INTRAMUSCULAR | Status: AC
  Filled 2018-11-09: qty 10

## 2018-11-09 MED ORDER — KETOROLAC TROMETHAMINE 30 MG/ML IJ SOLN
INTRAMUSCULAR | Status: AC
  Filled 2018-11-09: qty 1

## 2018-11-09 MED ORDER — INSULIN ASPART 100 UNIT/ML ~~LOC~~ SOLN
0.0000 [IU] | SUBCUTANEOUS | Status: DC
  Administered 2018-11-09 – 2018-11-10 (×5): 2 [IU] via SUBCUTANEOUS
  Administered 2018-11-11 (×2): 3 [IU] via SUBCUTANEOUS
  Administered 2018-11-11 (×3): 2 [IU] via SUBCUTANEOUS
  Administered 2018-11-12 (×3): 3 [IU] via SUBCUTANEOUS
  Administered 2018-11-12 – 2018-11-14 (×6): 2 [IU] via SUBCUTANEOUS
  Administered 2018-11-14: 05:00:00 3 [IU] via SUBCUTANEOUS
  Administered 2018-11-14: 2 [IU] via SUBCUTANEOUS
  Administered 2018-11-14: 3 [IU] via SUBCUTANEOUS
  Administered 2018-11-15 (×2): 2 [IU] via SUBCUTANEOUS
  Administered 2018-11-15: 3 [IU] via SUBCUTANEOUS
  Administered 2018-11-15 – 2018-11-16 (×3): 2 [IU] via SUBCUTANEOUS
  Administered 2018-11-16: 06:00:00 3 [IU] via SUBCUTANEOUS
  Administered 2018-11-16 – 2018-11-17 (×7): 2 [IU] via SUBCUTANEOUS
  Administered 2018-11-18 (×4): 3 [IU] via SUBCUTANEOUS
  Administered 2018-11-18: 20:00:00 2 [IU] via SUBCUTANEOUS
  Administered 2018-11-19: 3 [IU] via SUBCUTANEOUS
  Administered 2018-11-19 (×2): 2 [IU] via SUBCUTANEOUS
  Administered 2018-11-20: 3 [IU] via SUBCUTANEOUS
  Administered 2018-11-20: 2 [IU] via SUBCUTANEOUS
  Administered 2018-11-20: 01:00:00 3 [IU] via SUBCUTANEOUS
  Filled 2018-11-09 (×48): qty 1

## 2018-11-09 MED ORDER — HYDROMORPHONE HCL 1 MG/ML IJ SOLN
INTRAMUSCULAR | Status: AC
  Administered 2018-11-09: 0.5 mg via INTRAVENOUS
  Filled 2018-11-09: qty 1

## 2018-11-09 MED ORDER — DEXAMETHASONE SODIUM PHOSPHATE 10 MG/ML IJ SOLN
INTRAMUSCULAR | Status: DC | PRN
  Administered 2018-11-09: 10 mg via INTRAVENOUS

## 2018-11-09 MED ORDER — PROPOFOL 10 MG/ML IV BOLUS
INTRAVENOUS | Status: DC | PRN
  Administered 2018-11-09: 150 mg via INTRAVENOUS
  Administered 2018-11-09: 50 mg via INTRAVENOUS

## 2018-11-09 MED ORDER — PHENOL 1.4 % MT LIQD
1.0000 | OROMUCOSAL | Status: DC | PRN
  Administered 2018-11-13 – 2018-11-15 (×2): 1 via OROMUCOSAL
  Filled 2018-11-09 (×2): qty 177

## 2018-11-09 MED ORDER — FAMOTIDINE 20 MG PO TABS
20.0000 mg | ORAL_TABLET | Freq: Once | ORAL | Status: AC
  Administered 2018-11-09: 10:00:00 20 mg via ORAL

## 2018-11-09 MED ORDER — ONDANSETRON HCL 4 MG/2ML IJ SOLN
INTRAMUSCULAR | Status: DC | PRN
  Administered 2018-11-09: 4 mg via INTRAVENOUS

## 2018-11-09 MED ORDER — SUGAMMADEX SODIUM 200 MG/2ML IV SOLN
INTRAVENOUS | Status: AC
  Filled 2018-11-09: qty 2

## 2018-11-09 MED ORDER — KETAMINE HCL 50 MG/ML IJ SOLN
INTRAMUSCULAR | Status: AC
  Filled 2018-11-09: qty 10

## 2018-11-09 MED ORDER — BUPIVACAINE HCL (PF) 0.5 % IJ SOLN
INTRAMUSCULAR | Status: AC
  Filled 2018-11-09: qty 30

## 2018-11-09 MED ORDER — CEFOXITIN SODIUM 2 G IV SOLR
INTRAVENOUS | Status: AC
  Filled 2018-11-09: qty 2

## 2018-11-09 MED ORDER — LIDOCAINE HCL (CARDIAC) PF 100 MG/5ML IV SOSY
PREFILLED_SYRINGE | INTRAVENOUS | Status: DC | PRN
  Administered 2018-11-09: 100 mg via INTRAVENOUS

## 2018-11-09 MED ORDER — CEFAZOLIN SODIUM-DEXTROSE 2-4 GM/100ML-% IV SOLN
2.0000 g | INTRAVENOUS | Status: AC
  Administered 2018-11-09: 2 g via INTRAVENOUS

## 2018-11-09 MED ORDER — PHENYLEPHRINE HCL (PRESSORS) 10 MG/ML IV SOLN
INTRAVENOUS | Status: DC | PRN
  Administered 2018-11-09: 100 ug via INTRAVENOUS
  Administered 2018-11-09: 150 ug via INTRAVENOUS
  Administered 2018-11-09: 50 ug via INTRAVENOUS
  Administered 2018-11-09: 100 ug via INTRAVENOUS
  Administered 2018-11-09: 200 ug via INTRAVENOUS
  Administered 2018-11-09 (×2): 100 ug via INTRAVENOUS

## 2018-11-09 MED ORDER — ACETAMINOPHEN 10 MG/ML IV SOLN
INTRAVENOUS | Status: DC | PRN
  Administered 2018-11-09: 1000 mg via INTRAVENOUS

## 2018-11-09 MED ORDER — DEXMEDETOMIDINE HCL IN NACL 200 MCG/50ML IV SOLN
INTRAVENOUS | Status: DC | PRN
  Administered 2018-11-09: 8 ug via INTRAVENOUS

## 2018-11-09 MED ORDER — LACTATED RINGERS IV SOLN
INTRAVENOUS | Status: DC
  Administered 2018-11-09 – 2018-11-10 (×3): via INTRAVENOUS

## 2018-11-09 MED ORDER — SODIUM CHLORIDE 0.9 % IV SOLN
INTRAVENOUS | Status: DC
  Administered 2018-11-09 (×2): via INTRAVENOUS

## 2018-11-09 MED ORDER — SODIUM CHLORIDE 0.9 % IV SOLN
2.0000 g | Freq: Four times a day (QID) | INTRAVENOUS | Status: DC
  Administered 2018-11-10 – 2018-11-14 (×20): 2 g via INTRAVENOUS
  Filled 2018-11-09 (×23): qty 2

## 2018-11-09 MED ORDER — ONDANSETRON HCL 4 MG/2ML IJ SOLN: 4.0000 mg | Freq: Four times a day (QID) | INTRAMUSCULAR | Status: DC | PRN

## 2018-11-09 MED ORDER — PANTOPRAZOLE SODIUM 40 MG IV SOLR
40.0000 mg | Freq: Every day | INTRAVENOUS | Status: DC
  Administered 2018-11-09 – 2018-11-26 (×17): 40 mg via INTRAVENOUS
  Filled 2018-11-09 (×20): qty 40

## 2018-11-09 MED ORDER — LACTATED RINGERS IV SOLN: INTRAVENOUS | Status: DC

## 2018-11-09 MED ORDER — FENTANYL CITRATE (PF) 100 MCG/2ML IJ SOLN
25.0000 ug | INTRAMUSCULAR | Status: DC | PRN
  Administered 2018-11-09 (×4): 25 ug via INTRAVENOUS

## 2018-11-09 MED ORDER — ACETAMINOPHEN NICU IV SYRINGE 10 MG/ML
INTRAVENOUS | Status: AC
  Filled 2018-11-09: qty 1

## 2018-11-09 MED ORDER — SODIUM CHLORIDE 0.9 % IV SOLN
2.0000 g | Freq: Four times a day (QID) | INTRAVENOUS | Status: DC
  Administered 2018-11-09: 2 g via INTRAVENOUS
  Filled 2018-11-09 (×4): qty 2

## 2018-11-09 MED ORDER — FENTANYL CITRATE (PF) 100 MCG/2ML IJ SOLN
INTRAMUSCULAR | Status: AC
  Filled 2018-11-09: qty 2

## 2018-11-09 MED ORDER — FAMOTIDINE 20 MG PO TABS
ORAL_TABLET | ORAL | Status: AC
  Administered 2018-11-09: 20 mg via ORAL
  Filled 2018-11-09: qty 1

## 2018-11-09 MED ORDER — SODIUM CHLORIDE 0.9 % IV SOLN
INTRAVENOUS | Status: DC | PRN
  Administered 2018-11-09 (×2): via INTRAVENOUS

## 2018-11-09 MED ORDER — ONDANSETRON HCL 4 MG/2ML IJ SOLN
INTRAMUSCULAR | Status: AC
  Filled 2018-11-09: qty 2

## 2018-11-09 MED ORDER — FENTANYL CITRATE (PF) 100 MCG/2ML IJ SOLN
INTRAMUSCULAR | Status: AC
  Administered 2018-11-09: 25 ug via INTRAVENOUS
  Filled 2018-11-09: qty 2

## 2018-11-09 MED ORDER — KETOROLAC TROMETHAMINE 30 MG/ML IJ SOLN
30.0000 mg | Freq: Four times a day (QID) | INTRAMUSCULAR | Status: DC
  Administered 2018-11-09: 30 mg via INTRAVENOUS
  Filled 2018-11-09: qty 1

## 2018-11-09 MED ORDER — KETOROLAC TROMETHAMINE 30 MG/ML IJ SOLN
30.0000 mg | Freq: Four times a day (QID) | INTRAMUSCULAR | Status: AC
  Administered 2018-11-10 (×3): 30 mg via INTRAVENOUS
  Filled 2018-11-09 (×3): qty 1

## 2018-11-09 MED ORDER — SUCCINYLCHOLINE CHLORIDE 20 MG/ML IJ SOLN
INTRAMUSCULAR | Status: AC
  Filled 2018-11-09: qty 1

## 2018-11-09 MED ORDER — FENTANYL CITRATE (PF) 250 MCG/5ML IJ SOLN
INTRAMUSCULAR | Status: AC
  Filled 2018-11-09: qty 5

## 2018-11-09 MED ORDER — DEXAMETHASONE SODIUM PHOSPHATE 10 MG/ML IJ SOLN
INTRAMUSCULAR | Status: AC
  Filled 2018-11-09: qty 1

## 2018-11-09 MED ORDER — FENTANYL CITRATE (PF) 100 MCG/2ML IJ SOLN
INTRAMUSCULAR | Status: DC | PRN
  Administered 2018-11-09 (×7): 50 ug via INTRAVENOUS

## 2018-11-09 MED ORDER — PROPOFOL 500 MG/50ML IV EMUL
INTRAVENOUS | Status: AC
  Filled 2018-11-09: qty 50

## 2018-11-09 MED ORDER — ROCURONIUM BROMIDE 50 MG/5ML IV SOLN
INTRAVENOUS | Status: AC
  Filled 2018-11-09: qty 2

## 2018-11-09 MED ORDER — FLUORESCEIN SODIUM 10 % IV SOLN
INTRAVENOUS | Status: DC | PRN
  Administered 2018-11-09: 4 mL via INTRAVENOUS

## 2018-11-09 MED ORDER — SODIUM CHLORIDE 0.9 % IV SOLN
INTRAVENOUS | Status: DC | PRN
  Administered 2018-11-09: 25 ug/min via INTRAVENOUS

## 2018-11-09 MED ORDER — HYDROMORPHONE HCL 1 MG/ML IJ SOLN
0.5000 mg | INTRAMUSCULAR | Status: AC | PRN
  Administered 2018-11-09 (×4): 0.5 mg via INTRAVENOUS

## 2018-11-09 MED ORDER — IBUPROFEN 800 MG PO TABS: 800.0000 mg | ORAL_TABLET | Freq: Four times a day (QID) | ORAL | Status: DC

## 2018-11-09 MED ORDER — MIDAZOLAM HCL 2 MG/2ML IJ SOLN
INTRAMUSCULAR | Status: AC
  Filled 2018-11-09: qty 2

## 2018-11-09 MED ORDER — HYDROMORPHONE 1 MG/ML IV SOLN
INTRAVENOUS | Status: DC
  Administered 2018-11-09: 20:00:00 via INTRAVENOUS
  Administered 2018-11-10: 1.22 mL via INTRAVENOUS
  Administered 2018-11-10: 1.39 mL via INTRAVENOUS
  Administered 2018-11-10: 1.02 mL via INTRAVENOUS
  Administered 2018-11-10: 1.04 mL via INTRAVENOUS
  Administered 2018-11-11 (×2): via INTRAVENOUS
  Administered 2018-11-11: 1.02 mL via INTRAVENOUS
  Administered 2018-11-11: 1.54 mg via INTRAVENOUS
  Administered 2018-11-12: 1.67 mg via INTRAVENOUS
  Administered 2018-11-13: 1.07 mg via INTRAVENOUS
  Administered 2018-11-13: 0.7 mg via INTRAVENOUS
  Filled 2018-11-09 (×12): qty 30
  Filled 2018-11-09: qty 50
  Filled 2018-11-09 (×8): qty 30

## 2018-11-09 MED ORDER — ONDANSETRON HCL 4 MG/2ML IJ SOLN: 4.0000 mg | Freq: Once | INTRAMUSCULAR | Status: DC | PRN

## 2018-11-09 MED ORDER — PROPOFOL 500 MG/50ML IV EMUL
INTRAVENOUS | Status: DC | PRN
  Administered 2018-11-09: 20 ug/kg/min via INTRAVENOUS

## 2018-11-09 MED ORDER — NALOXONE HCL 0.4 MG/ML IJ SOLN: 0.4000 mg | INTRAMUSCULAR | Status: DC | PRN

## 2018-11-09 MED ORDER — IBUPROFEN 800 MG PO TABS
800.0000 mg | ORAL_TABLET | Freq: Four times a day (QID) | ORAL | Status: DC
  Administered 2018-11-10: 800 mg via ORAL
  Filled 2018-11-09: qty 1

## 2018-11-09 MED ORDER — FLUORESCEIN SODIUM 10 % IV SOLN
INTRAVENOUS | Status: AC
  Filled 2018-11-09: qty 5

## 2018-11-09 MED ORDER — ROCURONIUM BROMIDE 100 MG/10ML IV SOLN
INTRAVENOUS | Status: DC | PRN
  Administered 2018-11-09 (×2): 20 mg via INTRAVENOUS
  Administered 2018-11-09: 10 mg via INTRAVENOUS
  Administered 2018-11-09: 20 mg via INTRAVENOUS
  Administered 2018-11-09: 30 mg via INTRAVENOUS
  Administered 2018-11-09: 50 mg via INTRAVENOUS

## 2018-11-09 MED ORDER — CEFAZOLIN SODIUM-DEXTROSE 2-4 GM/100ML-% IV SOLN
INTRAVENOUS | Status: AC
  Filled 2018-11-09: qty 100

## 2018-11-09 MED ORDER — ENOXAPARIN SODIUM 40 MG/0.4ML ~~LOC~~ SOLN
40.0000 mg | SUBCUTANEOUS | Status: DC
  Administered 2018-11-10 – 2018-11-27 (×18): 40 mg via SUBCUTANEOUS
  Filled 2018-11-09 (×18): qty 0.4

## 2018-11-09 MED ORDER — SUGAMMADEX SODIUM 200 MG/2ML IV SOLN
INTRAVENOUS | Status: DC | PRN
  Administered 2018-11-09: 200 mg via INTRAVENOUS

## 2018-11-09 MED ORDER — SODIUM CHLORIDE (PF) 0.9 % IJ SOLN
INTRAMUSCULAR | Status: AC
  Administered 2018-11-09: 23:00:00
  Filled 2018-11-09: qty 10

## 2018-11-09 MED ORDER — PROPOFOL 10 MG/ML IV BOLUS
INTRAVENOUS | Status: AC
  Filled 2018-11-09: qty 40

## 2018-11-09 MED ORDER — INFLUENZA VAC SPLIT QUAD 0.5 ML IM SUSY
0.5000 mL | PREFILLED_SYRINGE | INTRAMUSCULAR | Status: AC
  Administered 2018-11-13: 0.5 mL via INTRAMUSCULAR
  Filled 2018-11-09: qty 0.5

## 2018-11-09 MED ORDER — BUPIVACAINE HCL (PF) 0.5 % IJ SOLN
INTRAMUSCULAR | Status: DC | PRN
  Administered 2018-11-09: 14 mL

## 2018-11-09 SURGICAL SUPPLY — 79 items
BAG URINE DRAINAGE (UROLOGICAL SUPPLIES) ×4 IMPLANT
BLADE SURG SZ10 CARB STEEL (BLADE) ×2 IMPLANT
BLADE SURG SZ11 CARB STEEL (BLADE) ×4 IMPLANT
CANISTER SUCT 1200ML W/VALVE (MISCELLANEOUS) ×4 IMPLANT
CATH FOLEY 2WAY  5CC 16FR (CATHETERS) ×1
CATH URTH 16FR FL 2W BLN LF (CATHETERS) ×3 IMPLANT
CHLORAPREP W/TINT 26 (MISCELLANEOUS) ×4 IMPLANT
COUNTER NEEDLE 20/40 LG (NEEDLE) ×2 IMPLANT
DEFOGGER SCOPE WARMER CLEARIFY (MISCELLANEOUS) ×4 IMPLANT
DERMABOND ADVANCED (GAUZE/BANDAGES/DRESSINGS) ×1
DERMABOND ADVANCED .7 DNX12 (GAUZE/BANDAGES/DRESSINGS) ×3 IMPLANT
DEVICE SUTURE ENDOST 10MM (ENDOMECHANICALS) IMPLANT
DRAIN CHANNEL JP 15F RND 16 (MISCELLANEOUS) ×2 IMPLANT
DRAPE CAMERA CLOSED 9X96 (DRAPES) ×3 IMPLANT
DRSG OPSITE POSTOP 4X14 (GAUZE/BANDAGES/DRESSINGS) ×1 IMPLANT
DRSG TEGADERM 2-3/8X2-3/4 SM (GAUZE/BANDAGES/DRESSINGS) ×3 IMPLANT
ELECT CAUTERY BLADE 6.4 (BLADE) ×1 IMPLANT
GLOVE BIOGEL PI IND STRL 6.5 (GLOVE) ×3 IMPLANT
GLOVE BIOGEL PI INDICATOR 6.5 (GLOVE) ×10
GLOVE SURG SYN 6.5 ES PF (GLOVE) ×16 IMPLANT
GLOVE SURG SYN 6.5 PF PI (GLOVE) ×6 IMPLANT
GOWN STRL REUS W/ TWL LRG LVL3 (GOWN DISPOSABLE) ×9 IMPLANT
GOWN STRL REUS W/TWL LRG LVL3 (GOWN DISPOSABLE) ×3
GRASPER SUT TROCAR 14GX15 (MISCELLANEOUS) IMPLANT
HANDLE YANKAUER SUCT BULB TIP (MISCELLANEOUS) ×2 IMPLANT
IRRIGATION STRYKERFLOW (MISCELLANEOUS) IMPLANT
IRRIGATOR STRYKERFLOW (MISCELLANEOUS) ×4
IV LACTATED RINGERS 1000ML (IV SOLUTION) ×7 IMPLANT
KIT PINK PAD W/HEAD ARE REST (MISCELLANEOUS) ×4
KIT PINK PAD W/HEAD ARM REST (MISCELLANEOUS) ×3 IMPLANT
KIT TURNOVER CYSTO (KITS) ×8 IMPLANT
MANIPULATOR VCARE LG CRV RETR (MISCELLANEOUS) IMPLANT
MANIPULATOR VCARE SML CRV RETR (MISCELLANEOUS) IMPLANT
MANIPULATOR VCARE STD CRV RETR (MISCELLANEOUS) ×1 IMPLANT
NS IRRIG 500ML POUR BTL (IV SOLUTION) ×4 IMPLANT
OCCLUDER COLPOPNEUMO (BALLOONS) ×4 IMPLANT
PACK GYN LAPAROSCOPIC (MISCELLANEOUS) ×4 IMPLANT
PAD OB MATERNITY 4.3X12.25 (PERSONAL CARE ITEMS) ×4 IMPLANT
PAD PREP 24X41 OB/GYN DISP (PERSONAL CARE ITEMS) ×4 IMPLANT
PENCIL ELECTRO HAND CTR (MISCELLANEOUS) ×1 IMPLANT
RELOAD PROXIMATE 75MM BLUE (ENDOMECHANICALS) ×12 IMPLANT
RELOAD STAPLE 75 3.8 BLU REG (ENDOMECHANICALS) IMPLANT
SCISSORS METZENBAUM CVD 33 (INSTRUMENTS) IMPLANT
SET CYSTO W/LG BORE CLAMP LF (SET/KITS/TRAYS/PACK) ×4 IMPLANT
SET TRI-LUMEN FLTR TB AIRSEAL (TUBING) ×4 IMPLANT
SHEARS HARMONIC ACE PLUS 36CM (ENDOMECHANICALS) ×1 IMPLANT
SLEEVE ENDOPATH XCEL 5M (ENDOMECHANICALS) ×5 IMPLANT
SPONGE DRAIN TRACH 4X4 STRL 2S (GAUZE/BANDAGES/DRESSINGS) ×1 IMPLANT
SPONGE GAUZE 2X2 8PLY STRL LF (GAUZE/BANDAGES/DRESSINGS) ×3 IMPLANT
SPONGE LAP 18X18 RF (DISPOSABLE) ×1 IMPLANT
STAPLER PROXIMATE 75MM BLUE (STAPLE) ×1 IMPLANT
STAPLER SKIN PROX 35W (STAPLE) ×1 IMPLANT
SUCT RESERVOIR 100CC (MISCELLANEOUS) ×2 IMPLANT
SUT ENDO VLOC 180-0-8IN (SUTURE) IMPLANT
SUT ETHILON 3-0 FS-10 30 BLK (SUTURE) ×4
SUT MNCRL 4-0 (SUTURE) ×2
SUT MNCRL 4-0 27XMFL (SUTURE) ×6
SUT PDS AB 1 TP1 96 (SUTURE) ×2 IMPLANT
SUT SILK 2 0 (SUTURE) ×1
SUT SILK 2 0 SH CR/8 (SUTURE) ×1 IMPLANT
SUT SILK 2-0 18XBRD TIE 12 (SUTURE) IMPLANT
SUT SILK 3 0 (SUTURE) ×1
SUT SILK 3-0 (SUTURE) ×1 IMPLANT
SUT SILK 3-0 18XBRD TIE 12 (SUTURE) IMPLANT
SUT VIC AB 0 CT1 27 (SUTURE) ×1
SUT VIC AB 0 CT1 27XCR 8 STRN (SUTURE) IMPLANT
SUT VIC AB 0 CT1 36 (SUTURE) ×5 IMPLANT
SUT VIC AB 3-0 SH 27 (SUTURE) ×1
SUT VIC AB 3-0 SH 27X BRD (SUTURE) IMPLANT
SUTURE EHLN 3-0 FS-10 30 BLK (SUTURE) IMPLANT
SUTURE MNCRL 4-0 27XMF (SUTURE) ×3 IMPLANT
SYR 10ML LL (SYRINGE) ×4 IMPLANT
SYR 50ML LL SCALE MARK (SYRINGE) ×4 IMPLANT
SYR BULB IRRIG 60ML STRL (SYRINGE) ×1 IMPLANT
TROCAR PORT AIRSEAL 8X100 (TROCAR) ×4 IMPLANT
TROCAR XCEL NON-BLD 5MMX100MML (ENDOMECHANICALS) ×4 IMPLANT
TUBING CONNECTING 10 (TUBING) ×1 IMPLANT
TUBING EVAC SMOKE HEATED PNEUM (TUBING) ×3 IMPLANT
salem sump ×1 IMPLANT

## 2018-11-09 NOTE — Anesthesia Procedure Notes (Signed)
Procedure Name: Intubation Date/Time: 11/09/2018 10:58 AM Performed by: Lavone Orn, CRNA Pre-anesthesia Checklist: Patient identified, Emergency Drugs available, Suction available, Patient being monitored and Timeout performed Patient Re-evaluated:Patient Re-evaluated prior to induction Oxygen Delivery Method: Circle system utilized Preoxygenation: Pre-oxygenation with 100% oxygen Induction Type: IV induction Ventilation: Mask ventilation without difficulty Laryngoscope Size: Mac and 4 Grade View: Grade II Tube type: Oral Tube size: 7.0 mm Number of attempts: 1 Airway Equipment and Method: Stylet Placement Confirmation: ETT inserted through vocal cords under direct vision,  positive ETCO2 and breath sounds checked- equal and bilateral Secured at: 22 cm Tube secured with: Tape Dental Injury: Teeth and Oropharynx as per pre-operative assessment

## 2018-11-09 NOTE — Transfer of Care (Signed)
Immediate Anesthesia Transfer of Care Note  Patient: Catherine Christensen  Procedure(s) Performed: TOTAL LAPAROSCOPIC HYSTERECTOMY WITH LEFT SALPINGECTOMY (Left ) CYSTOSCOPY PARTIAL COLECTOMY WITY PRIMARY ANASTOMOSIS  Patient Location: PACU  Anesthesia Type:General  Level of Consciousness: sedated  Airway & Oxygen Therapy: Patient Spontanous Breathing  Post-op Assessment: Report given to RN and Post -op Vital signs reviewed and stable  Post vital signs: Reviewed and stable  Last Vitals:  Vitals Value Taken Time  BP 106/67 11/09/18 1702  Temp 36.6 C 11/09/18 1702  Pulse 77 11/09/18 1702  Resp 13 11/09/18 1702  SpO2 100 % 11/09/18 1702  Vitals shown include unvalidated device data.  Last Pain:  Vitals:   11/09/18 1702  TempSrc:   PainSc: 0-No pain         Complications: No apparent anesthesia complications

## 2018-11-09 NOTE — Anesthesia Post-op Follow-up Note (Signed)
Anesthesia QCDR form completed.        

## 2018-11-09 NOTE — Op Note (Signed)
Operative Report:  PRE-OP DIAGNOSIS: Fibroid uterus Menorrhagia   POST-OP DIAGNOSIS: Fibroid uterus Menorrhagia   PROCEDURE: Procedure(s): TOTAL ABDOMINAL HYSTERECTOMY WITH RIGHT SALPINGECTOMY PARTIAL COLECTOMY WITY PRIMARY ANASTOMOSIS CYSTOSCOPY  SURGEON: Adrian Prows MD  ASSISTANT: Barnett Applebaum, MD, FACOG   ANESTHESIA: General endotracheal anesthesia  ESTIMATED BLOOD LOSS: 300 mL   SPECIMENS: Uterus, cervix, right fallopian tube  COMPLICATIONS: Bowel injury  DISPOSITION: stable to PACU  FINDINGS: Significant intraabdominal adhesions were noted.  Fibroid uterus. Colon was adhered to left pelvic side wall and uterus. No able to visualize the left ovary or fallopian tube because of significant scar tissue. Right ovary and fallopian tube were grossly normal. Normal liver, appendix, and gallbladder.  PROCEDURE:  The patient was taken to the OR where anesthesia was administed. She was prepped and draped in the normal sterile fashion in the dorsal lithotomy position in the [place] stirrups. A time out was performed. A Graves speculum was inserted, the cervix was grasped with a single tooth tenaculum and the endometrial cavity was sounded. The cervix was progressively dilated to a size 18 [place] with [employer] dilators. A V-Care uterine manipulator was inserted in the usual fashion without incident. Gloves were changed and attention was turned to the abdomen.   A 38mm skin incision was made with the scalpel after local anesthesia applied to the skin. A left upper quadrant port was placed under direct visualization.  A pneumoperitoneum was obtained by insufflation of CO2 (opening pressure of 54mmHg) to 15 mmHg. A diagnostic laparoscopy was performed yielding the previously described findings. 5 mm ports were then placed into the umbilicus, the left lower quadrant and the right lower quadrant.  The Harmonic scalpel was used to lyse the adhesions and omentum from the anterior abdominal wall.   This took 25 minutes.   Attention was turned to the left aspect of the uterus. The patient's colon was adhered to the anterior left aspect of the uterus. The harmonic scalpel where used to dissect the bowel away from the uterus. However an injury to the bowel was observed. An interoperative consultation to general surgery was made. General surgery advised that they would need to convert to an open procedure for the repair. They advised to complete the hysterectomy and they would return to repair the colon.   The laparoscopic instruments were removed. A midline incision was made from the umbilicus to the pubic bone using the scalpel and carried down to the fascia with the bovie. The fascia was incised elecvated with the hemostat and extended using the bovie. The peritoneum was entered. The trocars were removed. A retractor was placed. The bowel was packed with laparotomy sponges.  Round ligaments were then suture ligated and divided on each side with electrocautery and the retroperitoneal space was opened bilaterally. The ureters were identified and preserved. The uteroovarian ligaments were skeletonized, divided, then suture ligated. The bladder flap was created  and the bladder was dissected down off the lower uterine segment and cervix. The uterine arteries were then skeletonized bilaterally clamped, cut, and divided, then suture ligated. The cardinal ligaments were then clamped cut and divided and suture ligated as well. The vaginal angles were then clamped with hysterectomy clamps, cut and suture ligated and tagged. The specimen was then amputated from the vagina using scissors and the vaginal walls were grasped with Kocher clamps. The vaginal cuff was then closed with 0 Vicryl suture in a figure-of-eight fashion with careful attention to include the vaginal cuff angles and the vaginal mucosa within the  closure and avoid the bladder.   General surgery was called. Dr. Celine Ahr completed a partial colectomy  and anastomosis as well as a serosal repair of the small bowel. Please see their dictation for further information.  Once their procedure was completed the abdomen was irrigated copiously. New sterile drapes were placed. Sponge counts were correct. The uterine pedicles were inspected and were hemostatic.   The fascia was repaired with looped PDS suture with a running stitch. The subcutaneous fat was approximated with 0-chromic suture. The skin was closed with staples.  The laparoscopic port incisions were closed with 4-0 monocryl and surgical glue.   Cystoscopy was performed. Fluorescein was given to aide in visualization. Jets of urine were seen from both the left and right ureters. The bladder was inspected in 360 degrees and was normal and without injury.   Sponge, lap, needle and instrument counts were correct x2 at the end of the procedure. Patient was taken to the recovery room in stable condition.  Adrian Prows MD [facility], [facility] 11/09/2018 4:58 PM
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
## 2018-11-09 NOTE — Anesthesia Postprocedure Evaluation (Signed)
Anesthesia Post Note  Patient: Catherine Christensen  Procedure(s) Performed: TOTAL Abdominal  HYSTERECTOMY WITH rightSALPINGECTOMY (Right ) CYSTOSCOPY LAPAROSCOPY OPERATIVE LYSIS OF ADHESION PARTIAL COLECTOMY WITY PRIMARY ANASTOMOSIS  Patient location during evaluation: PACU Anesthesia Type: General Level of consciousness: awake and alert Pain management: pain level controlled Vital Signs Assessment: post-procedure vital signs reviewed and stable Respiratory status: spontaneous breathing, nonlabored ventilation, respiratory function stable and patient connected to nasal cannula oxygen Cardiovascular status: blood pressure returned to baseline and stable Postop Assessment: no apparent nausea or vomiting Anesthetic complications: no     Last Vitals:  Vitals:   11/09/18 2202 11/09/18 2300  BP: 115/76 109/77  Pulse: 81 84  Resp: 12   Temp:  36.5 C  SpO2: 97% 99%    Last Pain:  Vitals:   11/09/18 2300  TempSrc: Oral  PainSc:                  Aseret Hoffman S
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
## 2018-11-09 NOTE — Anesthesia Preprocedure Evaluation (Addendum)
Anesthesia Evaluation  Patient identified by MRN, date of birth, ID band Patient awake    Reviewed: Allergy & Precautions, NPO status , Patient's Chart, lab work & pertinent test results  History of Anesthesia Complications Negative for: history of anesthetic complications  Airway Mallampati: III       Dental   Pulmonary neg sleep apnea, neg COPD, Not current smoker, former smoker,           Cardiovascular hypertension, Pt. on medications (-) Past MI and (-) CHF (-) dysrhythmias (-) Valvular Problems/Murmurs     Neuro/Psych neg Seizures Depression    GI/Hepatic Neg liver ROS, GERD  Medicated,  Endo/Other  diabetes, Type 2, Oral Hypoglycemic Agents  Renal/GU negative Renal ROS     Musculoskeletal   Abdominal   Peds  Hematology   Anesthesia Other Findings   Reproductive/Obstetrics                            Anesthesia Physical Anesthesia Plan  ASA: III  Anesthesia Plan: General   Post-op Pain Management:    Induction: Intravenous  PONV Risk Score and Plan: 3 and Dexamethasone, Ondansetron and Midazolam  Airway Management Planned: Oral ETT  Additional Equipment:   Intra-op Plan:   Post-operative Plan:   Informed Consent: I have reviewed the patients History and Physical, chart, labs and discussed the procedure including the risks, benefits and alternatives for the proposed anesthesia with the patient or authorized representative who has indicated his/her understanding and acceptance.       Plan Discussed with:   Anesthesia Plan Comments:         Anesthesia Quick Evaluation

## 2018-11-10 ENCOUNTER — Other Ambulatory Visit: Payer: BC Managed Care – PPO

## 2018-11-10 LAB — CBC
HCT: 34.7 % — ABNORMAL LOW (ref 36.0–46.0)
Hemoglobin: 10.9 g/dL — ABNORMAL LOW (ref 12.0–15.0)
MCH: 26.3 pg (ref 26.0–34.0)
MCHC: 31.4 g/dL (ref 30.0–36.0)
MCV: 83.6 fL (ref 80.0–100.0)
Platelets: 380 10*3/uL (ref 150–400)
RBC: 4.15 MIL/uL (ref 3.87–5.11)
RDW: 14.7 % (ref 11.5–15.5)
WBC: 10.2 10*3/uL (ref 4.0–10.5)
nRBC: 0 % (ref 0.0–0.2)

## 2018-11-10 LAB — BASIC METABOLIC PANEL
Anion gap: 9 (ref 5–15)
BUN: 16 mg/dL (ref 6–20)
CO2: 20 mmol/L — ABNORMAL LOW (ref 22–32)
Calcium: 7.7 mg/dL — ABNORMAL LOW (ref 8.9–10.3)
Chloride: 112 mmol/L — ABNORMAL HIGH (ref 98–111)
Creatinine, Ser: 0.95 mg/dL (ref 0.44–1.00)
GFR calc Af Amer: >60 mL/min (ref >60)
GFR calc non Af Amer: >60 mL/min (ref >60)
Glucose, Bld: 134 mg/dL — ABNORMAL HIGH (ref 70–99)
Potassium: 4 mmol/L (ref 3.5–5.1)
Sodium: 141 mmol/L (ref 135–145)

## 2018-11-10 LAB — GLUCOSE, CAPILLARY
Glucose-Capillary: 117 mg/dL — ABNORMAL HIGH (ref 70–99)
Glucose-Capillary: 119 mg/dL — ABNORMAL HIGH (ref 70–99)
Glucose-Capillary: 123 mg/dL — ABNORMAL HIGH (ref 70–99)
Glucose-Capillary: 125 mg/dL — ABNORMAL HIGH (ref 70–99)
Glucose-Capillary: 135 mg/dL — ABNORMAL HIGH (ref 70–99)
Glucose-Capillary: 138 mg/dL — ABNORMAL HIGH (ref 70–99)
Glucose-Capillary: 138 mg/dL — ABNORMAL HIGH (ref 70–99)

## 2018-11-10 MED ORDER — AMLODIPINE BESYLATE 5 MG PO TABS
5.0000 mg | ORAL_TABLET | Freq: Every day | ORAL | Status: DC
  Administered 2018-11-11: 5 mg via ORAL
  Filled 2018-11-10 (×3): qty 1

## 2018-11-10 MED ORDER — DEXTROSE IN LACTATED RINGERS 5 % IV SOLN
INTRAVENOUS | Status: AC
  Administered 2018-11-10 – 2018-11-11 (×3): via INTRAVENOUS

## 2018-11-10 MED ORDER — INSULIN DETEMIR 100 UNIT/ML ~~LOC~~ SOLN
5.0000 [IU] | Freq: Every day | SUBCUTANEOUS | Status: DC
  Administered 2018-11-10 – 2018-11-11 (×2): 5 [IU] via SUBCUTANEOUS
  Filled 2018-11-10 (×3): qty 0.05

## 2018-11-10 MED ORDER — LACTATED RINGERS IV BOLUS
500.0000 mL | Freq: Once | INTRAVENOUS | Status: AC
  Administered 2018-11-10: 500 mL via INTRAVENOUS

## 2018-11-10 NOTE — Progress Notes (Signed)
Pt has progressed well this shift. Pain controlled by PCA and IV Toradol, no nausea. Tolerating ice chips and sips of water. Hypoactive bowel sounds, pt has not passed gas or burped yet. Incision and lap sites WDL. Up to bedside & stood and then up to chair with PT. Independently using incentive spirometer.   Foley output this shift: 700 mL JP output this shift: 33mL NG output this shift: 125 mL

## 2018-11-10 NOTE — Progress Notes (Addendum)
[place] Hospital Day(s): 1.   Post op day(s): 1 Day Post-Op.   Interval History: Patient seen and examined, no acute events or new complaints overnight. Patient reports she has abdominal soreness around the incision site which is controlled with PCA. She has not been mobilizing. No fever, chills, nausea, or emesis. She endorses a decreased appetite. NGT with minimal output. JP in RLQ with serosanguinous output. No other acute issues.    Vital signs in last 24 hours: [min-max] current  Temp:  [96.6 F (35.9 C)-98.2 F (36.8 C)] 98.2 F (36.8 C) (09/16 1150) Pulse Rate:  [75-103] 99 (09/16 1150) Resp:  [11-20] 17 (09/16 1150) BP: (106-128)/(62-80) 120/72 (09/16 1150) SpO2:  [94 %-100 %] 99 % (09/16 0802)     Height: 5' 7.5" (171.5 cm) Weight: 103 kg BMI (Calculated): 35.02   Intake/Output last 2 shifts:  09/15 0701 - 09/16 0700 In: 4619.9 [P.O.:25; I.V.:4404.9; NG/GT:90; IV Piggyback:100] Out: [id number] [Urine:1225; Drains:195; Blood:300]   Physical Exam:  Constitutional: alert, cooperative and no distress  HEENT: NGT in place Respiratory: breathing non-labored at rest  Cardiovascular: regular rate and sinus rhythm  Gastrointestinal: soft, incisional soreness, and non-distended. NO rebound or guarding. JP in RLQ with serosanguinous drainage Integumentary: Laparotomy incision is CDI with staples and honeycomb, no erythema or drainage.   Labs:  CBC Latest Ref Rng & Units 11/10/2018 11/09/2018 11/05/2018  WBC 4.0 - 10.5 K/uL 10.2 6.8 3.8(L)  Hemoglobin 12.0 - 15.0 g/dL 10.9(L) 11.3(L) 11.9(L)  Hematocrit 36.0 - 46.0 % 34.7(L) 36.6 37.9  Platelets 150 - 400 K/uL 380 411(H) 420(H)   CMP Latest Ref Rng & Units 11/10/2018 11/09/2018 10/15/2018  Glucose 70 - 99 mg/dL 134(H) 147(H) 100(H)  BUN 6 - 20 mg/dL 16 13 14   Creatinine 0.44 - 1.00 mg/dL 0.95 0.82 0.86  Sodium 135 - 145 mmol/L 141 140 137  Potassium 3.5 - 5.1 mmol/L 4.0 3.9 3.8  Chloride 98  - 111 mmol/L 112(H) 113(H) 110  CO2 22 - 32 mmol/L 20(L) 17(L) 23  Calcium 8.9 - 10.3 mg/dL 7.7(L) 7.4(L) 8.9  Total Protein 6.5 - 8.1 g/dL - - -  Total Bilirubin 0.3 - 1.2 mg/dL - - -  Alkaline Phos 38 - 126 U/L - - -  AST 15 - 41 U/L - - -  ALT 0 - 44 U/L - - -    Imaging studies: No new pertinent imaging studies   Assessment/Plan:  53 y.o. female overall doing well awaiting return of bowel fucntion 1 Day Post-Op s/p Exploratory laparotomy, primary repair of small intestine injury, partial colectomy with primary stapled anastomosis for intra-operative bowel injury   - Continue NPO + IVF + IV ABx   - Continue NGT decompression; monitor output  - pain control prn; antiemetics prn  - monitor abdominal examination; on-going bowel function  - mobilization encouraged as tolerates  - further management per primary service.    All of the above findings and recommendations were discussed with the patient, and the medical team, and all of patient's questions were answered to her expressed satisfaction.  -- Edison Simon, PA-C [facility] 11/10/2018, 12:06 PM [phone] M-F: 7am - 4pm   I saw and evaluated the patient.  I agree with the above documentation, exam, and plan, which I have edited where appropriate. Fredirick Maudlin  12:13 PM
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
## 2018-11-10 NOTE — Evaluation (Signed)
Physical Therapy Evaluation Patient Details Name: Catherine Christensen MRN: [id number] DOB: [birth year] Today's Date: 11/10/2018   History of Present Illness  Pt is a 53 year old female admitted s/p hysterectomy.  PMH includes DM, obesity, depression.  Clinical Impression  Pt is a 53 year old female who lives in a one story home alone.  She is independent without AD at baseline.  She presented with fair to good strength of UE/LE and reported no sensation loss. Pt reported 4/10 pain at surgical site and required mod A for bed mobility with report of increased abdominal pain.  Able to sit at EOB independently.  Pt able to stand from elevated bedside with VC's for use of RW and moving very guarded and slowly.  She was able to ambulate 30 ft in room with RW, maintaining a flexed posture but with good management of RW.  Pt open to all education and with good carryover.  Pt will benefit from continued skilled PT with focus on strength, tolerance to activity, pain management and AD education.      Follow Up Recommendations Home health PT;Supervision - Intermittent    Equipment Recommendations  Rolling walker with 5" wheels;3in1 (PT)    Recommendations for Other Services       Precautions / Restrictions Precautions Precautions: Fall      Mobility  Bed Mobility Overal bed mobility: Needs Assistance Bed Mobility: Supine to Sit     Supine to sit: Mod assist     General bed mobility comments: Hand held assist to bring trunk upright, management of pillow for compression and VC's to bring LE's over EOB.  Transfers Overall transfer level: Needs assistance Equipment used: Rolling walker (2 wheeled) Transfers: Sit to/from Stand Sit to Stand: Min assist;From elevated surface         General transfer comment: VC's for hand placement and management of RW, assistance with pillow for abdominal compression, pt able to stand slowly and with report of pain increase.  Ambulation/Gait Ambulation/Gait  assistance: Min guard Gait Distance (Feet): 30 Feet Assistive device: Rolling walker (2 wheeled)     Gait velocity interpretation: <1.8 ft/sec, indicate of risk for recurrent falls General Gait Details: Fair foot clearance and step length, VC's for use of RW and assistance with management of tubes.  Stairs            Wheelchair Mobility    Modified Rankin (Stroke Patients Only)       Balance Overall balance assessment: Needs assistance Sitting-balance support: Bilateral upper extremity supported;Feet supported Sitting balance-Leahy Scale: Good     Standing balance support: Bilateral upper extremity supported Standing balance-Leahy Scale: Fair Standing balance comment: Requires use of RW for support and stability right now.                             Pertinent Vitals/Pain Pain Assessment: 0-10 Pain Score: 4  Pain Location: incision site Pain Descriptors / Indicators: Aching Pain Intervention(s): Limited activity within patient's tolerance;Monitored during session    [place] expects to be discharged to:: Private residence Living Arrangements: Alone Available Help at Discharge: Family;Available 24 hours/day Type of Home: House Home Access: Stairs to enter Entrance Stairs-Rails: Can reach both Entrance Stairs-Number of Steps: 7 Home Layout: One level Home Equipment: None      Prior Function Level of Independence: Independent         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  Extremity/Trunk Assessment   Upper Extremity Assessment Upper Extremity Assessment: Overall WFL for tasks assessed(Grossly 4/5 bilaterally.)    Lower Extremity Assessment Lower Extremity Assessment: Overall WFL for tasks assessed(Grossly 4-/5 bilaterally,)    Cervical / Trunk Assessment Cervical / Trunk Assessment: Normal  Communication   Communication: No difficulties  Cognition Arousal/Alertness: Awake/alert Behavior During  Therapy: WFL for tasks assessed/performed Overall Cognitive Status: Within Functional Limits for tasks assessed                                        General Comments      Exercises     Assessment/Plan    PT Assessment Patient needs continued PT services  PT Problem List Decreased strength;Decreased mobility;Decreased activity tolerance;Decreased balance;Decreased knowledge of use of DME       PT Treatment Interventions DME instruction;Therapeutic activities;Gait training;Therapeutic exercise;Stair training;Balance training;Functional mobility training;Neuromuscular re-education;Patient/family education    PT Goals (Current goals can be found in the Care Plan section)  Acute Rehab PT Goals Patient Stated Goal: To return to daily activity without pain. PT Goal Formulation: With patient Time For Goal Achievement: 11/24/18 Potential to Achieve Goals: Good    Frequency Min 2X/week   Barriers to discharge        Co-evaluation               AM-PAC PT "6 Clicks" Mobility  Outcome Measure Help needed turning from your back to your side while in a flat bed without using bedrails?: A Lot Help needed moving from lying on your back to sitting on the side of a flat bed without using bedrails?: A Lot Help needed moving to and from a bed to a chair (including a wheelchair)?: A Little Help needed standing up from a chair using your arms (e.g., wheelchair or bedside chair)?: A Little Help needed to walk in hospital room?: A Little Help needed climbing 3-5 steps with a railing? : A Little 6 Click Score: 16    End of Session Equipment Utilized During Treatment: Gait belt Activity Tolerance: Patient limited by pain Patient left: in chair;with call bell/phone within reach Nurse Communication: Mobility status PT Visit Diagnosis: Unsteadiness on feet (R26.81);Muscle weakness (generalized) (M62.81);Difficulty in walking, not elsewhere classified (R26.2)    Time:  [id number] PT Time Calculation (min) (ACUTE ONLY): 34 min   Charges:   PT Evaluation $PT Eval Low Complexity: 1 Low          Roxanne Gates, PT, DPT   Roxanne Gates 11/10/2018, 4:36 PM
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
## 2018-11-11 ENCOUNTER — Inpatient Hospital Stay: Payer: Self-pay

## 2018-11-11 LAB — GLUCOSE, CAPILLARY
Glucose-Capillary: 152 mg/dL — ABNORMAL HIGH (ref 70–99)
Glucose-Capillary: 144 mg/dL — ABNORMAL HIGH (ref 70–99)
Glucose-Capillary: 138 mg/dL — ABNORMAL HIGH (ref 70–99)
Glucose-Capillary: 137 mg/dL — ABNORMAL HIGH (ref 70–99)
Glucose-Capillary: 116 mg/dL — ABNORMAL HIGH (ref 70–99)
Glucose-Capillary: 182 mg/dL — ABNORMAL HIGH (ref 70–99)

## 2018-11-11 LAB — BASIC METABOLIC PANEL
Anion gap: 8 (ref 5–15)
BUN: 13 mg/dL (ref 6–20)
CO2: 23 mmol/L (ref 22–32)
Calcium: 8.1 mg/dL — ABNORMAL LOW (ref 8.9–10.3)
Chloride: 109 mmol/L (ref 98–111)
Creatinine, Ser: 0.8 mg/dL (ref 0.44–1.00)
GFR calc Af Amer: >60 mL/min (ref >60)
GFR calc non Af Amer: >60 mL/min (ref >60)
Glucose, Bld: 159 mg/dL — ABNORMAL HIGH (ref 70–99)
Potassium: 3.7 mmol/L (ref 3.5–5.1)
Sodium: 140 mmol/L (ref 135–145)

## 2018-11-11 LAB — PHOSPHORUS
Phosphorus: 1.6 mg/dL — ABNORMAL LOW (ref 2.5–4.6)
Phosphorus: 2.4 mg/dL — ABNORMAL LOW (ref 2.5–4.6)

## 2018-11-11 LAB — CBC
HCT: 29.2 % — ABNORMAL LOW (ref 36.0–46.0)
Hemoglobin: 9.2 g/dL — ABNORMAL LOW (ref 12.0–15.0)
MCH: 26.1 pg (ref 26.0–34.0)
MCHC: 31.5 g/dL (ref 30.0–36.0)
MCV: 83 fL (ref 80.0–100.0)
Platelets: 304 10*3/uL (ref 150–400)
RBC: 3.52 MIL/uL — ABNORMAL LOW (ref 3.87–5.11)
RDW: 14.8 % (ref 11.5–15.5)
WBC: 12.2 10*3/uL — ABNORMAL HIGH (ref 4.0–10.5)
nRBC: 0 % (ref 0.0–0.2)

## 2018-11-11 LAB — HEMOGLOBIN A1C
Hgb A1c MFr Bld: 6.2 % — ABNORMAL HIGH (ref 4.8–5.6)
Mean Plasma Glucose: 131 mg/dL

## 2018-11-11 LAB — MAGNESIUM: Magnesium: 2 mg/dL (ref 1.7–2.4)

## 2018-11-11 MED ORDER — TRACE MINERALS CR-CU-MN-SE-ZN 10-1000-500-60 MCG/ML IV SOLN
INTRAVENOUS | Status: DC
  Administered 2018-11-11: 20:00:00 via INTRAVENOUS
  Filled 2018-11-11: qty 960

## 2018-11-11 MED ORDER — LACTATED RINGERS IV SOLN
INTRAVENOUS | Status: DC
  Administered 2018-11-11 – 2018-11-12 (×2): via INTRAVENOUS

## 2018-11-11 MED ORDER — CHLORHEXIDINE GLUCONATE CLOTH 2 % EX PADS
6.0000 | MEDICATED_PAD | Freq: Every day | CUTANEOUS | Status: DC
  Administered 2018-11-12 – 2018-11-27 (×15): 6 via TOPICAL

## 2018-11-11 MED ORDER — SODIUM CHLORIDE 0.9% FLUSH
10.0000 mL | INTRAVENOUS | Status: DC | PRN
  Administered 2018-11-19 – 2018-11-20 (×2): 10 mL
  Filled 2018-11-11 (×2): qty 40

## 2018-11-11 MED ORDER — LACTATED RINGERS IV BOLUS
500.0000 mL | Freq: Once | INTRAVENOUS | Status: AC
  Administered 2018-11-11: 500 mL via INTRAVENOUS

## 2018-11-11 MED ORDER — FAT EMULSION PLANT BASED 20 % IV EMUL
250.0000 mL | INTRAVENOUS | Status: AC
  Filled 2018-11-11: qty 250

## 2018-11-11 MED ORDER — IBUPROFEN 400 MG PO TABS
800.0000 mg | ORAL_TABLET | Freq: Four times a day (QID) | ORAL | Status: DC
  Administered 2018-11-11 – 2018-11-20 (×20): 800 mg via ORAL
  Filled 2018-11-11 (×14): qty 2
  Filled 2018-11-11: qty 1
  Filled 2018-11-11 (×5): qty 2
  Filled 2018-11-11: qty 1
  Filled 2018-11-11 (×9): qty 2

## 2018-11-11 MED ORDER — TRACE MINERALS CR-CU-MN-SE-ZN 10-1000-500-60 MCG/ML IV SOLN
INTRAVENOUS | Status: AC
  Filled 2018-11-11: qty 960

## 2018-11-11 MED ORDER — FAT EMULSION PLANT BASED 20 % IV EMUL
250.0000 mL | INTRAVENOUS | Status: DC
  Filled 2018-11-11: qty 250

## 2018-11-11 MED ORDER — TRACE MINERALS CR-CU-MN-SE-ZN 10-1000-500-60 MCG/ML IV SOLN
INTRAVENOUS | Status: DC
  Filled 2018-11-11: qty 960

## 2018-11-11 MED ORDER — FAT EMULSION PLANT BASED 20 % IV EMUL
250.0000 mL | INTRAVENOUS | Status: DC
  Administered 2018-11-11: 250 mL via INTRAVENOUS
  Filled 2018-11-11: qty 250

## 2018-11-11 MED ORDER — POTASSIUM PHOSPHATES 15 MMOLE/5ML IV SOLN
30.0000 mmol | Freq: Once | INTRAVENOUS | Status: AC
  Administered 2018-11-11: 30 mmol via INTRAVENOUS
  Filled 2018-11-11: qty 10

## 2018-11-11 MED ORDER — SODIUM CHLORIDE 0.9% FLUSH
10.0000 mL | Freq: Two times a day (BID) | INTRAVENOUS | Status: DC
  Administered 2018-11-11 – 2018-11-18 (×4): 10 mL
  Administered 2018-11-20: 20 mL
  Administered 2018-11-20 – 2018-11-27 (×8): 10 mL
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
## 2018-11-11 NOTE — Progress Notes (Signed)
Peripherally Inserted Central Catheter/Midline Placement  The IV Nurse has discussed with the patient and/or persons authorized to consent for the patient, the purpose of this procedure and the potential benefits and risks involved with this procedure.  The benefits include less needle sticks, lab draws from the catheter, and the patient may be discharged home with the catheter. Risks include, but not limited to, infection, bleeding, blood clot (thrombus formation), and puncture of an artery; nerve damage and irregular heartbeat and possibility to perform a PICC exchange if needed/ordered by physician.  Alternatives to this procedure were also discussed.  Bard Power PICC patient education guide, fact sheet on infection prevention and patient information card has been provided to patient /or left at bedside.    PICC/Midline Placement Documentation  PICC Double Lumen [id number] PICC Right Basilic 38 cm 0 cm (Active)  Indication for Insertion or Continuance of Line Administration of hyperosmolar/irritating solutions (i.e. TPN, Vancomycin, etc.) 11/11/18 1300  Exposed Catheter (cm) 0 cm 11/11/18 1300  Site Assessment Clean;Dry;Intact 11/11/18 1300  Lumen #1 Status Flushed;Saline locked;Blood return noted 11/11/18 1300  Lumen #2 Status Flushed;Saline locked;Blood return noted 11/11/18 1300  Dressing Type Transparent;Securing device 11/11/18 1300  Dressing Status Clean;Dry;Intact;Antimicrobial disc in place 11/11/18 1300  Coleman checked and tightened 11/11/18 1300  Dressing Intervention New dressing;Other (Comment) 11/11/18 1300  Dressing Change Due 11/18/18 11/11/18 1300       Virgilio Belling 11/11/2018, 1:46 PM

## 2018-11-11 NOTE — Progress Notes (Signed)
See new order from Dr. Gilman Schmidt for urine output intervention.
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
## 2018-11-11 NOTE — Progress Notes (Addendum)
[facility] Day(s): 2.   Post op day(s): 2 Days Post-Op.   Interval History: Patient seen and examined, no acute events or new complaints overnight. Patient reports she has more abdominal discomfort than yesterday. No complaints of nausea, emesis, fever, or chills. She is slightly tachycardic this morning and has an increased leukocytosis to 12K. No reports of flatus. NGT output 275 ccs and JP with 107 serosanguinous. She did mobilize with PT.     Vital signs in last 24 hours: [min-max] current  Temp:  [97.7 F (36.5 C)-99.9 F (37.7 C)] 99 F (37.2 C) (09/17 0324) Pulse Rate:  [88-109] 106 (09/17 0324) Resp:  [17-100] 18 (09/17 0325) BP: (110-134)/(65-86) 134/86 (09/17 0324) SpO2:  [93 %-99 %] 95 % (09/17 0325)     Height: 5' 7.5" (171.5 cm) Weight: 103 kg BMI (Calculated): 35.02   Intake/Output last 2 shifts:  09/16 0701 - 09/17 0700 In: 4279.3 [P.O.:100; I.V.:3229.3; NG/GT:150; IV Piggyback:800] Out: 2432 [Urine:2050; Emesis/NG output:275; Drains:107]   Physical Exam:  Constitutional: alert, cooperative and no distress  Respiratory: breathing non-labored at rest  Cardiovascular:tachycardic and sinus rhythm  Gastrointestinal: soft, diffuse expected tendermess, and mildly distended, no rebound/guarding. JP in RLQ with serosanguinous output Integumentary: Laparotomy and laparoscopic incisions are CDI, no rebound/guarding  Labs:  CBC Latest Ref Rng & Units 11/10/2018 11/09/2018 11/05/2018  WBC 4.0 - 10.5 K/uL 10.2 6.8 3.8(L)  Hemoglobin 12.0 - 15.0 g/dL 10.9(L) 11.3(L) 11.9(L)  Hematocrit 36.0 - 46.0 % 34.7(L) 36.6 37.9  Platelets 150 - 400 K/uL 380 411(H) 420(H)   CMP Latest Ref Rng & Units 11/10/2018 11/09/2018 10/15/2018  Glucose 70 - 99 mg/dL 134(H) 147(H) 100(H)  BUN 6 - 20 mg/dL 16 13 14   Creatinine 0.44 - 1.00 mg/dL 0.95 0.82 0.86  Sodium 135 - 145 mmol/L 141 140 137  Potassium 3.5 - 5.1 mmol/L 4.0 3.9 3.8  Chloride 98 -  111 mmol/L 112(H) 113(H) 110  CO2 22 - 32 mmol/L 20(L) 17(L) 23  Calcium 8.9 - 10.3 mg/dL 7.7(L) 7.4(L) 8.9  Total Protein 6.5 - 8.1 g/dL - - -  Total Bilirubin 0.3 - 1.2 mg/dL - - -  Alkaline Phos 38 - 126 U/L - - -  AST 15 - 41 U/L - - -  ALT 0 - 44 U/L - - -    Imaging studies: No new pertinent imaging studies   Assessment/Plan: 53 y.o. female  2 Days Post-Op s/p exploratory laparotomy, primary repair of small intestine injury, partial colectomy with primary stapled anastomosis for intra-operative bowel injury.  Due to extent of fecal contamination, along with extensive dissection required for operation, a prolonged ileus is likely.   - NPO (Okay with sips of clears for comfort) + IVF  - Continue NGT decompression with LIS; monitor output  - Suspect she will have prolonged ileus and think it is reasonable to consult for PICC + TPN; consults placed; added nutritional labs  - Continue IV ABx    - pain control prn; antiemetics prn             - monitor abdominal examination; on-going bowel function             - mobilization encouraged as tolerates; working with PT             - further management per primary service.   All of the above findings and recommendations were discussed with the patient, and the medical team, and all of  patient's questions were answered to her expressed satisfaction.  -- Edison Simon, PA-C [facility] 11/11/2018, 7:44 AM [phone] M-F: 7am - 4pm   I saw and evaluated the patient.  I agree with the above documentation, exam, and plan, which I have edited where appropriate. Fredirick Maudlin  9:42 AM
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
## 2018-11-11 NOTE — Progress Notes (Signed)
Pt has progressed well again today. Pain controlled by PCA, no nausea. Tolerating ice chips and sips of water. Hypoactive bowel sounds, pt has not passed gas but is burping some. Incision and lap sites WDL, dressing changed this morning- . Upx2 to chair and x1 attempt on [facility] and walked around some in room. Independently using incentive spirometer.   Urine output this shift: 350 mL, foley removed per order at 0930 am, unsuccessful attempt to void at 1400, bladder scan at 1430 214 mL. Dr. Gilman Schmidt paged.  JP output this shift: 70 mL NG output this shift: 150 mL

## 2018-11-12 ENCOUNTER — Ambulatory Visit: Payer: BLUE CROSS/BLUE SHIELD | Admitting: Family Medicine

## 2018-11-12 LAB — GLUCOSE, CAPILLARY
Glucose-Capillary: 168 mg/dL — ABNORMAL HIGH (ref 70–99)
Glucose-Capillary: 169 mg/dL — ABNORMAL HIGH (ref 70–99)
Glucose-Capillary: 152 mg/dL — ABNORMAL HIGH (ref 70–99)
Glucose-Capillary: 140 mg/dL — ABNORMAL HIGH (ref 70–99)

## 2018-11-12 LAB — DIFFERENTIAL
Abs Immature Granulocytes: 0.27 10*3/uL — ABNORMAL HIGH (ref 0.00–0.07)
Basophils Absolute: 0 10*3/uL (ref 0.0–0.1)
Basophils Relative: 0 %
Eosinophils Absolute: 0 10*3/uL (ref 0.0–0.5)
Eosinophils Relative: 0 %
Immature Granulocytes: 2 %
Lymphocytes Relative: 11 %
Lymphs Abs: 1.6 10*3/uL (ref 0.7–4.0)
Monocytes Absolute: 0.6 10*3/uL (ref 0.1–1.0)
Monocytes Relative: 4 %
Neutro Abs: 11.4 10*3/uL — ABNORMAL HIGH (ref 1.7–7.7)
Neutrophils Relative %: 83 %

## 2018-11-12 LAB — PHOSPHORUS: Phosphorus: 2.2 mg/dL — ABNORMAL LOW (ref 2.5–4.6)

## 2018-11-12 LAB — CBC
HCT: 29.9 % — ABNORMAL LOW (ref 36.0–46.0)
Hemoglobin: 9.7 g/dL — ABNORMAL LOW (ref 12.0–15.0)
MCH: 26.6 pg (ref 26.0–34.0)
MCHC: 32.4 g/dL (ref 30.0–36.0)
MCV: 81.9 fL (ref 80.0–100.0)
Platelets: 356 10*3/uL (ref 150–400)
RBC: 3.65 MIL/uL — ABNORMAL LOW (ref 3.87–5.11)
RDW: 14.7 % (ref 11.5–15.5)
WBC: 13.9 10*3/uL — ABNORMAL HIGH (ref 4.0–10.5)
nRBC: 0.1 % (ref 0.0–0.2)

## 2018-11-12 LAB — COMPREHENSIVE METABOLIC PANEL
ALT: 27 U/L (ref 0–44)
AST: 34 U/L (ref 15–41)
Albumin: 2.6 g/dL — ABNORMAL LOW (ref 3.5–5.0)
Alkaline Phosphatase: 32 U/L — ABNORMAL LOW (ref 38–126)
Anion gap: 7 (ref 5–15)
BUN: 11 mg/dL (ref 6–20)
CO2: 24 mmol/L (ref 22–32)
Calcium: 8 mg/dL — ABNORMAL LOW (ref 8.9–10.3)
Chloride: 109 mmol/L (ref 98–111)
Creatinine, Ser: 0.74 mg/dL (ref 0.44–1.00)
GFR calc Af Amer: >60 mL/min (ref >60)
GFR calc non Af Amer: >60 mL/min (ref >60)
Glucose, Bld: 160 mg/dL — ABNORMAL HIGH (ref 70–99)
Potassium: 4.1 mmol/L (ref 3.5–5.1)
Sodium: 140 mmol/L (ref 135–145)
Total Bilirubin: 0.5 mg/dL (ref 0.3–1.2)
Total Protein: 5.8 g/dL — ABNORMAL LOW (ref 6.5–8.1)

## 2018-11-12 LAB — PREALBUMIN: Prealbumin: 13.5 mg/dL — ABNORMAL LOW (ref 18–38)

## 2018-11-12 LAB — MAGNESIUM: Magnesium: 2.2 mg/dL (ref 1.7–2.4)

## 2018-11-12 LAB — TRIGLYCERIDES: Triglycerides: 395 mg/dL — ABNORMAL HIGH (ref <150)

## 2018-11-12 MED ORDER — POTASSIUM PHOSPHATES 15 MMOLE/5ML IV SOLN
30.0000 mmol | Freq: Once | INTRAVENOUS | Status: AC
  Administered 2018-11-12: 30 mmol via INTRAVENOUS
  Filled 2018-11-12: qty 10

## 2018-11-12 MED ORDER — SODIUM CHLORIDE 0.9 % IV SOLN
INTRAVENOUS | Status: DC
  Administered 2018-11-12 – 2018-11-26 (×8): via INTRAVENOUS

## 2018-11-12 MED ORDER — TRACE MINERALS CR-CU-MN-SE-ZN 10-1000-500-60 MCG/ML IV SOLN
INTRAVENOUS | Status: AC
  Administered 2018-11-12: 18:00:00 via INTRAVENOUS
  Filled 2018-11-12: qty 1992

## 2018-11-12 MED ORDER — TRACE MINERALS CR-CU-MN-SE-ZN 10-1000-500-60 MCG/ML IV SOLN
INTRAVENOUS | Status: DC
  Filled 2018-11-12: qty 960

## 2018-11-12 MED ORDER — INSULIN DETEMIR 100 UNIT/ML ~~LOC~~ SOLN
10.0000 [IU] | Freq: Every day | SUBCUTANEOUS | Status: DC
  Administered 2018-11-12 – 2018-11-17 (×6): 10 [IU] via SUBCUTANEOUS
  Filled 2018-11-12 (×8): qty 0.1

## 2018-11-12 MED ORDER — AMLODIPINE BESYLATE 10 MG PO TABS
10.0000 mg | ORAL_TABLET | Freq: Every day | ORAL | Status: DC
  Administered 2018-11-12 – 2018-11-27 (×15): 10 mg via ORAL
  Filled 2018-11-12 (×15): qty 1

## 2018-11-12 NOTE — Progress Notes (Signed)
Pt moved to room 229. Report given to [employer]. Dr. Gilman Schmidt aware of transfer.

## 2018-11-12 NOTE — Progress Notes (Signed)
Nutrition Brief Note   INTERVENTION:   Increase Clinimix E 5/20 to goal rate of 83 mL/hr x 24 hrs  Change 20% ILE to 20 mL/hr x 12 hrs every other day beginning 9/19  Provide adult MVI and trace elements as daily TPN additives.  Plan is for total IV fluid rate of 100 mL/hr including TPN.  Recommend measuring daily weights.  Monitor magnesium, potassium, and phosphorus daily for at least 3 days, MD to replete as needed, as pt is at risk for refeeding syndrome.  Estimated Nutritional Needs:   Kcal:  [id number] (MSJ x 1.3-1.4)  Protein:  105-120 grams  Fluid:  2.1-2.3 L/day  Koleen Distance MS, RD, LDN Pager #- [phone] Office#- [phone] After Hours Pager: [phone]

## 2018-11-12 NOTE — Consult Note (Signed)
[facility] CONSULT NOTE   Pharmacy Consult for TPN  Indication:  Prolonged ileus   Patient Measurements: Height: 5' 7.5" (171.5 cm) Weight: 244 lb (110.7 kg) IBW/kg (Calculated) : 62.75 TPN AdjBW (KG): 72.8 Body mass index is 37.65 kg/m. Usual Weight: 103 kg  Assessment: 53 yo female found to have two large holes in sigmoid colon and deserosalization injury to segment of small intestine during scheduled laparoscopic hysterectomy. Patient is S/P exploratory laparotomy, primary repair of small intestine injury, and partial colectomy. Patient now with post-operative ileus expected to be prolonged.   GI:  Endo: Hx of DM. A1C: 6.1.  Metformin PTA.  Lantus 5u daily inpatient.   Insulin requirements in the past 24 hours: Aspart 11 units Lytes:K: 3.7, Phos: 1.6, Mg: 2.0  Renal:Scr: 0.74, CrCl: 105.3 ml/min Hepatobil: ID:Day 4 Abx: IV cefoxitin 2g Every 6 hours for fecal contamination WBC: 13.9  TPN Access: PICC placement 9/17 TPN start date:  11/11/18 Nutritional Goals (per RD recommendation on): kCal: Protein:  Fluid:  Goal TPN rate is 83 mL/hr  ml/hr  Current Nutrition: D5 in LR @ 162ml/min  Plan:  Increase Clinimix E 5/20 to goal rate of 83 mL/hr x 24 hrs.  Hold lipids today. Restart 20% ILE at 20 mL/hr x 12 hrs tomorrow with every other day dosing.    Electrolytes: Kphos 85mmol infusion x 1 ordered.   Add MVI and trace elements to TPN daily   Q4H SSI and adjust as needed. Continue Levemir 10 units daily.   Will change fluids to NS at 25 mL/hr.   Monitor magnesium, potassium, and phosphorus daily for at least 3 days. TGs with AM labs.   F/U Daily   Olivia Canter, [facility] Clinical Pharmacist 11/12/2018 12:54 PM

## 2018-11-12 NOTE — Progress Notes (Signed)
Physical Therapy Treatment Patient Details Name: Catherine Christensen MRN: [id number] DOB: 1966-01-24 Today's Date: 11/12/2018    History of Present Illness Pt is a 53 year old female admitted s/p hysterectomy.  PMH includes DM, obesity, depression.    PT Comments    Pt sitting up in chair at beginning of session and agreeable to walking.  Pt's R abdominal dressing noted with drainage (and on pt's gown) so nurse changed pt's dressing and pt's gown changed prior to ambulation.  Pain 5/10 R abdominal incision beginning/end of session and 6.5/10 during ambulation.  Pt with flexed posture during ambulation d/t abdominal pain but improved posture noted with increased distance ambulating.  Decreased cadence as well as decreased B LE step length/foot clearance/heelstrike noted with ambulation.  Overall steady with ambulation with RW.  HR 105 bpm at rest and increased to 120 bpm with activity; O2 sats 92% or greater on room air during session's activities.  Will continue to focus on strengthening and progressive functional mobility per pt tolerance.  Pt encouraged to ambulate with nursing staff.    Follow Up Recommendations  Home health PT;Supervision - Intermittent     Equipment Recommendations  Rolling walker with 5" wheels;3in1 (PT)    Recommendations for Other Services       Precautions / Restrictions Precautions Precautions: Fall Precaution Comments: abdominal incision and drain; NG tube Restrictions Weight Bearing Restrictions: No    Mobility  Bed Mobility               General bed mobility comments: Deferred (pt up in chair at beginning and end of session)  Transfers Overall transfer level: Needs assistance Equipment used: Rolling walker (2 wheeled) Transfers: Sit to/from Stand Sit to Stand: Min guard         General transfer comment: vc's for UE placement and scooting to edge of chair prior to standing; increased effort/time to stand d/t abdominal  pain  Ambulation/Gait Ambulation/Gait assistance: Min guard;+2 safety/equipment(chair follow) Gait Distance (Feet): 120 Feet Assistive device: Rolling walker (2 wheeled)   Gait velocity: decreased cadence   General Gait Details: decreased B LE step length/foot clearance/heelstrike; flexed posture d/t abdominal pain (improved upright posture with increased distance ambulating)   Stairs             Wheelchair Mobility    Modified Rankin (Stroke Patients Only)       Balance Overall balance assessment: Needs assistance Sitting-balance support: No upper extremity supported;Feet supported Sitting balance-Leahy Scale: Good Sitting balance - Comments: steady sitting reaching within BOS   Standing balance support: Single extremity supported Standing balance-Leahy Scale: Poor Standing balance comment: pt requires at least single UE support for static standing balance                            Cognition Arousal/Alertness: Awake/alert Behavior During Therapy: WFL for tasks assessed/performed Overall Cognitive Status: Within Functional Limits for tasks assessed                                        Exercises      General Comments General comments (skin integrity, edema, etc.): leakage noted on R abdominal dressing beginning of session (nurse present and changed dressing prior to ambulation and pt's gown changed as well d/t leakage on gown).  Nursing cleared pt for participation in physical therapy and set pt up  with lines for therapy session.  Pt agreeable to PT session.      Pertinent Vitals/Pain Pain Assessment: 0-10 Pain Score: 5  Pain Location: incision site R abdomen Pain Descriptors / Indicators: Aching;Sore Pain Intervention(s): Limited activity within patient's tolerance;Monitored during session;Repositioned;Other (comment)(pt pressed her PCA button 1x prior to ambulation and 1x post ambulation)    Home Living                       Prior Function            PT Goals (current goals can now be found in the care plan section) Acute Rehab PT Goals Patient Stated Goal: To return to daily activity without pain. PT Goal Formulation: With patient Time For Goal Achievement: 11/24/18 Potential to Achieve Goals: Good Progress towards PT goals: Progressing toward goals    Frequency    Min 2X/week      PT Plan Current plan remains appropriate    Co-evaluation              AM-PAC PT "6 Clicks" Mobility   Outcome Measure  Help needed turning from your back to your side while in a flat bed without using bedrails?: A Lot Help needed moving from lying on your back to sitting on the side of a flat bed without using bedrails?: A Lot Help needed moving to and from a bed to a chair (including a wheelchair)?: A Little Help needed standing up from a chair using your arms (e.g., wheelchair or bedside chair)?: A Little Help needed to walk in hospital room?: A Little Help needed climbing 3-5 steps with a railing? : A Little 6 Click Score: 16    End of Session Equipment Utilized During Treatment: Gait belt Activity Tolerance: Patient limited by pain Patient left: in chair;with call bell/phone within reach;with nursing/sitter in room(Nurse present and reported she would finish setting pt up) Nurse Communication: Mobility status;Precautions;Other (comment)(pt's pain status) PT Visit Diagnosis: Unsteadiness on feet (R26.81);Muscle weakness (generalized) (M62.81);Difficulty in walking, not elsewhere classified (R26.2);Pain     Time: 1140-1220 PT Time Calculation (min) (ACUTE ONLY): 40 min  Charges:  $Gait Training: 8-22 mins $Therapeutic Exercise: 8-22 mins $Therapeutic Activity: 8-22 mins                    Leitha Bleak, PT 11/12/18, 1:52 PM [phone]
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
## 2018-11-12 NOTE — Progress Notes (Addendum)
The patient was transfer to the unit today. Dr. Gilman Schmidt has seen the patient. She recommended to walk with the person one more time to help stimulate bowel movements. The patient is aware she needs to ambulate one more time. MD provided a verbal order for Foley catheter to be removed at 0600. Foley catheter is clamped currently and it is to be unclamped and left unclamped at 2100. PCA running and a new bag of TPN started. Dr. Gilman Schmidt was was okay to be contact if needed at [phone].

## 2018-11-12 NOTE — TOC Initial Note (Signed)
Transition of Care [facility]) - Initial/Assessment Note    Patient Details  Name: Catherine Christensen MRN: [id number] Date of Birth: [birth year]  [facility] CM/SW Contact:    Valeska Haislip, Lenice Llamas Phone Number: [phone]  11/12/2018, 2:31 PM  Clinical Narrative: Clinical Social Worker (CSW) reviewed chart and noted that PT is recommending home health, rolling walker and bedside commode. CSW met with patient to discuss D/C plan. Patient was alert and oriented X4 and was sitting up in the chair at bedside. CSW introduced self and explained role of CSW department. Per patient she lives in [place] with her 1 y.o son Gerrod. Patient reported that her sister Joseph Art lives in [place] and will be checking on her often. CSW explained home health process and provided patient with CMS home health list. Per patient she will consider home health and does not have a preference of a home health agency. Patient reported that she would accept a rolling walker and bedside commode. Gennie Alma DME agency representative is aware of above. [facility] declined home health referral. Per Helene Kelp [facility] representative they can accept referral however the earliest patient can been seen by their RN is Wednesday of next week. CSW will continue to follow and assist as needed.           Expected Discharge Plan: [place] Barriers to Discharge: Continued Medical Work up   Patient Goals and CMS Choice Patient states their goals for this hospitalization and ongoing recovery are:: To go home. CMS Medicare.gov Compare Post Acute Care list provided to:: Patient Choice offered to / list presented to : Patient  Expected Discharge Plan and Services Expected Discharge Plan: [place] In-house Referral: Clinical Social Work   Post Acute Care Choice: [place] arrangements for the past 2 months: [place]                 DME Arranged: Bedside commode,  Walker rolling DME Agency: [employer] Date DME Agency Contacted: 11/12/18 Time DME Agency Contacted: [phone] Representative spoke with at DME Agency: Jeneen Rinks HH Arranged: PT, RN [place] Agency: [facility] (formerly [employer]) Date [place]: 11/12/18 Time [place]: 15 Representative spoke with at [place] Arrangements/Services Living arrangements for the past 2 months: [place] Lives with:: Adult Children Patient language and need for interpreter reviewed:: No Do you feel safe going back to the place where you live?: Yes        Care giver support system in place?: Yes (comment)   Criminal Activity/Legal Involvement Pertinent to Current Situation/Hospitalization: No - Comment as needed  Activities of Daily Living Home Assistive Devices/Equipment: Eyeglasses ADL Screening (condition at time of admission) Patient's cognitive ability adequate to safely complete daily activities?: Yes Is the patient deaf or have difficulty hearing?: No Does the patient have difficulty seeing, even when wearing glasses/contacts?: No Does the patient have difficulty concentrating, remembering, or making decisions?: No Patient able to express need for assistance with ADLs?: Yes Does the patient have difficulty dressing or bathing?: No Independently performs ADLs?: Yes (appropriate for developmental age) Does the patient have difficulty walking or climbing stairs?: No Weakness of Legs: None Weakness of Arms/Hands: None  Permission Sought/Granted Permission sought to share information with : Other (comment)(Home Health agency and DME agency.) Permission granted to share information with : Yes, Verbal Permission Granted  Emotional Assessment Appearance:: Appears stated age Attitude/Demeanor/Rapport: Engaged Affect (typically observed): Pleasant, Calm Orientation: : Oriented to Self, Oriented to Place, Oriented to  Time,  Oriented to Situation Alcohol / Substance Use: Not Applicable Psych Involvement: No (comment)  Admission diagnosis:  Fibroid uterus Menorrhagia Patient Active Problem List   Diagnosis Date Noted  . S/P hysterectomy 11/09/2018  . Sigmoid colon injury   . Small intestine injury   . Prediabetes 05/10/2018  . Submucous uterine fibroid 01/12/2018  . Iron deficiency anemia due to chronic blood loss 08/07/2017  . Menorrhagia with regular cycle 03/28/2015  . Chronic constipation 08/23/2014  . Insomnia, persistent 08/20/2014  . Gastro-esophageal reflux disease without esophagitis 08/20/2014  . H/O suicide attempt 08/20/2014  . Cephalalgia 08/20/2014  . Benign hypertension 08/20/2014  . Major depression in complete remission ([place]) 08/20/2014  . Obesity (BMI 30-39.9) 08/20/2014   PCP:  Steele Sizer, MD Pharmacy:   [id number] [place] [address]Phone: [phone]: [phone] [facility] [place]Phone: [phone]: [phone]    Social Determinants of Health (SDOH) Interventions    Readmission Risk Interventions No flowsheet data found.

## 2018-11-12 NOTE — Progress Notes (Signed)
Pt has progressed well again today.  Pain controlled by PCA, no nausea.  Tolerating ice chips and sips of water for comfort  Hypoactive bowel sounds, pt has not passed gas but is burping some.  Incision and lap sites WDL, honeycomb dressing changed this AM. JP insertion site leaking, dressing changed x2 this shift.   Up to chair for most of the day after walking with PT in hallway.   Urine output this shift: 450 ML (bladder training q4h) JP output this shift:  260 mL (change from red to clear yellow in color) NG output this shift: 250 mL (clear output)  Pt will be transferred to room 229 this evening.

## 2018-11-12 NOTE — Progress Notes (Addendum)
[place] Hospital Day(s): 3.   Post op day(s): 3 Days Post-Op.   Interval History: Patient seen and examined, no acute events or new complaints overnight. Patient reports that she continues to have abdominal pain, worse at incisions, improved with PCA. No nausea or emesis. No gas from below. NGT with 400 output. U/o 1.3L yesterday. Started on TPN yesterday.    Vital signs in last 24 hours: [min-max] current  Temp:  [98 F (36.7 C)-98.7 F (37.1 C)] 98 F (36.7 C) (09/18 0744) Pulse Rate:  [94-109] 94 (09/18 0744) Resp:  [16-21] 18 (09/18 0744) BP: (129-169)/(80-96) 164/88 (09/18 0744) SpO2:  [92 %-100 %] 93 % (09/18 0744) Weight:  [105 kg-110.7 kg] 110.7 kg (09/18 0500)     Height: 5' 7.5" (171.5 cm) Weight: 110.7 kg BMI (Calculated): 37.63   Intake/Output last 2 shifts:  09/17 0701 - 09/18 0700 In: 2420.3 [I.V.:1683.4; NG/GT:90; IV Piggyback:646.8] Out: 2025 [Urine:1385; Emesis/NG output:400; Drains:240]   Physical Exam:  Constitutional: alert, cooperative and no distress  HEENT: NGT in place Respiratory: breathing non-labored at rest  Cardiovascular: regular rate and sinus rhythm  Gastrointestinal: soft, incisional soreness, mild distension, no rebound/guarding. JP in RLQ with serous output. Integumentary: Laparotomy incision is CDI with staples and honeycomb dressing, no erythema or drainage   Labs:  CBC Latest Ref Rng & Units 11/11/2018 11/10/2018 11/09/2018  WBC 4.0 - 10.5 K/uL 12.2(H) 10.2 6.8  Hemoglobin 12.0 - 15.0 g/dL 9.2(L) 10.9(L) 11.3(L)  Hematocrit 36.0 - 46.0 % 29.2(L) 34.7(L) 36.6  Platelets 150 - 400 K/uL 304 380 411(H)   CMP Latest Ref Rng & Units 11/11/2018 11/10/2018 11/09/2018  Glucose 70 - 99 mg/dL 159(H) 134(H) 147(H)  BUN 6 - 20 mg/dL 13 16 13   Creatinine 0.44 - 1.00 mg/dL 0.80 0.95 0.82  Sodium 135 - 145 mmol/L 140 141 140  Potassium 3.5 - 5.1 mmol/L 3.7 4.0 3.9  Chloride 98 - 111 mmol/L 109 112(H) 113(H)   CO2 22 - 32 mmol/L 23 20(L) 17(L)  Calcium 8.9 - 10.3 mg/dL 8.1(L) 7.7(L) 7.4(L)  Total Protein 6.5 - 8.1 g/dL - - -  Total Bilirubin 0.3 - 1.2 mg/dL - - -  Alkaline Phos 38 - 126 U/L - - -  AST 15 - 41 U/L - - -  ALT 0 - 44 U/L - - -    Imaging studies: No new pertinent imaging studies   Assessment/Plan:  53 y.o. female with expected post-surgical ileus 3 Days Post-Op s/p exploratory laparotomy, primary repair of small intestine injury, partial colectomy with primary stapled anastomosisfor intra-operative bowel injury.  Due to extent of fecal contamination, along with extensive dissection required for operation, a prolonged ileus is likely.   - NPO (Okay with sips of clears for comfort) + IVF             - Continue NGT decompression with LIS; monitor output  - Continue TPN; advance per dietitain; monitor nutritional labs  - Continue IV Abx   - pain control prn; antiemetics prn - monitor abdominal examination; on-going bowel function - mobilization encouraged as tolerates; working with PT - further management per primary service.    - Discussed with patient's RN, agree with transferring pt to med-surg floor  All of the above findings and recommendations were discussed with the patient, and the medical team, and all of patient's questions were answered to her expressed satisfaction.  -- Edison Simon, PA-C [facility] 11/12/2018, 7:57 AM [phone]  M-F: 7am - 4pm  I saw and evaluated the patient.  I agree with the above documentation, exam, and plan, which I have edited where appropriate. Fredirick Maudlin  12:09 PM

## 2018-11-13 LAB — COMPREHENSIVE METABOLIC PANEL
ALT: 23 U/L (ref 0–44)
AST: 26 U/L (ref 15–41)
Albumin: 2.4 g/dL — ABNORMAL LOW (ref 3.5–5.0)
Alkaline Phosphatase: 25 U/L — ABNORMAL LOW (ref 38–126)
Anion gap: 6 (ref 5–15)
BUN: 11 mg/dL (ref 6–20)
CO2: 25 mmol/L (ref 22–32)
Calcium: 7.9 mg/dL — ABNORMAL LOW (ref 8.9–10.3)
Chloride: 110 mmol/L (ref 98–111)
Creatinine, Ser: 0.58 mg/dL (ref 0.44–1.00)
GFR calc Af Amer: >60 mL/min (ref >60)
GFR calc non Af Amer: >60 mL/min (ref >60)
Glucose, Bld: 141 mg/dL — ABNORMAL HIGH (ref 70–99)
Potassium: 3.4 mmol/L — ABNORMAL LOW (ref 3.5–5.1)
Sodium: 141 mmol/L (ref 135–145)
Total Bilirubin: 0.4 mg/dL (ref 0.3–1.2)
Total Protein: 5.6 g/dL — ABNORMAL LOW (ref 6.5–8.1)

## 2018-11-13 LAB — GLUCOSE, CAPILLARY
Glucose-Capillary: 106 mg/dL — ABNORMAL HIGH (ref 70–99)
Glucose-Capillary: 124 mg/dL — ABNORMAL HIGH (ref 70–99)
Glucose-Capillary: 132 mg/dL — ABNORMAL HIGH (ref 70–99)
Glucose-Capillary: 136 mg/dL — ABNORMAL HIGH (ref 70–99)
Glucose-Capillary: 136 mg/dL — ABNORMAL HIGH (ref 70–99)
Glucose-Capillary: 139 mg/dL — ABNORMAL HIGH (ref 70–99)
Glucose-Capillary: 140 mg/dL — ABNORMAL HIGH (ref 70–99)

## 2018-11-13 LAB — PHOSPHORUS: Phosphorus: 3.1 mg/dL (ref 2.5–4.6)

## 2018-11-13 LAB — MAGNESIUM: Magnesium: 2 mg/dL (ref 1.7–2.4)

## 2018-11-13 MED ORDER — FAT EMULSION PLANT BASED 20 % IV EMUL
250.0000 mL | INTRAVENOUS | Status: AC
  Administered 2018-11-13: 250 mL via INTRAVENOUS
  Filled 2018-11-13: qty 250

## 2018-11-13 MED ORDER — POTASSIUM CHLORIDE 20 MEQ PO PACK
20.0000 meq | PACK | ORAL | Status: AC
  Administered 2018-11-13: 20 meq via ORAL
  Filled 2018-11-13 (×2): qty 1

## 2018-11-13 MED ORDER — HYDROMORPHONE 1 MG/ML IV SOLN
INTRAVENOUS | Status: DC
  Administered 2018-11-13: 25 mg via INTRAVENOUS
  Administered 2018-11-13: 0.7 mg via INTRAVENOUS
  Administered 2018-11-13: 0.742 mg via INTRAVENOUS
  Administered 2018-11-14: 0.639 mg via INTRAVENOUS
  Administered 2018-11-14: 1.36 mg via INTRAVENOUS
  Filled 2018-11-13 (×8): qty 30

## 2018-11-13 MED ORDER — M.V.I. ADULT IV INJ
INTRAVENOUS | Status: AC
  Administered 2018-11-13: 19:00:00 via INTRAVENOUS
  Filled 2018-11-13: qty 1992

## 2018-11-13 NOTE — Progress Notes (Signed)
Patient doing well. She was able to ambulate twice today. She has been up at least 4 times to the restroom. Pain is less controlled today with the decreased basal rate. Will order abdominal binder for patient to wear while walking.   Adrian Prows MD [facility] 11/13/2018 8:42 PM

## 2018-11-13 NOTE — Consult Note (Signed)
[facility] CONSULT NOTE   Pharmacy Consult for TPN  Indication:  Prolonged ileus   Patient Measurements: Height: 5' 7.5" (171.5 cm) Weight: 244 lb (110.7 kg) IBW/kg (Calculated) : 62.75 TPN AdjBW (KG): 72.8 Body mass index is 37.65 kg/m. Usual Weight: 103 kg  Assessment: 53 yo female found to have two large holes in sigmoid colon and deserosalization injury to segment of small intestine during scheduled laparoscopic hysterectomy. Patient is S/P exploratory laparotomy, primary repair of small intestine injury, and partial colectomy. Patient now with post-operative ileus expected to be prolonged.   GI:  Endo: Hx of DM. A1C: 6.1.  Metformin PTA.  Lantus 5u daily inpatient.   Insulin requirements in the past 24 hours: Aspart 11 units Lytes:K: 3.7, Phos: 1.6, Mg: 2.0  Renal:Scr: 0.74, CrCl: 105.3 ml/min Hepatobil: ID:Day 5 Abx: IV cefoxitin 2g Every 6 hours for fecal contamination WBC: 13.9  TPN Access: PICC placement 9/17 TPN start date:  11/11/18 Nutritional Goals (per RD recommendation on): kCal: Protein:  Fluid:  Goal TPN rate is 83 mL/hr  ml/hr  Plan:  Continue Clinimix E 5/20 to goal rate of 83 mL/hr x 24 hrs.  Restart 20% ILE at 20 mL/hr x 12 hrs today with every other day dosing.    Electrolytes: K = 3.4, ordered KCL 20 meq every 4 hours x 2 doses.   Add MVI  to TPN daily, trace elements on MWF.  Q4H SSI and adjust as needed. Continue Levemir 10 units daily.   NS at 25 mL/hr.   Monitor magnesium, potassium, and phosphorus daily for at least 3 days. TGs with AM labs.   F/U Daily   Olivia Canter, [facility] Clinical Pharmacist 11/13/2018 10:38 AM

## 2018-11-13 NOTE — Progress Notes (Signed)
Foley catheter discontinued without difficulty.
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
## 2018-11-13 NOTE — Progress Notes (Signed)
[place] Hospital Day(s): 4.   Post op day(s): 4 Days Post-Op.   Interval History: Patient seen and examined, no acute events or new complaints overnight. Patient reports feeling okay but without significant improvement.  She is still uncomfortable with pain.  She reports that she is still not passing gas.  She denies any nausea or vomiting.  Pain around the abdomen but no specific area.  There is no pain radiation.  Alleviating factor is current pain medications.  There is no aggravating factor.  Vital signs in last 24 hours: [min-max] current  Temp:  [97.8 F (36.6 C)-98.7 F (37.1 C)] 98 F (36.7 C) (09/19 0438) Pulse Rate:  [85-105] 85 (09/19 0438) Resp:  [12-23] 16 (09/19 0438) BP: (126-170)/(70-92) 126/70 (09/19 0438) SpO2:  [95 %-97 %] 95 % (09/19 0438) FiO2 (%):  [96 %] 96 % (09/18 1044)     Height: 5' 7.5" (171.5 cm) Weight: 110.7 kg BMI (Calculated): 37.63   Physical Exam:  Constitutional: alert, cooperative and no distress  Respiratory: breathing non-labored at rest  Cardiovascular: regular rate and sinus rhythm  Gastrointestinal: soft, mild-tender, and distended.  Wound is dry and clean  Labs:  CBC Latest Ref Rng & Units 11/12/2018 11/11/2018 11/10/2018  WBC 4.0 - 10.5 K/uL 13.9(H) 12.2(H) 10.2  Hemoglobin 12.0 - 15.0 g/dL 9.7(L) 9.2(L) 10.9(L)  Hematocrit 36.0 - 46.0 % 29.9(L) 29.2(L) 34.7(L)  Platelets 150 - 400 K/uL 356 304 380   CMP Latest Ref Rng & Units 11/13/2018 11/12/2018 11/11/2018  Glucose 70 - 99 mg/dL 141(H) 160(H) 159(H)  BUN 6 - 20 mg/dL 11 11 13   Creatinine 0.44 - 1.00 mg/dL 0.58 0.74 0.80  Sodium 135 - 145 mmol/L 141 140 140  Potassium 3.5 - 5.1 mmol/L 3.4(L) 4.1 3.7  Chloride 98 - 111 mmol/L 110 109 109  CO2 22 - 32 mmol/L 25 24 23   Calcium 8.9 - 10.3 mg/dL 7.9(L) 8.0(L) 8.1(L)  Total Protein 6.5 - 8.1 g/dL 5.6(L) 5.8(L) -  Total Bilirubin 0.3 - 1.2 mg/dL 0.4 0.5 -  Alkaline Phos 38 - 126 U/L 25(L) 32(L) -  AST 15 - 41 U/L 26 34  -  ALT 0 - 44 U/L 23 27 -    Imaging studies: No new pertinent imaging studies   Assessment/Plan:  53 y.o. female with small and large bowel injury 4 Days Post-Op s/p small bowel repair and partial colectomy with anastomosis.  Patient with persistent postoperative ileus.  Today without passing gas.  Pain currently under control with current pain medications.  Patient has been ambulated.  Patient is very eager to get out of bed.  From surgery standpoint we recommend to continue nasogastric tube decompression, keep electrolytes within normal limits continue with pain control with trying to decrease the use of opiates.  Agreed to continue with TPN, DVT prophylaxis and antibiotic therapy.  We will continue to follow and manage postoperative ileus.    Arnold Long, MD

## 2018-11-14 ENCOUNTER — Other Ambulatory Visit: Payer: Self-pay | Admitting: Family Medicine

## 2018-11-14 LAB — CBC
HCT: 26.8 % — ABNORMAL LOW (ref 36.0–46.0)
Hemoglobin: 8.4 g/dL — ABNORMAL LOW (ref 12.0–15.0)
MCH: 26 pg (ref 26.0–34.0)
MCHC: 31.3 g/dL (ref 30.0–36.0)
MCV: 83 fL (ref 80.0–100.0)
Platelets: 319 10*3/uL (ref 150–400)
RBC: 3.23 MIL/uL — ABNORMAL LOW (ref 3.87–5.11)
RDW: 14.7 % (ref 11.5–15.5)
WBC: 7.1 10*3/uL (ref 4.0–10.5)
nRBC: 0.6 % — ABNORMAL HIGH (ref 0.0–0.2)

## 2018-11-14 LAB — COMPREHENSIVE METABOLIC PANEL
ALT: 25 U/L (ref 0–44)
AST: 27 U/L (ref 15–41)
Albumin: 2.6 g/dL — ABNORMAL LOW (ref 3.5–5.0)
Alkaline Phosphatase: 29 U/L — ABNORMAL LOW (ref 38–126)
Anion gap: 6 (ref 5–15)
BUN: 12 mg/dL (ref 6–20)
CO2: 24 mmol/L (ref 22–32)
Calcium: 8.1 mg/dL — ABNORMAL LOW (ref 8.9–10.3)
Chloride: 108 mmol/L (ref 98–111)
Creatinine, Ser: 0.76 mg/dL (ref 0.44–1.00)
GFR calc Af Amer: >60 mL/min (ref >60)
GFR calc non Af Amer: >60 mL/min (ref >60)
Glucose, Bld: 133 mg/dL — ABNORMAL HIGH (ref 70–99)
Potassium: 3.2 mmol/L — ABNORMAL LOW (ref 3.5–5.1)
Sodium: 138 mmol/L (ref 135–145)
Total Bilirubin: 0.4 mg/dL (ref 0.3–1.2)
Total Protein: 5.9 g/dL — ABNORMAL LOW (ref 6.5–8.1)

## 2018-11-14 LAB — PHOSPHORUS: Phosphorus: 3.9 mg/dL (ref 2.5–4.6)

## 2018-11-14 LAB — GLUCOSE, CAPILLARY
Glucose-Capillary: 118 mg/dL — ABNORMAL HIGH (ref 70–99)
Glucose-Capillary: 119 mg/dL — ABNORMAL HIGH (ref 70–99)
Glucose-Capillary: 131 mg/dL — ABNORMAL HIGH (ref 70–99)
Glucose-Capillary: 145 mg/dL — ABNORMAL HIGH (ref 70–99)
Glucose-Capillary: 152 mg/dL — ABNORMAL HIGH (ref 70–99)
Glucose-Capillary: 157 mg/dL — ABNORMAL HIGH (ref 70–99)

## 2018-11-14 LAB — POTASSIUM: Potassium: 3.8 mmol/L (ref 3.5–5.1)

## 2018-11-14 LAB — MAGNESIUM: Magnesium: 1.9 mg/dL (ref 1.7–2.4)

## 2018-11-14 MED ORDER — HYDROMORPHONE 1 MG/ML IV SOLN
INTRAVENOUS | Status: DC
  Filled 2018-11-14 (×3): qty 30

## 2018-11-14 MED ORDER — HYDROMORPHONE 1 MG/ML IV SOLN
INTRAVENOUS | Status: DC
  Administered 2018-11-14: 1.93 mg via INTRAVENOUS
  Administered 2018-11-14: 25 mg via INTRAVENOUS
  Administered 2018-11-15: 0.798 mg via INTRAVENOUS
  Administered 2018-11-15: 0.779 mg via INTRAVENOUS
  Administered 2018-11-16: 25 mg via INTRAVENOUS
  Administered 2018-11-16: 0.8 mg via INTRAVENOUS
  Administered 2018-11-16: 0.7 mg via INTRAVENOUS
  Administered 2018-11-16: 0.649 mg via INTRAVENOUS
  Administered 2018-11-17: 0.86 mg via INTRAVENOUS
  Administered 2018-11-17: 0.8 mg via INTRAVENOUS
  Filled 2018-11-14: qty 25
  Filled 2018-11-14 (×2): qty 30

## 2018-11-14 MED ORDER — POTASSIUM CHLORIDE 10 MEQ/100ML IV SOLN
10.0000 meq | INTRAVENOUS | Status: AC
  Administered 2018-11-14 (×4): 10 meq via INTRAVENOUS
  Filled 2018-11-14 (×4): qty 100

## 2018-11-14 MED ORDER — POTASSIUM CHLORIDE CRYS ER 20 MEQ PO TBCR: 40.0000 meq | EXTENDED_RELEASE_TABLET | ORAL | Status: DC

## 2018-11-14 MED ORDER — M.V.I. ADULT IV INJ
INTRAVENOUS | Status: AC
  Administered 2018-11-14: 20:00:00 via INTRAVENOUS
  Filled 2018-11-14: qty 1992

## 2018-11-14 NOTE — Consult Note (Signed)
[facility] CONSULT NOTE   Pharmacy Consult for TPN  Indication:  Prolonged ileus   Patient Measurements: Height: 5' 7.5" (171.5 cm) Weight: 244 lb (110.7 kg) IBW/kg (Calculated) : 62.75 TPN AdjBW (KG): 72.8 Body mass index is 37.65 kg/m. Usual Weight: 103 kg  Assessment: 53 yo female found to have two large holes in sigmoid colon and deserosalization injury to segment of small intestine during scheduled laparoscopic hysterectomy. Patient is S/P exploratory laparotomy, primary repair of small intestine injury, and partial colectomy. Patient now with post-operative ileus expected to be prolonged.   GI:  Endo: Hx of DM. A1C: 6.1.  Metformin PTA.  Levemir 10u daily inpatient.   Insulin requirements in the past 24 hours: Aspart 14 units Lytes:K: 3.2, Phos: 3.9, Mg: 21.9  Renal:Scr: 0.76, CrCl: 105.3 ml/min Hepatobil: ID:Day 6 Abx: IV cefoxitin 2g Every 6 hours for fecal contamination WBC: 7.1  TPN Access: PICC placement 9/17 TPN start date:  11/11/18 Nutritional Goals (per RD recommendation on): kCal: Protein:  Fluid:  Goal TPN rate is 83 mL/hr  ml/hr  Plan:  Continue Clinimix E 5/20 to goal rate of 83 mL/hr x 24 hrs.  Restart 20% ILE at 20 mL/hr x 12 hrs tomorrow with every other day dosing.    Electrolytes: K = 3.2, ordered KCL 40 meq every 4 hours x 2 doses.   Add MVI  to TPN daily, trace elements on MWF.  Q4H SSI and adjust as needed. Continue Levemir 10 units daily.   NS at 25 mL/hr.   Monitor magnesium, potassium, and phosphorus daily for at least 3 days. TGs with AM labs.   F/U Daily   Olivia Canter, [facility] Clinical Pharmacist 11/14/2018 9:35 AM

## 2018-11-14 NOTE — Plan of Care (Signed)
  Problem: Activity: Goal: Risk for activity intolerance will decrease Outcome: Progressing  Pt ambulated in hall for 160 ft.

## 2018-11-14 NOTE — Progress Notes (Signed)
[facility] Hospital Day(s): 5.   Post op day(s): 5 Days Post-Op.   Interval History: Patient seen and examined, no acute events or new complaints overnight. Patient reports feeling the same as yesterday.  She reports that she has not passed gas yet.  She did have some abdominal pain in the midline.  There is no pain radiation.  There is no alleviating factor at this moment.  There is no aggravating factor either.  There has been no fever or chills.  Vital signs in last 24 hours: [min-max] current  Temp:  [97.7 F (36.5 C)-98.5 F (36.9 C)] 98.5 F (36.9 C) (09/20 0443) Pulse Rate:  [83-88] 87 (09/20 0443) Resp:  [15-20] 20 (09/20 0443) BP: (102-133)/(71-111) 131/81 (09/20 0443) SpO2:  [95 %-99 %] 97 % (09/20 0443)     Height: 5' 7.5" (171.5 cm) Weight: 110.7 kg BMI (Calculated): 37.63   NGT: 100 cc in last 24 hours. Drain: 150 cc in last 24 hours (serous)  Physical Exam:  Constitutional: alert, cooperative and no distress  Respiratory: breathing non-labored at rest  Cardiovascular: regular rate and sinus rhythm  Gastrointestinal: soft, non-tender, and distended.  Wound is dry and clean  Labs:  CBC Latest Ref Rng & Units 11/14/2018 11/12/2018 11/11/2018  WBC 4.0 - 10.5 K/uL 7.1 13.9(H) 12.2(H)  Hemoglobin 12.0 - 15.0 g/dL 8.4(L) 9.7(L) 9.2(L)  Hematocrit 36.0 - 46.0 % 26.8(L) 29.9(L) 29.2(L)  Platelets 150 - 400 K/uL 319 356 304   CMP Latest Ref Rng & Units 11/14/2018 11/13/2018 11/12/2018  Glucose 70 - 99 mg/dL 133(H) 141(H) 160(H)  BUN 6 - 20 mg/dL 12 11 11   Creatinine 0.44 - 1.00 mg/dL 0.76 0.58 0.74  Sodium 135 - 145 mmol/L 138 141 140  Potassium 3.5 - 5.1 mmol/L 3.2(L) 3.4(L) 4.1  Chloride 98 - 111 mmol/L 108 110 109  CO2 22 - 32 mmol/L 24 25 24   Calcium 8.9 - 10.3 mg/dL 8.1(L) 7.9(L) 8.0(L)  Total Protein 6.5 - 8.1 g/dL 5.9(L) 5.6(L) 5.8(L)  Total Bilirubin 0.3 - 1.2 mg/dL 0.4 0.4 0.5  Alkaline Phos 38 - 126 U/L 29(L) 25(L) 32(L)  AST 15 - 41 U/L 27 26  34  ALT 0 - 44 U/L 25 23 27     Imaging studies: No new pertinent imaging studies   Assessment/Plan:  53 y.o. female with small and large bowel injury 5 Days Post-Op s/p small bowel repair and partial colectomy with anastomosis. Patient with persistent postoperative ileus.  Still not passing gas.  Still needed pain medication to control pain.  Patient is ambulating.  Recommend to continue nasogastric tube decompression, IV hydration and TPN, DVT prophylaxis, keeping electrolyte within normal limits.  I encouraged the patient to ambulate.  We will continue to follow along.  Arnold Long, MD

## 2018-11-14 NOTE — Progress Notes (Signed)
Patient upset with her nursing care during the day. She reports that staff were not responsive when she needed to use the bathroom and she almost urinated in the bed. She also was only able to ambulate once. Night nursing offered to walk with patient.   Changed wound. Increase in purulent drainage from the lower aspect of the incision. Edema noted in the pannus. No erythema. No induration.  Wound cleaned and new dressing applied.   Will change antibiotic to vancomycin and zosyn dosing per pharmacy consult. Wound culture is pending.   Adrian Prows MD [facility], [facility] 11/14/2018 9:21 PM
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
## 2018-11-14 NOTE — Progress Notes (Signed)
Ch visited pt per OR for prayer. Pt declined prayer at this time.    11/14/18 1100  Clinical Encounter Type  Visited With Patient  Visit Type Spiritual support  Referral From Physician  Consult/Referral To Chaplain  Spiritual Encounters  Spiritual Needs Prayer  Stress Factors  Patient Stress Factors Health changes

## 2018-11-14 NOTE — Consult Note (Signed)
[place] CONSULT NOTE   Pharmacy Consult for TPN  Indication:  Prolonged ileus   Patient Measurements: Height: 5' 7.5" (171.5 cm) Weight: 244 lb (110.7 kg) IBW/kg (Calculated) : 62.75 TPN AdjBW (KG): 72.8 Body mass index is 37.65 kg/m. Usual Weight: 103 kg  Assessment: 53 yo female found to have two large holes in sigmoid colon and deserosalization injury to segment of small intestine during scheduled laparoscopic hysterectomy. Patient is S/P exploratory laparotomy, primary repair of small intestine injury, and partial colectomy. Patient now with post-operative ileus expected to be prolonged.   GI:  Endo: Hx of DM. A1C: 6.1.  Metformin PTA.  Levemir 10u daily inpatient.   Insulin requirements in the past 24 hours: Aspart 14 units Lytes:K: 3.2, Phos: 3.9, Mg: 21.9  Renal:Scr: 0.76, CrCl: 105.3 ml/min Hepatobil: ID:Day 6 Abx: IV cefoxitin 2g Every 6 hours for fecal contamination WBC: 7.1  TPN Access: PICC placement 9/17 TPN start date:  11/11/18 Nutritional Goals (per RD recommendation on): kCal: Protein:  Fluid:  Goal TPN rate is 83 mL/hr  ml/hr  Plan:  Continue Clinimix E 5/20 to goal rate of 83 mL/hr x 24 hrs.  Restart 20% ILE at 20 mL/hr x 12 hrs tomorrow with every other day dosing.    Electrolytes: K = 3.2, ordered KCL 40 meq every 4 hours x 2 doses.   Add MVI  to TPN daily, trace elements on MWF.  Q4H SSI and adjust as needed. Continue Levemir 10 units daily.   NS at 25 mL/hr.   Monitor magnesium, potassium, and phosphorus daily for at least 3 days. TGs with AM labs.   F/U Daily    Update @ 6:23 pm: K 3.8 WNL. No additional replacement needed. Will follow up with AM labs.   Rowland Lathe, [facility] Clinical Pharmacist 11/14/2018 6:22 PM

## 2018-11-15 LAB — DIFFERENTIAL
Abs Immature Granulocytes: 0.32 10*3/uL — ABNORMAL HIGH (ref 0.00–0.07)
Basophils Absolute: 0 10*3/uL (ref 0.0–0.1)
Basophils Relative: 1 %
Eosinophils Absolute: 0.2 10*3/uL (ref 0.0–0.5)
Eosinophils Relative: 3 %
Immature Granulocytes: 5 %
Lymphocytes Relative: 20 %
Lymphs Abs: 1.3 10*3/uL (ref 0.7–4.0)
Monocytes Absolute: 0.8 10*3/uL (ref 0.1–1.0)
Monocytes Relative: 12 %
Neutro Abs: 3.9 10*3/uL (ref 1.7–7.7)
Neutrophils Relative %: 59 %
Smear Review: NORMAL

## 2018-11-15 LAB — COMPREHENSIVE METABOLIC PANEL
ALT: 27 U/L (ref 0–44)
AST: 28 U/L (ref 15–41)
Albumin: 2.5 g/dL — ABNORMAL LOW (ref 3.5–5.0)
Alkaline Phosphatase: 31 U/L — ABNORMAL LOW (ref 38–126)
Anion gap: 8 (ref 5–15)
BUN: 11 mg/dL (ref 6–20)
CO2: 24 mmol/L (ref 22–32)
Calcium: 8.1 mg/dL — ABNORMAL LOW (ref 8.9–10.3)
Chloride: 106 mmol/L (ref 98–111)
Creatinine, Ser: 0.59 mg/dL (ref 0.44–1.00)
GFR calc Af Amer: >60 mL/min (ref >60)
GFR calc non Af Amer: >60 mL/min (ref >60)
Glucose, Bld: 133 mg/dL — ABNORMAL HIGH (ref 70–99)
Potassium: 3.5 mmol/L (ref 3.5–5.1)
Sodium: 138 mmol/L (ref 135–145)
Total Bilirubin: 0.8 mg/dL (ref 0.3–1.2)
Total Protein: 6.1 g/dL — ABNORMAL LOW (ref 6.5–8.1)

## 2018-11-15 LAB — CBC
HCT: 26.8 % — ABNORMAL LOW (ref 36.0–46.0)
Hemoglobin: 8.6 g/dL — ABNORMAL LOW (ref 12.0–15.0)
MCH: 26.2 pg (ref 26.0–34.0)
MCHC: 32.1 g/dL (ref 30.0–36.0)
MCV: 81.7 fL (ref 80.0–100.0)
Platelets: 339 10*3/uL (ref 150–400)
RBC: 3.28 MIL/uL — ABNORMAL LOW (ref 3.87–5.11)
RDW: 14.5 % (ref 11.5–15.5)
WBC: 6.4 10*3/uL (ref 4.0–10.5)
nRBC: 0.3 % — ABNORMAL HIGH (ref 0.0–0.2)

## 2018-11-15 LAB — GLUCOSE, CAPILLARY
Glucose-Capillary: 120 mg/dL — ABNORMAL HIGH (ref 70–99)
Glucose-Capillary: 123 mg/dL — ABNORMAL HIGH (ref 70–99)
Glucose-Capillary: 134 mg/dL — ABNORMAL HIGH (ref 70–99)
Glucose-Capillary: 134 mg/dL — ABNORMAL HIGH (ref 70–99)
Glucose-Capillary: 135 mg/dL — ABNORMAL HIGH (ref 70–99)
Glucose-Capillary: 138 mg/dL — ABNORMAL HIGH (ref 70–99)
Glucose-Capillary: 156 mg/dL — ABNORMAL HIGH (ref 70–99)

## 2018-11-15 LAB — PHOSPHORUS: Phosphorus: 3.5 mg/dL (ref 2.5–4.6)

## 2018-11-15 LAB — PREALBUMIN: Prealbumin: 17.8 mg/dL — ABNORMAL LOW (ref 18–38)

## 2018-11-15 LAB — MAGNESIUM: Magnesium: 1.9 mg/dL (ref 1.7–2.4)

## 2018-11-15 LAB — TRIGLYCERIDES: Triglycerides: 128 mg/dL (ref <150)

## 2018-11-15 MED ORDER — PIPERACILLIN-TAZOBACTAM 3.375 G IVPB
3.3750 g | Freq: Three times a day (TID) | INTRAVENOUS | Status: DC
  Administered 2018-11-15 – 2018-11-17 (×5): 3.375 g via INTRAVENOUS
  Filled 2018-11-15 (×6): qty 50

## 2018-11-15 MED ORDER — TRACE MINERALS CR-CU-MN-SE-ZN 10-1000-500-60 MCG/ML IV SOLN
INTRAVENOUS | Status: AC
  Administered 2018-11-15: 21:00:00 via INTRAVENOUS
  Filled 2018-11-15: qty 1992

## 2018-11-15 MED ORDER — POTASSIUM CHLORIDE 10 MEQ/100ML IV SOLN
10.0000 meq | Freq: Once | INTRAVENOUS | Status: AC
  Administered 2018-11-15: 10 meq via INTRAVENOUS
  Filled 2018-11-15: qty 100

## 2018-11-15 MED ORDER — VANCOMYCIN HCL 10 G IV SOLR
2000.0000 mg | Freq: Once | INTRAVENOUS | Status: AC
  Administered 2018-11-15: 2000 mg via INTRAVENOUS
  Filled 2018-11-15: qty 2000

## 2018-11-15 MED ORDER — FAT EMULSION PLANT BASED 20 % IV EMUL
250.0000 mL | INTRAVENOUS | Status: AC
  Administered 2018-11-15: 250 mL via INTRAVENOUS
  Filled 2018-11-15: qty 250

## 2018-11-15 MED ORDER — VANCOMYCIN HCL IN DEXTROSE 1-5 GM/200ML-% IV SOLN
1000.0000 mg | Freq: Two times a day (BID) | INTRAVENOUS | Status: DC
  Administered 2018-11-16 (×3): 1000 mg via INTRAVENOUS
  Filled 2018-11-15 (×5): qty 200

## 2018-11-15 MED ORDER — POTASSIUM CHLORIDE 10 MEQ/100ML IV SOLN
10.0000 meq | INTRAVENOUS | Status: AC
  Administered 2018-11-15: 10 meq via INTRAVENOUS
  Filled 2018-11-15: qty 100

## 2018-11-15 NOTE — Progress Notes (Signed)
Dressing and packing removed. 5 staples removed from the umbilicus because of purulent drainage. Wound cleaned with half hydrogen peroxide and half sterile water. Wound packed with iodoform tape. Dressing replaced.   Adrian Prows MD [facility], [facility] 11/15/2018 11:41 PM

## 2018-11-15 NOTE — Consult Note (Signed)
Pharmacy Antibiotic Note  Catherine Christensen is a 53 y.o. female admitted on 11/09/2018 with cellulitis  53 yo female found to have two large holes in sigmoid colon and deserosalization injury to segment of small intestine during scheduled laparoscopic hysterectomy. Patient is S/P exploratory laparotomy, primary repair of small intestine injury, and partial colectomy. Patient now with post-operative ileus expected to be prolonged.  Pharmacy has been consulted for Vancomycin/Zosyn dosing.  Plan: Zosyn 3.375g IV q8h (4 hour infusion).  Will give loading dose of Vancomycin 2000mg  IV, followed by  Vancomycin 1000 mg IV Q 12 hrs. Goal AUC 400-550. Expected AUC: 522.6 SCr used: 0.8   Height: 5' 7.5" (171.5 cm) Weight: 236 lb 12.4 oz (107.4 kg) IBW/kg (Calculated) : 62.75  Temp (24hrs), Avg:98.5 F (36.9 C), Min:98.2 F (36.8 C), Max:98.6 F (37 C)  Recent Labs  Lab 11/10/18 0524 11/11/18 0805 11/12/18 0753 11/13/18 0415 11/14/18 0543 11/15/18 0536  WBC 10.2 12.2* 13.9*  --  7.1 6.4  CREATININE 0.95 0.80 0.74 0.58 0.76 0.59    Estimated Creatinine Clearance: 103.5 mL/min (by C-G formula based on SCr of 0.59 mg/dL).    Allergies  Allergen Reactions  . Feraheme [Ferumoxytol] Shortness Of Breath and Other (See Comments)    Patient stated that she experienced back pain, heat all over her body, full body spasms, and increased blood pressure. Shortly thereafter she had an extreme headache.    Antimicrobials this admission: Cefoxitin 9/15 >>9/20 Zosyn 9/21 >> Vancomycin 9/21 >>  Dose adjustments this admission: None  Microbiology results: Wound Culture 9/20 pending  Thank you for allowing pharmacy to be a part of this patient's care.  Lu Duffel, PharmD, BCPS Clinical Pharmacist 11/15/2018 9:46 AM

## 2018-11-15 NOTE — Progress Notes (Signed)
Patient ambulated about 75 feet in hallway. Tolerated ambulation well with the exception of oxygen saturation decreasing to 77%. Patient's oxygen level increased to 92% when we ambulated back into the room to sit down on the bed.  Will continue to monitor.  Catherine Christensen  11/15/2018  12:43 AM

## 2018-11-15 NOTE — Consult Note (Addendum)
PHARMACY - ADULT TOTAL PARENTERAL NUTRITION CONSULT NOTE   Pharmacy Consult for TPN  Indication:  Prolonged ileus   Patient Measurements: Height: 5' 7.5" (171.5 cm) Weight: 236 lb 12.4 oz (107.4 kg) IBW/kg (Calculated) : 62.75 TPN AdjBW (KG): 72.8 Body mass index is 36.54 kg/m. Usual Weight: 103 kg  Assessment: 53 yo female found to have two large holes in sigmoid colon and deserosalization injury to segment of small intestine during scheduled laparoscopic hysterectomy. Patient is S/P exploratory laparotomy, primary repair of small intestine injury, and partial colectomy. Patient now with post-operative ileus expected to be prolonged.   GI:  Endo: Hx of DM. A1C: 6.1.  Metformin PTA.  Levemir 10u daily inpatient.   Insulin requirements in the past 24 hours: Aspart 14 units Lytes:   K: 3.5, Phos: 3.5, Mg: 1.9  Renal:Scr: 0.76, CrCl: 105.3 ml/min Hepatobil: ID:Day 6 Abx: IV cefoxitin 2g Every 6 hours for fecal contamination (Changed to Vanc/Zosyn 9/21) WBC: 7.1  TPN Access: PICC placement 9/17 TPN start date:  11/11/18 Nutritional Goals (per RD recommendation on): KCal: 2180-2350 Protein: 105-120 Fluid: 2.1-2.3L/day  Goal TPN rate is 83 mL/hr  ml/hr  Plan:  Continue Clinimix E 5/20 to goal rate of 83 mL/hr x 24 hrs.   Continue 20% ILE at 20 mL/hr x 12 hrs back to qd dosing (TG 128 this am)  Electrolytes: K = 3.5, ordered KCL 10 meq IV x 2 doses (received 1).   Add MVI to TPN daily, trace elements on MWF.  Q4H SSI and adjust as needed. Continue Levemir 10 units daily.   Continue NS at 25 mL/hr (may d/c per pt weight gain - will continue to follow and discuss with Dietician).   Monitor potassium daily and begin checking phosphorus/magnesium every Mon/Thursday.   TGs with Wed AM labs.   F/U Daily   Lu Duffel, PharmD, BCPS Clinical Pharmacist 11/15/2018 10:34 AM

## 2018-11-15 NOTE — Progress Notes (Addendum)
[place] Hospital Day(s): 6.   Post op day(s): 6 Days Post-Op.   Interval History: Patient seen and examined, no acute events or new complaints overnight. Patient reports that she continues to have right > left sided abdominal pain which has gradually improved. Pain medications are helping but she is trying to self wean from PCA. No nausea or emesis. No reports of flatus. NGT with around 500 ccs over last 24 hours. JP with 60 ccs or serous output. No leukocytosis. On TPN.      Vital signs in last 24 hours: [min-max] current  Temp:  [98.2 F (36.8 C)-98.6 F (37 C)] 98.2 F (36.8 C) (09/21 0456) Pulse Rate:  [82-87] 82 (09/21 0456) Resp:  [14-20] 20 (09/21 0456) BP: (137-140)/(70-82) 140/81 (09/21 0456) SpO2:  [94 %-100 %] 99 % (09/21 0456) Weight:  [107.4 kg] 107.4 kg (09/21 0433)     Height: 5' 7.5" (171.5 cm) Weight: 107.4 kg BMI (Calculated): 36.52   Intake/Output last 2 shifts:  09/20 0701 - 09/21 0700 In: 2834.6 [I.V.:2322.9; NG/GT:30; IV Piggyback:481.7] Out: 1685 [Urine:1125; Emesis/NG output:500; Drains:60]   Physical Exam:  Constitutional: alert, cooperative and no distress  Respiratory: breathing non-labored at rest  Cardiovascular: regular rate and sinus rhythm  Gastrointestinal:Soft, incisional soreness, mild distension, No rebound/guarding. JP in RLQ with serous output.  Integumentary: Midline incision is intact with staples. There is what appears to be fibrinous drainage from medial portion of the wound, there is no surrounding erythema. Serosanguinous drainage on inferior dressing.    Labs:  CBC Latest Ref Rng & Units 11/15/2018 11/14/2018 11/12/2018  WBC 4.0 - 10.5 K/uL 6.4 7.1 13.9(H)  Hemoglobin 12.0 - 15.0 g/dL 8.6(L) 8.4(L) 9.7(L)  Hematocrit 36.0 - 46.0 % 26.8(L) 26.8(L) 29.9(L)  Platelets 150 - 400 K/uL 339 319 356   CMP Latest Ref Rng & Units 11/15/2018 11/14/2018 11/14/2018  Glucose 70 - 99 mg/dL 133(H) - 133(H)   BUN 6 - 20 mg/dL 11 - 12  Creatinine 0.44 - 1.00 mg/dL 0.59 - 0.76  Sodium 135 - 145 mmol/L 138 - 138  Potassium 3.5 - 5.1 mmol/L 3.5 3.8 3.2(L)  Chloride 98 - 111 mmol/L 106 - 108  CO2 22 - 32 mmol/L 24 - 24  Calcium 8.9 - 10.3 mg/dL 8.1(L) - 8.1(L)  Total Protein 6.5 - 8.1 g/dL 6.1(L) - 5.9(L)  Total Bilirubin 0.3 - 1.2 mg/dL 0.8 - 0.4  Alkaline Phos 38 - 126 U/L 31(L) - 29(L)  AST 15 - 41 U/L 28 - 27  ALT 0 - 44 U/L 27 - 25     Imaging studies: No new pertinent imaging studies   Assessment/Plan:  53 y.o. female with prolonged post-op ileus awaiting bowel function return 6 Days Post-Op s/p exploratory laparotomy, primary repair of small intestine injury, partial colectomy with primary stapled anastomosisfor intra-operative bowel injury.   - NPO (Okay with sips of clears for comfort) + IVF - Continue NGT decompression with LIS; monitor output             - Continue TPN; advance per dietitain; monitor nutritional labs   - pain control prn; antiemetics prn - monitor abdominal examination; on-going bowel function - mobilization encouraged as tolerates; working with PT - further management per primary service.   All of the above findings and recommendations were discussed with the patient, and the medical team, and all of patient's questions were answered to her expressed satisfaction.  -- Edison Simon, PA-C [facility] 11/15/2018, 7:42 AM [phone] M-F: 7am - 4pm   I saw and evaluated the patient.  Dr. Gilman Schmidt at bedside. Suggested removal of a couple of inferior staples where drainage appears more seropurulent.  It may just be fat necrosis, but this is certainly a high-risk wound for infection. I agree with the above documentation, exam, and plan, which I have edited where appropriate. Fredirick Maudlin  11:11 AM
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
## 2018-11-15 NOTE — Progress Notes (Signed)
Physical Therapy Treatment Patient Details Name: Catherine Christensen MRN: [id number] DOB: Aug 24, 1965 Today's Date: 11/15/2018    History of Present Illness Pt is a 53 year old female admitted s/p hysterectomy.  PMH includes DM, obesity, depression.    PT Comments    Pt agreeable to PT; reports current abdominal pain is 4/10, but pt states it was recently and easily gets up to a 7. Pt does not wish to walk at this time noting she walked earlier and wishes to wait until later in the day to walk again. Pt also reports that she cannot tolerate the abdominal binder at this time due to pain in the incision post staple removal and cleaning of incision. Pt requires use of BSC and wishing to do as much for herself as possible. Pt with heavy use of rails and able to sit to edge of bed with increased time/effort after assist mobilizing LEs out of covers. Pt requires rest breaks between movements to "catch her breath". STS and short ambulation to/from [facility] without AD with Min guard. Pt maintains forward flexed position. Pt agreeable to LE exercises post toileting with set up and supervision; however, pt refuses any exercises involving LEs above the knee. Pt also feels exercises are not necessary as long as she is getting up a couple of times a day. Educated pt on activity benefits, strength losses and prevention. Continue PT to progress strength, endurance to improve quality of functional mobility.   Follow Up Recommendations  Home health PT;Supervision - Intermittent     Equipment Recommendations  Rolling walker with 5" wheels;3in1 (PT)    Recommendations for Other Services       Precautions / Restrictions Precautions Precautions: Fall Precaution Comments: abdominal incision and drain; NG tube Required Braces or Orthoses: Other Brace(Abdominal binder to walk) Restrictions Weight Bearing Restrictions: No    Mobility  Bed Mobility Overal bed mobility: Needs Assistance Bed Mobility: Supine to Sit;Sit to  Supine     Supine to sit: Min assist Sit to supine: Modified independent (Device/Increase time)   General bed mobility comments: Increased time/effort with heavy use of rails. Min A to untangle LEs from covers  Transfers Overall transfer level: Needs assistance Equipment used: None Transfers: Sit to/from Stand Sit to Stand: Min guard            Ambulation/Gait Ambulation/Gait assistance: Min guard Gait Distance (Feet): 2 Feet Assistive device: None       General Gait Details: Bed to/from [facility]   Stairs             Wheelchair Mobility    Modified Rankin (Stroke Patients Only)       Balance Overall balance assessment: Needs assistance Sitting-balance support: Bilateral upper extremity supported;Feet supported Sitting balance-Leahy Scale: Good     Standing balance support: Single extremity supported Standing balance-Leahy Scale: Fair                              Cognition Arousal/Alertness: Awake/alert Behavior During Therapy: WFL for tasks assessed/performed Overall Cognitive Status: Within Functional Limits for tasks assessed                                        Exercises General Exercises - Lower Extremity Ankle Circles/Pumps: AROM;Both;20 reps Quad Sets: Strengthening;Both;20 reps Gluteal Sets: Other (comment)(refuses due to proximity to abdomen) Short Arc Quad: (  refuses) Heel Slides: Other (comment)(refuses) Other Exercises Other Exercises: set up/supervision toileting Other Exercises: refuses any additional exercises due to not wishing to do anything to aggravate abdomen and also feels exercises are unnecessary    General Comments        Pertinent Vitals/Pain Pain Assessment: 0-10 Pain Score: 4  Pain Location: abdomen/incision site Pain Descriptors / Indicators: Constant Pain Intervention(s): Limited activity within patient's tolerance;Monitored during session("I just got it to calm down")    Home  Living                      Prior Function            PT Goals (current goals can now be found in the care plan section) Progress towards PT goals: Progressing toward goals    Frequency    Min 2X/week      PT Plan Current plan remains appropriate    Co-evaluation              AM-PAC PT "6 Clicks" Mobility   Outcome Measure  Help needed turning from your back to your side while in a flat bed without using bedrails?: A Little Help needed moving from lying on your back to sitting on the side of a flat bed without using bedrails?: A Little Help needed moving to and from a bed to a chair (including a wheelchair)?: A Little Help needed standing up from a chair using your arms (e.g., wheelchair or bedside chair)?: A Little Help needed to walk in hospital room?: A Little Help needed climbing 3-5 steps with a railing? : A Lot 6 Click Score: 17    End of Session Equipment Utilized During Treatment: Gait belt Activity Tolerance: Patient limited by pain Patient left: in bed;with call bell/phone within reach   PT Visit Diagnosis: Unsteadiness on feet (R26.81);Muscle weakness (generalized) (M62.81);Difficulty in walking, not elsewhere classified (R26.2);Pain     Time: [id number] PT Time Calculation (min) (ACUTE ONLY): 28 min  Charges:  $Therapeutic Exercise: 8-22 mins $Therapeutic Activity: 8-22 mins                      Larae Grooms, PTA 11/15/2018, 4:51 PM

## 2018-11-16 LAB — BASIC METABOLIC PANEL
Anion gap: 5 (ref 5–15)
BUN: 10 mg/dL (ref 6–20)
CO2: 26 mmol/L (ref 22–32)
Calcium: 8.2 mg/dL — ABNORMAL LOW (ref 8.9–10.3)
Chloride: 106 mmol/L (ref 98–111)
Creatinine, Ser: 0.72 mg/dL (ref 0.44–1.00)
GFR calc Af Amer: >60 mL/min (ref >60)
GFR calc non Af Amer: >60 mL/min (ref >60)
Glucose, Bld: 172 mg/dL — ABNORMAL HIGH (ref 70–99)
Potassium: 3.7 mmol/L (ref 3.5–5.1)
Sodium: 137 mmol/L (ref 135–145)

## 2018-11-16 LAB — GLUCOSE, CAPILLARY
Glucose-Capillary: 156 mg/dL — ABNORMAL HIGH (ref 70–99)
Glucose-Capillary: 179 mg/dL — ABNORMAL HIGH (ref 70–99)
Glucose-Capillary: 133 mg/dL — ABNORMAL HIGH (ref 70–99)
Glucose-Capillary: 120 mg/dL — ABNORMAL HIGH (ref 70–99)
Glucose-Capillary: 133 mg/dL — ABNORMAL HIGH (ref 70–99)
Glucose-Capillary: 128 mg/dL — ABNORMAL HIGH (ref 70–99)

## 2018-11-16 MED ORDER — M.V.I. ADULT IV INJ
INTRAVENOUS | Status: AC
  Administered 2018-11-16: 18:00:00 via INTRAVENOUS
  Filled 2018-11-16: qty 1992

## 2018-11-16 MED ORDER — FAT EMULSION PLANT BASED 20 % IV EMUL
250.0000 mL | INTRAVENOUS | Status: AC
  Administered 2018-11-16: 250 mL via INTRAVENOUS
  Filled 2018-11-16: qty 250

## 2018-11-16 MED ORDER — BUTALBITAL-APAP-CAFFEINE 50-325-40 MG PO TABS
1.0000 | ORAL_TABLET | Freq: Four times a day (QID) | ORAL | Status: DC | PRN
  Administered 2018-11-16 – 2018-11-27 (×3): 1 via ORAL
  Filled 2018-11-16 (×3): qty 1

## 2018-11-16 MED ORDER — ACETAMINOPHEN 500 MG PO TABS
1000.0000 mg | ORAL_TABLET | Freq: Four times a day (QID) | ORAL | Status: DC | PRN
  Filled 2018-11-16: qty 2

## 2018-11-16 NOTE — Consult Note (Signed)
PHARMACY - ADULT TOTAL PARENTERAL NUTRITION CONSULT NOTE   Pharmacy Consult for TPN  Indication:  Prolonged ileus   Patient Measurements: Height: 5' 7.5" (171.5 cm) Weight: 236 lb 12.4 oz (107.4 kg) IBW/kg (Calculated) : 62.75 TPN AdjBW (KG): 72.8 Body mass index is 36.54 kg/m. Usual Weight: 103 kg  Assessment: 53 yo female found to have two large holes in sigmoid colon and deserosalization injury to segment of small intestine during scheduled laparoscopic hysterectomy. Patient is S/P exploratory laparotomy, primary repair of small intestine injury, and partial colectomy. Patient now with post-operative ileus expected to be prolonged.   GI:  Endo: Hx of DM. A1C: 6.1.  Metformin PTA.  Levemir 10u daily inpatient.   Insulin requirements in the past 24 hours: Aspart 14 units Lytes:   K: 3.7, (Phos: 3.5, Mg: 1.9 from 9/21)  Renal:Scr: 0.76, CrCl: 105.3 ml/min Hepatobil: ID:Day 8 Abx: IV cefoxitin 2g Every 6 hours for fecal contamination (Changed to Vanc/Zosyn 9/21)  TPN Access: PICC placement 9/17 TPN start date:  11/11/18 Nutritional Goals (per RD recommendation on): KCal: 2180-2350 Protein: 105-120 Fluid: 2.1-2.3L/day  Goal TPN rate is 83 mL/hr  ml/hr  Plan:  Continue Clinimix E 5/20 to goal rate of 83 mL/hr x 24 hrs.   Continue 20% ILE at 20 mL/hr x 12 hrs back to qd dosing (TG 128 this am)  Electrolytes: K = 3.7, No replenishment warranted today  Add MVI to TPN daily, trace elements on MWF only per availability.  Q4H SSI and adjust as needed. Continue Levemir 10 units daily.   Continue NS at 25 mL/hr (may d/c per pt weight gain - will continue to follow and discuss with Dietician).   Monitor potassium daily and begin checking phosphorus/magnesium every Mon/Thursday.   TGs with Wed AM labs.   F/U Daily   Lu Duffel, PharmD, BCPS Clinical Pharmacist 11/16/2018 9:59 AM
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
## 2018-11-16 NOTE — Progress Notes (Signed)
   11/16/18 1400  Clinical Encounter Type  Visited With Patient  Visit Type Initial;Spiritual support  Referral From Care management  Consult/Referral To Chaplain  Spiritual Encounters  Spiritual Needs Other (Comment)  Chaplain was referred to visit patient by care management. Patient decline visit.

## 2018-11-16 NOTE — Progress Notes (Addendum)
[facility] Hospital Day(s): 7.   Post op day(s): 7 Days Post-Op.   Interval History: Patient seen and examined, no acute events or new complaints overnight. Patient reports that she is feeling pretty good. She reports improvement in her abdominal pain. No fever, chills, nausea, or emesis. Still not passing flatus. She continues to mobilize well with staff. No further complaints.    Vital signs in last 24 hours: [min-max] current  Temp:  [98.3 F (36.8 C)] 98.3 F (36.8 C) (09/22 0412) Pulse Rate:  [83-92] 83 (09/22 0412) Resp:  [18-22] 20 (09/22 0412) BP: (139-141)/(65-87) 141/87 (09/22 0412) SpO2:  [98 %-100 %] 98 % (09/22 0412)     Height: 5' 7.5" (171.5 cm) Weight: 107.4 kg BMI (Calculated): 36.52   Intake/Output last 2 shifts:  09/21 0701 - 09/22 0700 In: 1633.6 [I.V.:977.2; IV Piggyback:656.4] Out: 1330 [Urine:1250; Emesis/NG output:80]   Physical Exam:  Constitutional: alert, cooperative and no distress  HEENT: NGT in place Respiratory: breathing non-labored at rest  Cardiovascular: regular rate and sinus rhythm  Gastrointestinal: soft, expected incisional soreness, and non-distended. No rebound/guarding. JP in RLQ with serous output.  Integumentary: Laparotomy incision with dressing in place - recently cleaned/changed by OB/GYN MD   Labs:  CBC Latest Ref Rng & Units 11/15/2018 11/14/2018 11/12/2018  WBC 4.0 - 10.5 K/uL 6.4 7.1 13.9(H)  Hemoglobin 12.0 - 15.0 g/dL 8.6(L) 8.4(L) 9.7(L)  Hematocrit 36.0 - 46.0 % 26.8(L) 26.8(L) 29.9(L)  Platelets 150 - 400 K/uL 339 319 356   CMP Latest Ref Rng & Units 11/16/2018 11/15/2018 11/14/2018  Glucose 70 - 99 mg/dL 172(H) 133(H) -  BUN 6 - 20 mg/dL 10 11 -  Creatinine 0.44 - 1.00 mg/dL 0.72 0.59 -  Sodium 135 - 145 mmol/L 137 138 -  Potassium 3.5 - 5.1 mmol/L 3.7 3.5 3.8  Chloride 98 - 111 mmol/L 106 106 -  CO2 22 - 32 mmol/L 26 24 -  Calcium 8.9 - 10.3 mg/dL 8.2(L) 8.1(L) -  Total  Protein 6.5 - 8.1 g/dL - 6.1(L) -  Total Bilirubin 0.3 - 1.2 mg/dL - 0.8 -  Alkaline Phos 38 - 126 U/L - 31(L) -  AST 15 - 41 U/L - 28 -  ALT 0 - 44 U/L - 27 -     Imaging studies: No new pertinent imaging studies   Assessment/Plan:  53 y.o. female with prolonged post-op ileus awaiting bowel function return 7 Days Post-Op s/p exploratory laparotomy, primary repair of small intestine injury, partial colectomy with primary stapled anastomosisfor intra-operative bowel injury.   - NPO (Okay with sips of clears for comfort) + IVF - Continue NGT decompression with LIS; monitor output - Continue TPN; advance per dietitain; monitor nutritional labs    - IV Abx (Vancomycin + Zosyn); follow up wound cultures   - monitor abdominal examination; on-going bowel function - mobilization encouraged as tolerates; working with PT - further management per primary service.     All of the above findings and recommendations were discussed with the patient, and the medical team, and all of patient's questions were answered to her expressed satisfaction.  -- Edison Simon, PA-C [facility] 11/16/2018, 7:29 AM [phone] M-F: 7am - 4pm   I saw and evaluated the patient.  I agree with the above documentation, exam, and plan, which I have edited where appropriate. Fredirick Maudlin  8:59 AM

## 2018-11-16 NOTE — Progress Notes (Signed)
Pt. REFUSED  PIV insertion. RN notified.

## 2018-11-17 LAB — GLUCOSE, CAPILLARY
Glucose-Capillary: 133 mg/dL — ABNORMAL HIGH (ref 70–99)
Glucose-Capillary: 138 mg/dL — ABNORMAL HIGH (ref 70–99)
Glucose-Capillary: 139 mg/dL — ABNORMAL HIGH (ref 70–99)
Glucose-Capillary: 145 mg/dL — ABNORMAL HIGH (ref 70–99)
Glucose-Capillary: 149 mg/dL — ABNORMAL HIGH (ref 70–99)
Glucose-Capillary: 150 mg/dL — ABNORMAL HIGH (ref 70–99)

## 2018-11-17 LAB — BASIC METABOLIC PANEL
Anion gap: 7 (ref 5–15)
BUN: 10 mg/dL (ref 6–20)
CO2: 26 mmol/L (ref 22–32)
Calcium: 8.2 mg/dL — ABNORMAL LOW (ref 8.9–10.3)
Chloride: 106 mmol/L (ref 98–111)
Creatinine, Ser: 0.71 mg/dL (ref 0.44–1.00)
GFR calc Af Amer: >60 mL/min (ref >60)
GFR calc non Af Amer: >60 mL/min (ref >60)
Glucose, Bld: 161 mg/dL — ABNORMAL HIGH (ref 70–99)
Potassium: 3.6 mmol/L (ref 3.5–5.1)
Sodium: 139 mmol/L (ref 135–145)

## 2018-11-17 LAB — TRIGLYCERIDES: Triglycerides: 172 mg/dL — ABNORMAL HIGH (ref <150)

## 2018-11-17 MED ORDER — TRACE MINERALS CR-CU-MN-SE-ZN 10-1000-500-60 MCG/ML IV SOLN
INTRAVENOUS | Status: AC
  Administered 2018-11-17: 18:00:00 via INTRAVENOUS
  Filled 2018-11-17: qty 1992

## 2018-11-17 MED ORDER — POTASSIUM CHLORIDE 10 MEQ/100ML IV SOLN
10.0000 meq | INTRAVENOUS | Status: AC
  Administered 2018-11-17 (×2): 10 meq via INTRAVENOUS
  Filled 2018-11-17: qty 100

## 2018-11-17 MED ORDER — SODIUM CHLORIDE 0.9 % IV SOLN
2.0000 g | Freq: Four times a day (QID) | INTRAVENOUS | Status: DC
  Administered 2018-11-17 – 2018-11-27 (×40): 2 g via INTRAVENOUS
  Filled 2018-11-17 (×5): qty 2
  Filled 2018-11-17: qty 2000
  Filled 2018-11-17 (×6): qty 2
  Filled 2018-11-17: qty 2000
  Filled 2018-11-17 (×2): qty 2
  Filled 2018-11-17: qty 2000
  Filled 2018-11-17: qty 2
  Filled 2018-11-17 (×2): qty 2000
  Filled 2018-11-17: qty 2
  Filled 2018-11-17: qty 2000
  Filled 2018-11-17: qty 2
  Filled 2018-11-17 (×2): qty 2000
  Filled 2018-11-17 (×5): qty 2
  Filled 2018-11-17: qty 2000
  Filled 2018-11-17: qty 2
  Filled 2018-11-17: qty 2000
  Filled 2018-11-17 (×2): qty 2
  Filled 2018-11-17 (×3): qty 2000
  Filled 2018-11-17 (×2): qty 2
  Filled 2018-11-17: qty 2000
  Filled 2018-11-17 (×6): qty 2
  Filled 2018-11-17: qty 2000
  Filled 2018-11-17 (×2): qty 2

## 2018-11-17 MED ORDER — FAT EMULSION PLANT BASED 20 % IV EMUL
250.0000 mL | INTRAVENOUS | Status: AC
  Administered 2018-11-17: 250 mL via INTRAVENOUS
  Filled 2018-11-17: qty 250

## 2018-11-17 MED ORDER — OXYCODONE HCL 5 MG PO TABS
5.0000 mg | ORAL_TABLET | ORAL | Status: DC | PRN
  Administered 2018-11-19 – 2018-11-26 (×8): 5 mg via ORAL
  Filled 2018-11-17 (×9): qty 1

## 2018-11-17 MED ORDER — HYDROMORPHONE HCL 1 MG/ML IJ SOLN
0.5000 mg | INTRAMUSCULAR | Status: DC | PRN
  Administered 2018-11-17 – 2018-11-22 (×26): 0.5 mg via INTRAVENOUS
  Filled 2018-11-17 (×25): qty 0.5
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
## 2018-11-17 NOTE — Progress Notes (Signed)
Physical Therapy Treatment Patient Details Name: Catherine Christensen MRN: [id number] DOB: 12/19/1965 Today's Date: 11/17/2018    History of Present Illness Pt is a 53 year old female admitted s/p hysterectomy.  PMH includes DM, obesity, depression.    PT Comments    Pt just finishing bath with NT and very agreeable to ambulating.  Initially started with ambulating using B UE support on IV pole, then progressed to ambulating with single UE support on IV pole, and then progressed to ambulating no UE support.  Pt appearing more cautious ambulating without UE support but no loss of balance noted.  Pt reporting mild discomfort in abdomen d/t "bloating" beginning and end of session but no increase in pain with therapy activities.  Will continue to focus on strengthening, balance, and progressive functional mobility during hospital stay.    Follow Up Recommendations  Home health PT     Equipment Recommendations  Rolling walker with 5" wheels;3in1 (PT)    Recommendations for Other Services       Precautions / Restrictions Precautions Precautions: Fall Precaution Comments: abdominal incision and drain; NG tube Required Braces or Orthoses: Other Brace(abdominal binder to walk) Restrictions Weight Bearing Restrictions: No    Mobility  Bed Mobility Overal bed mobility: Modified Independent Bed Mobility: Supine to Sit     Supine to sit: Modified independent (Device/Increase time)     General bed mobility comments: increased effort to perform; via logrolling; use of bed rail  Transfers Overall transfer level: Needs assistance Equipment used: None Transfers: Sit to/from Stand Sit to Stand: Supervision         General transfer comment: mild increased effort/time to stand from bed and to sit down in recliner end of session  Ambulation/Gait Ambulation/Gait assistance: Min guard Gait Distance (Feet): 540 Feet Assistive device: IV Pole;None   Gait velocity: decreased cadence    General Gait Details: partial step through gait pattern; ambulated 190 feet with B UE support on IV pole; then ambulated 160 feet with single UE support on IV pole; then ambulated 190 feet no UE support   Stairs             Wheelchair Mobility    Modified Rankin (Stroke Patients Only)       Balance Overall balance assessment: Needs assistance Sitting-balance support: No upper extremity supported Sitting balance-Leahy Scale: Good Sitting balance - Comments: steady sitting reaching within BOS   Standing balance support: No upper extremity supported;During functional activity Standing balance-Leahy Scale: Good Standing balance comment: pt more cautious ambulating without UE support but no loss of balance noted                            Cognition Arousal/Alertness: Awake/alert Behavior During Therapy: WFL for tasks assessed/performed Overall Cognitive Status: Within Functional Limits for tasks assessed                                        Exercises      General Comments General comments (skin integrity, edema, etc.): no drainage noted on R lower abdominal drain dressing.  Nursing cleared pt for participation in physical therapy and clamped pt's NG tube beginning of session and returned end of session to reconnect NG tube.  Pt agreeable to PT session.      Pertinent Vitals/Pain Pain Assessment: Faces Faces Pain Scale: Hurts a little bit  Pain Location: bloating discomfort in abdomen Pain Descriptors / Indicators: Guarding Pain Intervention(s): Limited activity within patient's tolerance;Monitored during session;Repositioned;Other (comment)(pt declined pain meds)  HR 84 bpm at rest and increased up to 112 bpm with activity.  O2 sats 96% or greater on room air during session's activities.    Home Living                      Prior Function            PT Goals (current goals can now be found in the care plan section) Acute Rehab  PT Goals Patient Stated Goal: To return to daily activity without pain. PT Goal Formulation: With patient Time For Goal Achievement: 11/24/18 Potential to Achieve Goals: Good Progress towards PT goals: Progressing toward goals    Frequency    Min 2X/week      PT Plan Current plan remains appropriate    Co-evaluation              AM-PAC PT "6 Clicks" Mobility   Outcome Measure  Help needed turning from your back to your side while in a flat bed without using bedrails?: None Help needed moving from lying on your back to sitting on the side of a flat bed without using bedrails?: A Little Help needed moving to and from a bed to a chair (including a wheelchair)?: A Little Help needed standing up from a chair using your arms (e.g., wheelchair or bedside chair)?: A Little Help needed to walk in hospital room?: A Little Help needed climbing 3-5 steps with a railing? : A Little 6 Click Score: 19    End of Session Equipment Utilized During Treatment: Other (comment)(abdominal binder donned for ambulation (doffed end of session)) Activity Tolerance: Patient tolerated treatment well Patient left: in chair;with call bell/phone within reach;with nursing/sitter in room Nurse Communication: Mobility status;Precautions;Other (comment)(Pt's HR and O2 status during session; nurse cleared pt to be up in chair without chair alarm on (pt verbalizing understanding to call nurse prior to getting up for assist)) PT Visit Diagnosis: Unsteadiness on feet (R26.81);Muscle weakness (generalized) (M62.81);Difficulty in walking, not elsewhere classified (R26.2);Pain     Time: [id number] PT Time Calculation (min) (ACUTE ONLY): 24 min  Charges:  $Gait Training: 8-22 mins $Therapeutic Exercise: 8-22 mins                     Leitha Bleak, PT 11/17/18, 9:36 AM [phone]

## 2018-11-17 NOTE — Progress Notes (Signed)
Dilaudid PCA was DC'd.  Beth RN and this primary RN wasted the remaining medication of 21 mL in the vial.

## 2018-11-17 NOTE — Progress Notes (Addendum)
[facility] Hospital Day(s): 8.   Post op day(s): 8 Days Post-Op.   Interval History: Patient seen and examined, no acute events or new complaints overnight. Patient reports she is feeling okay this morning. She notes incisional soreness improved with heating pad. No fever, chills, nausea, emesis. She did note passing two episodes of flatus yesterday afternoon but none this morning. NGT output slowed significantly. She continues to mobilize well with staff and PT. No other acute issues.   Vital signs in last 24 hours: [min-max] current  Temp:  [97.9 F (36.6 C)-98.8 F (37.1 C)] 98.8 F (37.1 C) (09/23 0602) Pulse Rate:  [78-96] 82 (09/23 0602) Resp:  [15-22] 16 (09/23 0602) BP: (114-148)/(64-79) 114/64 (09/23 0602) SpO2:  [96 %-100 %] 99 % (09/23 0602) FiO2 (%):  [97 %-99 %] 99 % (09/23 0412)     Height: 5' 7.5" (171.5 cm) Weight: 107.4 kg BMI (Calculated): 36.52   Intake/Output last 2 shifts:  09/22 0701 - 09/23 0700 In: 2257.6 [I.V.:2002.6; IV Piggyback:255] Out: 900 [Urine:900]   Physical Exam:  Constitutional: alert, cooperative and no distress  HEENT: NGT in place Respiratory: breathing non-labored at rest  Cardiovascular: regular rate and sinus rhythm  Gastrointestinal: soft, expected incisional soreness, and non-distended. No rebound/guarding. JP in RLQ with serous output.  Integumentary: Laparotomy incision with dressing in place - recently cleaned/changed by OB/GYN MD (reviewed note)  Labs:  CBC Latest Ref Rng & Units 11/15/2018 11/14/2018 11/12/2018  WBC 4.0 - 10.5 K/uL 6.4 7.1 13.9(H)  Hemoglobin 12.0 - 15.0 g/dL 8.6(L) 8.4(L) 9.7(L)  Hematocrit 36.0 - 46.0 % 26.8(L) 26.8(L) 29.9(L)  Platelets 150 - 400 K/uL 339 319 356   CMP Latest Ref Rng & Units 11/17/2018 11/16/2018 11/15/2018  Glucose 70 - 99 mg/dL 161(H) 172(H) 133(H)  BUN 6 - 20 mg/dL 10 10 11   Creatinine 0.44 - 1.00 mg/dL 0.71 0.72 0.59  Sodium 135 - 145 mmol/L 139 137  138  Potassium 3.5 - 5.1 mmol/L 3.6 3.7 3.5  Chloride 98 - 111 mmol/L 106 106 106  CO2 22 - 32 mmol/L 26 26 24   Calcium 8.9 - 10.3 mg/dL 8.2(L) 8.2(L) 8.1(L)  Total Protein 6.5 - 8.1 g/dL - - 6.1(L)  Total Bilirubin 0.3 - 1.2 mg/dL - - 0.8  Alkaline Phos 38 - 126 U/L - - 31(L)  AST 15 - 41 U/L - - 28  ALT 0 - 44 U/L - - 27     Imaging studies: No new pertinent imaging studies   Assessment/Plan:  53 y.o. female still awaiting more consistent bowel function return 8 Days Post-Op s/p exploratory laparotomy, primary repair of small intestine injury, partial colectomy with primary stapled anastomosisfor intra-operative bowel injury.   - NPO (Okay with sips of clears for comfort) + IVF - Continue NGT decompression with LIS; monitor output --> likely do clamping trial in the AM (should she fail this, she may need CT imaging) - Continue TPN; advance per dietitain; monitor nutritional labs              - IV Abx (Vancomycin + Zosyn); follow up wound cultures (FEW ENTEROCOCCUS FAECALIS)             - monitor abdominal examination; on-going bowel function - mobilization encouraged as tolerates; working with PT - further management per primary service.    All of the above findings and recommendations were discussed with the patient, and the medical team, and all of patient's questions were  answered to her expressed satisfaction.  -- Edison Simon, PA-C [facility] 11/17/2018, 7:52 AM [phone] M-F: 7am - 4pm  I saw and evaluated the patient.  I agree with the above documentation, exam, and plan, which I have edited where appropriate. Fredirick Maudlin  12:39 PM

## 2018-11-17 NOTE — Consult Note (Signed)
PHARMACY - ADULT TOTAL PARENTERAL NUTRITION CONSULT NOTE   Pharmacy Consult for TPN  Indication:  Prolonged ileus   Patient Measurements: Height: 5' 7.5" (171.5 cm) Weight: 236 lb 12.4 oz (107.4 kg) IBW/kg (Calculated) : 62.75 TPN AdjBW (KG): 72.8 Body mass index is 36.54 kg/m. Usual Weight: 103 kg  Assessment: 53 yo female found to have two large holes in sigmoid colon and deserosalization injury to segment of small intestine during scheduled laparoscopic hysterectomy. Patient is S/P exploratory laparotomy, primary repair of small intestine injury, and partial colectomy. Patient now with post-operative ileus expected to be prolonged.   GI:  Endo: Hx of DM. A1C: 6.1.  Metformin PTA.  Levemir 10u daily inpatient.   Insulin requirements in the past 24 hours: Aspart 14 units Lytes:   K: 3.6, (Phos: 3.5, Mg: 1.9 from 9/21)  Renal:Scr: 0.71, CrCl: 103.5 ml/min Hepatobil: ID:Day 8 Abx: IV cefoxitin 2g Every 6 hours for fecal contamination (Changed to Vanc/Zosyn 9/21)  TPN Access: PICC placement 9/17 TPN start date:  11/11/18 Nutritional Goals (per RD recommendation on): KCal: 2180-2350 Protein: 105-120 Fluid: 2.1-2.3L/day  Goal TPN rate is 83 mL/hr  ml/hr  Plan:  Continue Clinimix E 5/20 to goal rate of 83 mL/hr x 24 hrs.   Continue 20% ILE at 20 mL/hr x 12 hrs back to qd dosing (TG 172 this am)  Electrolytes: K = 3.6, Will replete with KCl IV 9meq x 2  Add MVI to TPN daily, trace elements on MWF only per availability.  Q4H SSI and adjust as needed. Continue Levemir 10 units daily.   Continue NS at 25 mL/hr (may d/c per pt weight gain - will continue to follow and discuss with Dietician).   Monitor potassium daily and begin checking phosphorus/magnesium every Mon/Thursday.   TGs with Mon AM labs.   F/U Daily   Lu Duffel, PharmD, BCPS Clinical Pharmacist 11/17/2018 9:45 AM
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
## 2018-11-18 ENCOUNTER — Encounter: Payer: Self-pay | Admitting: Radiology

## 2018-11-18 ENCOUNTER — Inpatient Hospital Stay: Payer: BC Managed Care – PPO

## 2018-11-18 LAB — COMPREHENSIVE METABOLIC PANEL
ALT: 31 U/L (ref 0–44)
AST: 26 U/L (ref 15–41)
Albumin: 2.9 g/dL — ABNORMAL LOW (ref 3.5–5.0)
Alkaline Phosphatase: 44 U/L (ref 38–126)
Anion gap: 11 (ref 5–15)
BUN: 10 mg/dL (ref 6–20)
CO2: 25 mmol/L (ref 22–32)
Calcium: 8.3 mg/dL — ABNORMAL LOW (ref 8.9–10.3)
Chloride: 101 mmol/L (ref 98–111)
Creatinine, Ser: 0.59 mg/dL (ref 0.44–1.00)
GFR calc Af Amer: >60 mL/min (ref >60)
GFR calc non Af Amer: >60 mL/min (ref >60)
Glucose, Bld: 168 mg/dL — ABNORMAL HIGH (ref 70–99)
Potassium: 3.9 mmol/L (ref 3.5–5.1)
Sodium: 137 mmol/L (ref 135–145)
Total Bilirubin: 0.6 mg/dL (ref 0.3–1.2)
Total Protein: 6.7 g/dL (ref 6.5–8.1)

## 2018-11-18 LAB — GLUCOSE, CAPILLARY
Glucose-Capillary: 108 mg/dL — ABNORMAL HIGH (ref 70–99)
Glucose-Capillary: 142 mg/dL — ABNORMAL HIGH (ref 70–99)
Glucose-Capillary: 153 mg/dL — ABNORMAL HIGH (ref 70–99)
Glucose-Capillary: 155 mg/dL — ABNORMAL HIGH (ref 70–99)
Glucose-Capillary: 168 mg/dL — ABNORMAL HIGH (ref 70–99)
Glucose-Capillary: 173 mg/dL — ABNORMAL HIGH (ref 70–99)

## 2018-11-18 LAB — MAGNESIUM: Magnesium: 2.2 mg/dL (ref 1.7–2.4)

## 2018-11-18 LAB — PHOSPHORUS: Phosphorus: 3.3 mg/dL (ref 2.5–4.6)

## 2018-11-18 MED ORDER — SIMETHICONE 80 MG PO CHEW
80.0000 mg | CHEWABLE_TABLET | Freq: Four times a day (QID) | ORAL | Status: DC
  Filled 2018-11-18 (×4): qty 1

## 2018-11-18 MED ORDER — ONDANSETRON HCL 4 MG/2ML IJ SOLN
4.0000 mg | Freq: Four times a day (QID) | INTRAMUSCULAR | Status: DC
  Administered 2018-11-18: 4 mg via INTRAVENOUS
  Filled 2018-11-18: qty 2

## 2018-11-18 MED ORDER — IOHEXOL 300 MG/ML  SOLN
100.0000 mL | Freq: Once | INTRAMUSCULAR | Status: AC | PRN
  Administered 2018-11-18: 100 mL via INTRAVENOUS

## 2018-11-18 MED ORDER — FAT EMULSION PLANT BASED 20 % IV EMUL
250.0000 mL | INTRAVENOUS | Status: AC
  Administered 2018-11-18: 17:00:00 250 mL via INTRAVENOUS
  Filled 2018-11-18: qty 250

## 2018-11-18 MED ORDER — IOHEXOL 9 MG/ML PO SOLN
500.0000 mL | ORAL | Status: AC
  Administered 2018-11-18: 500 mL via ORAL

## 2018-11-18 MED ORDER — INSULIN DETEMIR 100 UNIT/ML ~~LOC~~ SOLN
15.0000 [IU] | Freq: Every day | SUBCUTANEOUS | Status: DC
  Administered 2018-11-18 – 2018-11-19 (×2): 15 [IU] via SUBCUTANEOUS
  Filled 2018-11-18 (×3): qty 0.15

## 2018-11-18 MED ORDER — SIMETHICONE 80 MG PO CHEW
80.0000 mg | CHEWABLE_TABLET | Freq: Four times a day (QID) | ORAL | Status: DC | PRN
  Administered 2018-11-18 – 2018-11-26 (×4): 80 mg via ORAL
  Filled 2018-11-18 (×5): qty 1

## 2018-11-18 MED ORDER — ONDANSETRON HCL 4 MG/2ML IJ SOLN
4.0000 mg | Freq: Four times a day (QID) | INTRAMUSCULAR | Status: DC | PRN
  Administered 2018-11-19 – 2018-11-22 (×3): 4 mg via INTRAVENOUS
  Filled 2018-11-18 (×3): qty 2

## 2018-11-18 MED ORDER — TRACE MINERALS CR-CU-MN-SE-ZN 10-1000-500-60 MCG/ML IV SOLN
INTRAVENOUS | Status: AC
  Administered 2018-11-18: 17:00:00 via INTRAVENOUS
  Filled 2018-11-18: qty 1992

## 2018-11-18 NOTE — Progress Notes (Addendum)
[facility] Hospital Day(s): 9.   Post op day(s): 9 Days Post-Op.   Interval History: Patient seen and examined, no acute events or new complaints overnight. Patient reports she is feeling good this morning. She notes that she has continued to intermittently passed flatus and had two small liquid/soft BMs yesterday. Still with abdominal discomfort but slowly improving. No nausea or emesis. NGT with 450 out. JP 60 out and serous.   Vital signs in last 24 hours: [min-max] current  Temp:  [97.8 F (36.6 C)-99.7 F (37.6 C)] 97.8 F (36.6 C) (09/24 0442) Pulse Rate:  [80-88] 80 (09/24 0442) Resp:  [16-20] 18 (09/24 0442) BP: (139-152)/(74-93) 152/74 (09/24 0442) SpO2:  [98 %] 98 % (09/24 0442)     Height: 5' 7.5" (171.5 cm) Weight: 107.4 kg BMI (Calculated): 36.52   Intake/Output last 2 shifts:  09/23 0701 - 09/24 0700 In: 1896.3 [I.V.:1766.3; NG/GT:30; IV Piggyback:100] Out: 42 [Urine:1100; Emesis/NG output:450; Drains:60]   Physical Exam:  Constitutional: alert, cooperative and no distress HEENT: NGT in place Respiratory: breathing non-labored at rest  Cardiovascular: regular rate and sinus rhythm  Gastrointestinal: soft,expected incisional soreness, and non-distended. No rebound/guarding. JP in RLQ with serous output. Integumentary:Laparotomy incision with dressing in place - recently cleaned/changed by OB/GYNMD (reviewed note)  Labs:  CBC Latest Ref Rng & Units 11/15/2018 11/14/2018 11/12/2018  WBC 4.0 - 10.5 K/uL 6.4 7.1 13.9(H)  Hemoglobin 12.0 - 15.0 g/dL 8.6(L) 8.4(L) 9.7(L)  Hematocrit 36.0 - 46.0 % 26.8(L) 26.8(L) 29.9(L)  Platelets 150 - 400 K/uL 339 319 356   CMP Latest Ref Rng & Units 11/18/2018 11/17/2018 11/16/2018  Glucose 70 - 99 mg/dL 168(H) 161(H) 172(H)  BUN 6 - 20 mg/dL 10 10 10   Creatinine 0.44 - 1.00 mg/dL 0.59 0.71 0.72  Sodium 135 - 145 mmol/L 137 139 137  Potassium 3.5 - 5.1 mmol/L 3.9 3.6 3.7  Chloride 98 -  111 mmol/L 101 106 106  CO2 22 - 32 mmol/L 25 26 26   Calcium 8.9 - 10.3 mg/dL 8.3(L) 8.2(L) 8.2(L)  Total Protein 6.5 - 8.1 g/dL 6.7 - -  Total Bilirubin 0.3 - 1.2 mg/dL 0.6 - -  Alkaline Phos 38 - 126 U/L 44 - -  AST 15 - 41 U/L 26 - -  ALT 0 - 44 U/L 31 - -     Imaging studies: No new pertinent imaging studies   Assessment/Plan: 53 y.o. female with signs of bowel function return after prolonged ileus 9 Days Post-Op s/p exploratory laparotomy, primary repair of small intestine injury, partial colectomy with primary stapled anastomosisfor intra-operative bowel injury.   - NPO (Okay with sips of clears for comfort) + IVF   - will do NGT clamping trial this morning; clamp NGT for 4 hours, if residuals less than 150-200 ccs then will remove NGT   - Continue TPN; advance per dietitain; monitor nutritional labs - IV Abx (Vancomycin + Zosyn); follow up wound cultures(FEW ENTEROCOCCUS FAECALIS) - monitor abdominal examination; on-going bowel function - mobilization encouraged as tolerates; working with PT - further management per primary service  All of the above findings and recommendations were discussed with the patient, and the medical team, and all of patient's questions were answered to her expressed satisfaction.  -- Edison Simon, PA-C [facility] 11/18/2018, 7:14 AM [phone] M-F: 7am - 4pm   Patient failed NGT clamping trial due to nausea.  Suspect gastric ileus.  Will get CT scan to be certain no  other etiology present.  If CT negative, may try prokinetic agents to aid in resolution of ileus. I saw and evaluated the patient.  I agree with the above documentation, exam, and plan, which I have edited where appropriate. Fredirick Maudlin  2:36 PM

## 2018-11-18 NOTE — Progress Notes (Signed)
Brief note:  Wound cleaned and new packing and dressing applied.    Adrian Prows MD [facility] 11/18/2018 6:19 PM

## 2018-11-18 NOTE — Progress Notes (Signed)
Brief Progress Note Attempted clamping trial this morning given bowel movements and intermittent flatus. However, I was notified by patient's RN that she became nauseous with tube clamped. She was returned to LIS. Given that she has failed clamping trial and is still failing to have consistent return of bowel function we will obtain CT Abdomen/Pelvis with PO and IV contrast to ensure we are not over looking any intra-abdominal etiologies.   Discussed above with Dr Celine Ahr and patient's primary RN.  -- Edison Simon, PA-C [facility] 11/18/2018, 11:56 AM [phone] M-F: 7am - 4pm
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
## 2018-11-18 NOTE — Consult Note (Addendum)
[place] Nurse wound consult note Reason for Consult: [place] team is working remotely today. Consult requested for abd wound.  Discussed plan of care with Dr Gilman Schmidt via phone call.  [place] team will plan to apply Vac dressing tomorrow as requested when we are available on that campus.   Wound type: Full thickness post-op midline abd wound with mod amt tan drainage. Plan of care for the patient is to be discharged with Vac and home health assistance and will require dressing changes to be performed Q M/W/F. Julien Girt MSN, RN, [place], [place], [place]

## 2018-11-18 NOTE — Consult Note (Signed)
PHARMACY - ADULT TOTAL PARENTERAL NUTRITION CONSULT NOTE   Pharmacy Consult for TPN  Indication:  Prolonged ileus   Patient Measurements: Height: 5' 7.5" (171.5 cm) Weight: 236 lb 12.4 oz (107.4 kg) IBW/kg (Calculated) : 62.75 TPN AdjBW (KG): 72.8 Body mass index is 36.54 kg/m. Usual Weight: 103 kg  Assessment: 53 yo female found to have two large holes in sigmoid colon and deserosalization injury to segment of small intestine during scheduled laparoscopic hysterectomy. Patient is S/P exploratory laparotomy, primary repair of small intestine injury, and partial colectomy. Patient now with post-operative ileus expected to be prolonged.   GI: NPO, but having clamp trial today so maybe can start clear liquids by this evening  Endo:  Hx of DM. A1C: 6.1.  Metformin PTA.  Levemir 10u daily inpatient.   Insulin requirements in the past 24 hours: Aspart 13 units  Renal:Scr: 0.71, CrCl: 103.5 ml/min Hepatobil: ID:Day 8 Abx: IV cefoxitin 2g Every 6 hours for fecal contamination (Changed to Vanc/Zosyn 9/21)  TPN Access: PICC placement 9/17 TPN start date:  11/11/18 Nutritional Goals (per RD recommendation on): KCal: 2180-2350 Protein: 105-120 Fluid: 2.1-2.3L/day  Goal TPN rate is 83 mL/hr  ml/hr  Plan:  Continue Clinimix E 5/20 to goal rate of 83 mL/hr x 24 hrs.   Continue 20% ILE at 20 mL/hr x 12 hrs back to qd dosing (TG 172 this am)  Electrolytes: K 3.9, Mg 2.2, Phos 3.3  No replenishment warranted, will monitor electrolytes every Mon/Thursday.    Add MVI and trace elements to TPN daily  Q4H SSI and adjust as needed. Continue Levemir 10 units daily.   Continue NS at 25 mL/hr (may d/c per pt weight gain - will continue to follow and discuss with Dietician).   TGs with Mon AM labs.   F/U Daily   Catherine Christensen, PharmD, BCPS Clinical Pharmacist 11/18/2018 9:57 AM

## 2018-11-18 NOTE — Progress Notes (Signed)
Pt failed NG clamping trial today so Pt. is connected back to Low intermittent suction. Surgical team aware.   Pt ambulated once x 2 laps around nursing station today. Passed gas once. Pt plan to walk again before bedtime.   No other issues and complaints noted at the end of the shift. Pain under control.

## 2018-11-19 ENCOUNTER — Inpatient Hospital Stay: Payer: BC Managed Care – PPO

## 2018-11-19 LAB — CBC
HCT: 28.7 % — ABNORMAL LOW (ref 36.0–46.0)
Hemoglobin: 9.1 g/dL — ABNORMAL LOW (ref 12.0–15.0)
MCH: 26 pg (ref 26.0–34.0)
MCHC: 31.7 g/dL (ref 30.0–36.0)
MCV: 82 fL (ref 80.0–100.0)
Platelets: 513 10*3/uL — ABNORMAL HIGH (ref 150–400)
RBC: 3.5 MIL/uL — ABNORMAL LOW (ref 3.87–5.11)
RDW: 14.5 % (ref 11.5–15.5)
WBC: 8.4 10*3/uL (ref 4.0–10.5)
nRBC: 0.2 % (ref 0.0–0.2)

## 2018-11-19 LAB — GLUCOSE, CAPILLARY
Glucose-Capillary: 114 mg/dL — ABNORMAL HIGH (ref 70–99)
Glucose-Capillary: 127 mg/dL — ABNORMAL HIGH (ref 70–99)
Glucose-Capillary: 132 mg/dL — ABNORMAL HIGH (ref 70–99)
Glucose-Capillary: 138 mg/dL — ABNORMAL HIGH (ref 70–99)
Glucose-Capillary: 165 mg/dL — ABNORMAL HIGH (ref 70–99)
Glucose-Capillary: 86 mg/dL (ref 70–99)

## 2018-11-19 LAB — BASIC METABOLIC PANEL
Anion gap: 10 (ref 5–15)
BUN: 10 mg/dL (ref 6–20)
CO2: 26 mmol/L (ref 22–32)
Calcium: 8.3 mg/dL — ABNORMAL LOW (ref 8.9–10.3)
Chloride: 104 mmol/L (ref 98–111)
Creatinine, Ser: 0.68 mg/dL (ref 0.44–1.00)
GFR calc Af Amer: >60 mL/min (ref >60)
GFR calc non Af Amer: >60 mL/min (ref >60)
Glucose, Bld: 107 mg/dL — ABNORMAL HIGH (ref 70–99)
Potassium: 3.8 mmol/L (ref 3.5–5.1)
Sodium: 140 mmol/L (ref 135–145)

## 2018-11-19 LAB — AEROBIC/ANAEROBIC CULTURE W GRAM STAIN (SURGICAL/DEEP WOUND)

## 2018-11-19 MED ORDER — TRACE MINERALS CR-CU-MN-SE-ZN 10-1000-500-60 MCG/ML IV SOLN
INTRAVENOUS | Status: AC
  Administered 2018-11-19: 19:00:00 via INTRAVENOUS
  Filled 2018-11-19: qty 1992

## 2018-11-19 MED ORDER — FAT EMULSION PLANT BASED 20 % IV EMUL
250.0000 mL | INTRAVENOUS | Status: AC
  Administered 2018-11-19: 250 mL via INTRAVENOUS
  Filled 2018-11-19: qty 250

## 2018-11-19 MED ORDER — ZOLPIDEM TARTRATE 5 MG PO TABS
5.0000 mg | ORAL_TABLET | Freq: Every evening | ORAL | Status: DC | PRN
  Administered 2018-11-20 – 2018-11-27 (×5): 5 mg via ORAL
  Filled 2018-11-19 (×5): qty 1

## 2018-11-19 NOTE — Progress Notes (Addendum)
[place] Hospital Day(s): 10.   Post op day(s): 10 Days Post-Op.   Interval History: Patient seen and examined, no acute events or new complaints overnight. Patient reports that she is very tired this morning and "tired of feeling this way." her abdominal pain is controlled with pain medications. NGT clamping trial attempted yesterday however she became nauseated. CT was obtained which was suggestive of  proximal ileus. No issues with anastomosis. She continues to ambulate well. No evidence of bowel function or flatus. Still on TPN. Plan for wound vac to midline wound this morning.   Vital signs in last 24 hours: [min-max] current  Temp:  [97.8 F (36.6 C)-98.1 F (36.7 C)] 97.9 F (36.6 C) (09/25 0534) Pulse Rate:  [72-84] 75 (09/25 0534) Resp:  [17-19] 17 (09/25 0534) BP: (148-166)/(81-88) 151/81 (09/25 0534) SpO2:  [97 %-99 %] 97 % (09/25 0534) Weight:  [93.1 kg] 93.1 kg (09/25 0421)     Height: 5' 7.5" (171.5 cm) Weight: 93.1 kg BMI (Calculated): 31.65   Intake/Output last 2 shifts:  09/24 0701 - 09/25 0700 In: 2442.7 [I.V.:1995.3; NG/GT:55; IV Piggyback:392.3] Out: 580 [Urine:200; Emesis/NG output:300; Drains:80]   Physical Exam:  Constitutional: alert, cooperative and no distress. She is more upset this morning and tearful.  HEENT: NGT in place Respiratory: breathing non-labored at rest  Cardiovascular: regular rate and sinus rhythm  Gastrointestinal: soft,expected incisional soreness, and non-distended. No rebound/guarding. JP in RLQ with serous output. Integumentary:Laparotomy incision with dressing in place - recently cleaned/changed by OB/GYNMD(reviewed note)   Labs:  CBC Latest Ref Rng & Units 11/19/2018 11/15/2018 11/14/2018  WBC 4.0 - 10.5 K/uL 8.4 6.4 7.1  Hemoglobin 12.0 - 15.0 g/dL 9.1(L) 8.6(L) 8.4(L)  Hematocrit 36.0 - 46.0 % 28.7(L) 26.8(L) 26.8(L)  Platelets 150 - 400 K/uL 513(H) 339 319   CMP Latest Ref Rng  & Units 11/19/2018 11/18/2018 11/17/2018  Glucose 70 - 99 mg/dL 107(H) 168(H) 161(H)  BUN 6 - 20 mg/dL 10 10 10   Creatinine 0.44 - 1.00 mg/dL 0.68 0.59 0.71  Sodium 135 - 145 mmol/L 140 137 139  Potassium 3.5 - 5.1 mmol/L 3.8 3.9 3.6  Chloride 98 - 111 mmol/L 104 101 106  CO2 22 - 32 mmol/L 26 25 26   Calcium 8.9 - 10.3 mg/dL 8.3(L) 8.3(L) 8.2(L)  Total Protein 6.5 - 8.1 g/dL - 6.7 -  Total Bilirubin 0.3 - 1.2 mg/dL - 0.6 -  Alkaline Phos 38 - 126 U/L - 44 -  AST 15 - 41 U/L - 26 -  ALT 0 - 44 U/L - 31 -     Imaging studies:   CT Abdomen/Pelvis (11/18/2018) personally reviewed which shows more proximal ileus and patent anastomosis, no abscesses or other concerning intra-abdominal etiology, and radiologist report reviewed:  IMPRESSION: 1. Multiple dilated loops of small bowel with gradual transition to non-dilated ileum in the right lower quadrant, favoring ileus. 2. Prior left partial colectomy. No perianastomotic fluid collection.   Assessment/Plan:  53 y.o. female with prolonged post-surgical ileus 10 Days Post-Op s/p exploratory laparotomy, primary repair of small intestine injury, partial colectomy with primary stapled anastomosisfor intra-operative bowel injury.   - NPO (Okay with sips of clears for comfort) + IVF   - Continue TPN; monitor nutritional labs  - Continue NGT to LIS; monitor output   -  Consider prokinetic agents (Reglan vs Erythromycin) to help improve bowel motility   - IV Abx (Vancomycin + Zosyn); follow up wound cultures(FEW ENTEROCOCCUS FAECALIS)  -  Plan for wound vac placement to midline incision; appreciate WOC RN help - monitor abdominal examination; on-going bowel function - mobilization encouraged as tolerates; working with PT - further management per primary service  All of the above findings and recommendations were discussed with the patient, and the medical team, and all of patient's questions were answered to  her expressed satisfaction.  -- Edison Simon, PA-C [facility] 11/19/2018, 7:51 AM [phone] M-F: 7am - 4pm   I saw and evaluated the patient.  I agree with the above documentation, exam, and plan, which I have edited where appropriate. Fredirick Maudlin  12:01 PM

## 2018-11-19 NOTE — TOC Progression Note (Signed)
Transition of Care [facility]) - Progression Note    Patient Details  Name: Deziyah Fortes MRN: [id number] Date of Birth: 1965/06/19  [facility]) CM/SW Contact  Beverly Sessions, RN Phone Number: 11/19/2018, 4:58 PM  Clinical Narrative:    Patient will need wound vac at discharge.  Referral made with Brad at [facility] is not longer able to accept patient.  Patient states that she does not have a preference of agency.  Referral made and accepted by Tommi Rumps at [facility]    Expected Discharge Plan: [place] Barriers to Discharge: Continued Medical Work up  Expected Discharge Plan and Services Expected Discharge Plan: [place] In-house Referral: Clinical Social Work   Post Acute Care Choice: [place] arrangements for the past 2 months: [place]                 DME Arranged: Bedside commode, Walker rolling DME Agency: [employer] Date DME Agency Contacted: 11/12/18 Time DME Agency Contacted: [phone] Representative spoke with at DME Agency: [place]: PT, RN [place] Agency: [facility] [employer] Date [place]: 11/12/18 Time [place]: 57 Representative spoke with at [place]: [place] ([place]) Interventions    Readmission Risk Interventions No flowsheet data found.

## 2018-11-19 NOTE — Consult Note (Signed)
[place] CONSULT NOTE   Pharmacy Consult for TPN  Indication:  Prolonged ileus   Patient Measurements: Height: 5' 7.5" (171.5 cm) Weight: 205 lb 3.2 oz (93.1 kg) IBW/kg (Calculated) : 62.75 TPN AdjBW (KG): 72.8 Body mass index is 31.66 kg/m. Usual Weight: 103 kg  Assessment: 53 yo female found to have two large holes in sigmoid colon and deserosalization injury to segment of small intestine during scheduled laparoscopic hysterectomy. Patient is S/P exploratory laparotomy, primary repair of small intestine injury, and partial colectomy. Patient now with post-operative ileus expected to be prolonged.   GI: NPO, but having clamp trial today so maybe can start clear liquids by this evening  Endo:  Hx of DM. A1C: 6.1.  Metformin PTA.  Levemir 10u daily inpatient.   Insulin requirements in the past 24 hours: Aspart 13 units  Renal:Scr: 0.68, CrCl: 103.5 ml/min Hepatobil: ID:Day 9 Abx 9/15 Cefoxitin >>9/20 9/21 Vanc/Zosyn >>9/23 9/23 ampicillin >>  TPN Access: PICC placement 9/17 TPN start date:  11/11/18 Nutritional Goals (per RD recommendation on): KCal: 2180-2350 Protein: 105-120 Fluid: 2.1-2.3L/day  Goal TPN rate is 83 mL/hr  ml/hr  Plan:  Continue Clinimix E 5/20 to goal rate of 83 mL/hr x 24 hrs.   Continue 20% ILE at 20 mL/hr x 12 hrs back to qd dosing (TG 172 this am)  Electrolytes: K 3.8, Mg 2.2/Phos 3.3 (from 9/24 - will recheck tomorrow)  No replenishment needed, will monitor electrolytes every Mon/Thursday and as warranted.    Add MVI and trace elements to TPN daily  Q4H SSI and adjust as needed. Continue Levemir 10 units daily.   Continue NS at 25 mL/hr (may d/c per pt weight gain - will continue to follow and discuss with Dietician).   TGs with Mon AM labs.   F/U Daily   Lu Duffel, PharmD, BCPS Clinical Pharmacist 11/19/2018 10:27 AM
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
## 2018-11-19 NOTE — Consult Note (Signed)
[place] Nurse wound consult note Vac dressing applied to midline abd wound as requested by GYN surgery team. Pt medicated for pain prior to the procedure and tolerated with mod amt discomfort.  Wound type: Staples intact and well-approximated above and below the middle abd wound, slight erythremia surrounding the wound. There is a partial thickness wound in the center of the staples that is red and dry; 2X.5X.1cm. Measurement: Full thickness wound 2X.8X4cm, red and moist, mod amt tan drainage, no odor Dressing procedure/placement/frequency: Removed previous Iodoform gauze packing and applied one piece black foam into the wound, then Mepitel to protect the staple line, then Vac drape.  Another piece of black foam was applied over the drape to allow adequate suction from the track pad.  168mm cont suction applied.   Orders provided for the bedside nurses to apply dry dressing over staples that are not covered by Vac dressing and change Q day. [place] team will change dressing on Mon if patient is still in the hospital at that time. Julien Girt MSN, RN, [place], [place], [place]

## 2018-11-20 LAB — AEROBIC/ANAEROBIC CULTURE W GRAM STAIN (SURGICAL/DEEP WOUND): Special Requests: NORMAL

## 2018-11-20 LAB — GLUCOSE, CAPILLARY
Glucose-Capillary: 125 mg/dL — ABNORMAL HIGH (ref 70–99)
Glucose-Capillary: 135 mg/dL — ABNORMAL HIGH (ref 70–99)
Glucose-Capillary: 144 mg/dL — ABNORMAL HIGH (ref 70–99)
Glucose-Capillary: 155 mg/dL — ABNORMAL HIGH (ref 70–99)
Glucose-Capillary: 87 mg/dL (ref 70–99)
Glucose-Capillary: 88 mg/dL (ref 70–99)

## 2018-11-20 LAB — MAGNESIUM: Magnesium: 2.2 mg/dL (ref 1.7–2.4)

## 2018-11-20 LAB — BASIC METABOLIC PANEL
Anion gap: 8 (ref 5–15)
BUN: 12 mg/dL (ref 6–20)
CO2: 27 mmol/L (ref 22–32)
Calcium: 8.4 mg/dL — ABNORMAL LOW (ref 8.9–10.3)
Chloride: 104 mmol/L (ref 98–111)
Creatinine, Ser: 0.64 mg/dL (ref 0.44–1.00)
GFR calc Af Amer: >60 mL/min (ref >60)
GFR calc non Af Amer: >60 mL/min (ref >60)
Glucose, Bld: 107 mg/dL — ABNORMAL HIGH (ref 70–99)
Potassium: 3.9 mmol/L (ref 3.5–5.1)
Sodium: 139 mmol/L (ref 135–145)

## 2018-11-20 LAB — PHOSPHORUS: Phosphorus: 4.6 mg/dL (ref 2.5–4.6)

## 2018-11-20 MED ORDER — ALPRAZOLAM 0.5 MG PO TABS
0.5000 mg | ORAL_TABLET | Freq: Three times a day (TID) | ORAL | Status: DC | PRN
  Administered 2018-11-21 – 2018-11-26 (×7): 0.5 mg via ORAL
  Filled 2018-11-20 (×7): qty 1

## 2018-11-20 MED ORDER — INSULIN DETEMIR 100 UNIT/ML ~~LOC~~ SOLN
17.0000 [IU] | Freq: Every day | SUBCUTANEOUS | Status: DC
  Filled 2018-11-20 (×2): qty 0.17

## 2018-11-20 MED ORDER — TRACE MINERALS CR-CU-MN-SE-ZN 10-1000-500-60 MCG/ML IV SOLN
INTRAVENOUS | Status: AC
  Administered 2018-11-20: 19:00:00 via INTRAVENOUS
  Filled 2018-11-20: qty 1992

## 2018-11-20 MED ORDER — FAT EMULSION PLANT BASED 20 % IV EMUL
250.0000 mL | INTRAVENOUS | Status: AC
  Administered 2018-11-20: 250 mL via INTRAVENOUS
  Filled 2018-11-20: qty 250

## 2018-11-20 MED ORDER — INSULIN ASPART 100 UNIT/ML ~~LOC~~ SOLN
0.0000 [IU] | Freq: Four times a day (QID) | SUBCUTANEOUS | Status: DC
  Administered 2018-11-21 (×2): 1 [IU] via SUBCUTANEOUS
  Administered 2018-11-21: 2 [IU] via SUBCUTANEOUS
  Administered 2018-11-21 – 2018-11-22 (×2): 1 [IU] via SUBCUTANEOUS
  Administered 2018-11-22: 2 [IU] via SUBCUTANEOUS
  Administered 2018-11-22: 1 [IU] via SUBCUTANEOUS
  Administered 2018-11-23: 2 [IU] via SUBCUTANEOUS
  Administered 2018-11-23 – 2018-11-25 (×6): 1 [IU] via SUBCUTANEOUS
  Filled 2018-11-20 (×14): qty 1

## 2018-11-20 NOTE — Progress Notes (Signed)
11/20/2018  Subjective: Patient is 11 Days Post-Op.  No acute events overnight.  She failed clamping trial 2 days ago due to nausea.  NG tube was replaced yesterday after it slipped out.  Denies any significant flatus and no bowel movements.  Pain is well controlled.  She has not been walking too much and only walked twice yesterday.  Vital signs: Temp:  [98.2 F (36.8 C)-98.4 F (36.9 C)] 98.2 F (36.8 C) (09/26 1230) Pulse Rate:  [83-88] 86 (09/26 1230) Resp:  [17-20] 17 (09/26 1230) BP: (131-160)/(65-84) 135/84 (09/26 1230) SpO2:  [96 %-99 %] 99 % (09/26 1230)   Intake/Output: 09/25 0701 - 09/26 0700 In: 626 [I.V.:626] Out: -  Last BM Date: 11/17/18  Physical Exam: Constitutional: No acute distress Abdomen: Soft, obese, nondistended, with tenderness to palpation in the midline incision particular in the inferior pole.  Wound VAC in place with good seal.  No evidence of worsening infection at this point is been treated well with antibiotics and the wound VAC.  Labs:  Recent Labs    11/19/18 0659  WBC 8.4  HGB 9.1*  HCT 28.7*  PLT 513*   Recent Labs    11/19/18 0659 11/20/18 0506  NA 140 139  K 3.8 3.9  CL 104 104  CO2 26 27  GLUCOSE 107* 107*  BUN 10 12  CREATININE 0.68 0.64  CALCIUM 8.3* 8.4*   No results for input(s): LABPROT, INR in the last 72 hours.  Imaging: No results found.  Assessment/Plan: This is a 53 y.o. female s/p total abdominal hysterectomy with right salpingectomy complicated by bowel perforation requiring a partial colectomy with primary anastomosis.  -Unfortunately patient still having a prolonged ileus and failed NG tube clamping trial 2 days ago.  At this point would continue waiting for return of bowel function before attempting further trials.  She did have a CT scan on 9/24 which did not show any evidence of obstruction or complications with the primary anastomosis.  At this point we will continue watchful waiting. - Reinforced with the  patient that she should ambulate as this can help also with bowel function as well as with her lung function.  Also discussed the potential for chewing bubblegum to help her with bowel function and motility. -Continue n.p.o., NG tube, and TPN.   Melvyn Neth, [facility]

## 2018-11-20 NOTE — Plan of Care (Signed)
  Problem: Health Behavior/Discharge Planning: Goal: Ability to manage health-related needs will improve Outcome: Progressing   Problem: Activity: Goal: Risk for activity intolerance will decrease Outcome: Progressing     Patient is physically feeling better. Able to ambulate to the bathroom with minimal assistance. Bathed independently. Did not ambulate in hall due to abdominal pain and nauseau

## 2018-11-20 NOTE — Consult Note (Signed)
[place] CONSULT NOTE   Pharmacy Consult for TPN  Indication:  Prolonged ileus   Patient Measurements: Height: 5' 7.5" (171.5 cm) Weight: 205 lb 3.2 oz (93.1 kg) IBW/kg (Calculated) : 62.75 TPN AdjBW (KG): 72.8 Body mass index is 31.66 kg/m. Usual Weight: 103 kg  Assessment: 53 yo female found to have two large holes in sigmoid colon and deserosalization injury to segment of small intestine during scheduled laparoscopic hysterectomy. Patient is S/P exploratory laparotomy, primary repair of small intestine injury, and partial colectomy. Patient now with post-operative ileus expected to be prolonged.   GI: NPO with sips of clears for comfort  Endo:  Hx of DM. A1C: 6.1.  Metformin PTA.  Levemir 15u at bedtime currently inpatient.   Insulin requirements in the past 24 hours: Aspart 8 units  Renal:Scr: 0.64, CrCl: 39ml/min Hepatobil: ID: Abx 9/15 Cefoxitin >>9/20 9/21 Vanc/Zosyn >>9/23 9/23 ampicillin >>  TPN Access: PICC placement 9/17 TPN start date:  11/11/18 Nutritional Goals (per RD recommendation on): KCal: 2180-2350 Protein: 105-120 Fluid: 2.1-2.3L/day  Goal TPN rate is 83 mL/hr  ml/hr  Plan:  Continue Clinimix E 5/20 at goal rate of 83 mL/hr x 24 hrs.   Continue 20% ILE at 20 mL/hr x 12 hrs back to qd dosing (TG 172 this am 9/23)  Electrolytes: K 3.9, Mg 2.2 Phos 4.6 Scr 0.64  No replenishment needed, will monitor electrolytes every Mon/Thursday and as warranted.    Add MVI and trace elements to TPN daily  Will adjust SSI to q6h 0-9 units and adjust as needed. Will adjust Levemir to 17 units at bedtime  Continue NS at 25 mL/hr (may d/c per pt weight gain - will continue to follow and discuss with Dietician).   TGs with Mon AM labs.   F/U Daily   Noralee Space, PharmD, BCPS Clinical Pharmacist 11/20/2018 10:42 AM

## 2018-11-20 NOTE — Progress Notes (Signed)
MD called due to patient having a blood glucose of 87 with TPN going at 92ml/hr. Patient currently npo sips with meds. Levimir 17 units due. This RN concerned of blood glucose dropping during the night. MD states to [place] for now

## 2018-11-20 NOTE — Progress Notes (Signed)
This RN (precepted by Lewie Chamber, RN) was called by patient, and patient stated she wanted to go for a walk. Terri RN off unit for break and this RN asked patient to wait so we could make sure the NG was capped safely. Patient began crying stating "I can't wait another minute, I need my pain medication now." Writer informed patient she could get the patient her pain medication immediatly. Patient agreeable. While writer was in med room pulling medication, patient called and stated "If you don't know how to walk me I don't trust you to give me pain medications. I want Karna Christmas, just send her in when she gets back." Repeated back to patient her wishes and patient agreeable to wait for Terri. Patient restated, "You can't do anything for me, just wait." Will alert charge nurse and monitor.
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
## 2018-11-21 LAB — GLUCOSE, CAPILLARY
Glucose-Capillary: 128 mg/dL — ABNORMAL HIGH (ref 70–99)
Glucose-Capillary: 129 mg/dL — ABNORMAL HIGH (ref 70–99)
Glucose-Capillary: 130 mg/dL — ABNORMAL HIGH (ref 70–99)
Glucose-Capillary: 131 mg/dL — ABNORMAL HIGH (ref 70–99)
Glucose-Capillary: 139 mg/dL — ABNORMAL HIGH (ref 70–99)
Glucose-Capillary: 141 mg/dL — ABNORMAL HIGH (ref 70–99)
Glucose-Capillary: 151 mg/dL — ABNORMAL HIGH (ref 70–99)

## 2018-11-21 MED ORDER — FAT EMULSION PLANT BASED 20 % IV EMUL
250.0000 mL | INTRAVENOUS | Status: AC
  Administered 2018-11-21: 250 mL via INTRAVENOUS
  Filled 2018-11-21: qty 250

## 2018-11-21 MED ORDER — TRACE MINERALS CR-CU-MN-SE-ZN 10-1000-500-60 MCG/ML IV SOLN
INTRAVENOUS | Status: AC
  Administered 2018-11-21: 17:00:00 via INTRAVENOUS
  Filled 2018-11-21: qty 1992

## 2018-11-21 MED ORDER — HYDROMORPHONE HCL 1 MG/ML IJ SOLN
INTRAMUSCULAR | Status: AC
  Filled 2018-11-21: qty 1

## 2018-11-21 MED ORDER — INSULIN DETEMIR 100 UNIT/ML ~~LOC~~ SOLN
15.0000 [IU] | Freq: Every day | SUBCUTANEOUS | Status: DC
  Administered 2018-11-21 – 2018-11-27 (×7): 15 [IU] via SUBCUTANEOUS
  Filled 2018-11-21 (×8): qty 0.15

## 2018-11-21 NOTE — Consult Note (Signed)
[place] NOTE   Pharmacy Consult for TPN  Indication:  Prolonged ileus   Patient Measurements: Height: 5' 7.5" (171.5 cm) Weight: (patient refused this am) IBW/kg (Calculated) : 62.75 TPN AdjBW (KG): 72.8 Body mass index is 31.66 kg/m. Usual Weight: 103 kg  Assessment: 53 yo female found to have two large holes in sigmoid colon and deserosalization injury to segment of small intestine during scheduled laparoscopic hysterectomy. Patient is S/P exploratory laparotomy, primary repair of small intestine injury, and partial colectomy. Patient now with post-operative ileus expected to be prolonged.   GI: NPO with sips of clears for comfort  Endo:  Hx of DM. A1C: 6.1.  Metformin PTA.  Levemir 17u at bedtime currently inpatient.   Insulin requirements in the past 24 hours: Aspart 5 units  Renal:Scr: 0.64, CrCl: 35ml/min Hepatobil: ID: Abx 9/15 Cefoxitin >>9/20 9/21 Vanc/Zosyn >>9/23 9/23 ampicillin >>  TPN Access: PICC placement 9/17 TPN start date:  11/11/18 Nutritional Goals (per RD recommendation on): KCal: 2180-2350 Protein: 105-120 Fluid: 2.1-2.3L/day  Goal TPN rate is 83 mL/hr  ml/hr  Plan:  Continue Clinimix E 5/20 at goal rate of 83 mL/hr x 24 hrs.   Continue 20% ILE at 20 mL/hr x 12 hrs back to qd dosing (TG 172 this am 9/23)  Electrolytes: will monitor electrolytes every Mon/Thursday and as warranted.    Add MVI and trace elements to TPN daily  On SSI to q6h 0-9 units and adjust as needed. Levemir was held by RN last night 9/26 for BG 87. BG now 151, 139. Will adjust dose back to 15 units and order daily to give dose before bedtime today as BG likley to continue to increase.  Continue NS at 25 mL/hr (may d/c per pt weight gain - will continue to follow and discuss with Dietician).   TGs with Mon AM labs.   F/U Daily   Noralee Space, PharmD, BCPS Clinical Pharmacist 11/21/2018 10:50 AM
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
## 2018-11-21 NOTE — Progress Notes (Signed)
Patient ambulated from her room to and around the nurse's station on two separate occasions - 4 times around each occasion.

## 2018-11-21 NOTE — Progress Notes (Signed)
11/21/2018  Subjective: Patient is 12 Days Post-Op s/p total abdominal hysterectomy, partial colectomy.  No acute events.  Patient reports that her lower midline abdominal pain is much better today.  She does seem in better spirits.  She just got back from a walk around the nurses' station.  No flatus yet, but NG output has been low.  Vital signs: Temp:  [98.2 F (36.8 C)-98.9 F (37.2 C)] 98.4 F (36.9 C) (09/27 0346) Pulse Rate:  [79-90] 90 (09/27 0346) Resp:  [17-20] 20 (09/27 0346) BP: (135-146)/(84-89) 142/89 (09/27 0346) SpO2:  [96 %-99 %] 96 % (09/27 0346)   Intake/Output: 09/26 0701 - 09/27 0700 In: 3789.8 [I.V.:3049.2; IV Piggyback:740.7] Out: 795 [Urine:400; Emesis/NG output:350; Drains:45] Last BM Date: 11/17/18  Physical Exam: Constitutional: No acute distress Abdomen:  Soft, obese, non-distended, currently non-tender to palpation.  Midline incision clean, dry, with combination of staples and wound vac dressing.  Labs:  Recent Labs    11/19/18 0659  WBC 8.4  HGB 9.1*  HCT 28.7*  PLT 513*   Recent Labs    11/19/18 0659 11/20/18 0506  NA 140 139  K 3.8 3.9  CL 104 104  CO2 26 27  GLUCOSE 107* 107*  BUN 10 12  CREATININE 0.68 0.64  CALCIUM 8.3* 8.4*   No results for input(s): LABPROT, INR in the last 72 hours.  Imaging: No results found.  Assessment/Plan: This is a 53 y.o. female s/p total abdominal hysterectomy, partial colectomy  --Continue NPO and TPN for nutrition.  NG output has been low and her pain is much better today. --Awaiting return of bowel function --Continue ambulation.   Melvyn Neth, [facility]

## 2018-11-22 LAB — CBC
HCT: 29.6 % — ABNORMAL LOW (ref 36.0–46.0)
Hemoglobin: 9.3 g/dL — ABNORMAL LOW (ref 12.0–15.0)
MCH: 25.8 pg — ABNORMAL LOW (ref 26.0–34.0)
MCHC: 31.4 g/dL (ref 30.0–36.0)
MCV: 82 fL (ref 80.0–100.0)
Platelets: 532 10*3/uL — ABNORMAL HIGH (ref 150–400)
RBC: 3.61 MIL/uL — ABNORMAL LOW (ref 3.87–5.11)
RDW: 14.6 % (ref 11.5–15.5)
WBC: 6.6 10*3/uL (ref 4.0–10.5)
nRBC: 0 % (ref 0.0–0.2)

## 2018-11-22 LAB — DIFFERENTIAL
Abs Immature Granulocytes: 0.07 10*3/uL (ref 0.00–0.07)
Basophils Absolute: 0 10*3/uL (ref 0.0–0.1)
Basophils Relative: 0 %
Eosinophils Absolute: 0.1 10*3/uL (ref 0.0–0.5)
Eosinophils Relative: 1 %
Immature Granulocytes: 1 %
Lymphocytes Relative: 23 %
Lymphs Abs: 1.5 10*3/uL (ref 0.7–4.0)
Monocytes Absolute: 0.9 10*3/uL (ref 0.1–1.0)
Monocytes Relative: 14 %
Neutro Abs: 4 10*3/uL (ref 1.7–7.7)
Neutrophils Relative %: 61 %

## 2018-11-22 LAB — COMPREHENSIVE METABOLIC PANEL
ALT: 48 U/L — ABNORMAL HIGH (ref 0–44)
AST: 33 U/L (ref 15–41)
Albumin: 3 g/dL — ABNORMAL LOW (ref 3.5–5.0)
Alkaline Phosphatase: 55 U/L (ref 38–126)
Anion gap: 7 (ref 5–15)
BUN: 13 mg/dL (ref 6–20)
CO2: 26 mmol/L (ref 22–32)
Calcium: 8.7 mg/dL — ABNORMAL LOW (ref 8.9–10.3)
Chloride: 104 mmol/L (ref 98–111)
Creatinine, Ser: 0.7 mg/dL (ref 0.44–1.00)
GFR calc Af Amer: >60 mL/min (ref >60)
GFR calc non Af Amer: >60 mL/min (ref >60)
Glucose, Bld: 115 mg/dL — ABNORMAL HIGH (ref 70–99)
Potassium: 3.7 mmol/L (ref 3.5–5.1)
Sodium: 137 mmol/L (ref 135–145)
Total Bilirubin: 0.5 mg/dL (ref 0.3–1.2)
Total Protein: 7.3 g/dL (ref 6.5–8.1)

## 2018-11-22 LAB — GLUCOSE, CAPILLARY
Glucose-Capillary: 129 mg/dL — ABNORMAL HIGH (ref 70–99)
Glucose-Capillary: 152 mg/dL — ABNORMAL HIGH (ref 70–99)
Glucose-Capillary: 156 mg/dL — ABNORMAL HIGH (ref 70–99)

## 2018-11-22 LAB — PREALBUMIN: Prealbumin: 25.1 mg/dL (ref 18–38)

## 2018-11-22 LAB — TRIGLYCERIDES: Triglycerides: 140 mg/dL (ref <150)

## 2018-11-22 LAB — MAGNESIUM: Magnesium: 2 mg/dL (ref 1.7–2.4)

## 2018-11-22 LAB — PHOSPHORUS: Phosphorus: 4.2 mg/dL (ref 2.5–4.6)

## 2018-11-22 MED ORDER — KETOROLAC TROMETHAMINE 30 MG/ML IJ SOLN
30.0000 mg | Freq: Four times a day (QID) | INTRAMUSCULAR | Status: DC
  Administered 2018-11-22 – 2018-11-27 (×19): 30 mg via INTRAVENOUS
  Filled 2018-11-22 (×19): qty 1

## 2018-11-22 MED ORDER — FAT EMULSION PLANT BASED 20 % IV EMUL
250.0000 mL | INTRAVENOUS | Status: AC
  Administered 2018-11-22: 250 mL via INTRAVENOUS
  Filled 2018-11-22: qty 250

## 2018-11-22 MED ORDER — TRACE MINERALS CR-CU-MN-SE-ZN 10-1000-500-60 MCG/ML IV SOLN
INTRAVENOUS | Status: AC
  Administered 2018-11-22: 20:00:00 via INTRAVENOUS
  Filled 2018-11-22: qty 1992

## 2018-11-22 MED ORDER — MORPHINE SULFATE (PF) 2 MG/ML IV SOLN
2.0000 mg | INTRAVENOUS | Status: DC | PRN
  Administered 2018-11-22 – 2018-11-23 (×2): 2 mg via INTRAVENOUS
  Administered 2018-11-23 (×2): 4 mg via INTRAVENOUS
  Administered 2018-11-23 – 2018-11-26 (×8): 2 mg via INTRAVENOUS
  Filled 2018-11-22: qty 2
  Filled 2018-11-22 (×2): qty 1
  Filled 2018-11-22: qty 2
  Filled 2018-11-22 (×8): qty 1

## 2018-11-22 NOTE — Progress Notes (Signed)
11/22/2018  Subjective: Patient is 13 Days Post-Op.  Patient reports she had a small bowel movement this morning.  Reports her pain had improved.  Low NG output.  Appears tired this morning.  Vital signs: Temp:  [97.8 F (36.6 C)-98.5 F (36.9 C)] 98.5 F (36.9 C) (09/28 0513) Pulse Rate:  [81-95] 89 (09/28 0513) Resp:  [20] 20 (09/28 0513) BP: (122-158)/(83-97) 147/86 (09/28 0513) SpO2:  [97 %-100 %] 97 % (09/28 0513)   Intake/Output: 09/27 0701 - 09/28 0700 In: 3630.3 [I.V.:3100.3; NG/GT:30; IV Piggyback:500] Out: 400 [Emesis/NG output:400] Last BM Date: 11/17/18  Physical Exam: Constitutional: No acute distress Abdomen:  Soft, obese, non-distended, with appropriate soreness to palpation.  Midline incision with wound vac in mid portion and staples above and below it, all intact, without erythema.  Labs:  Recent Labs    11/22/18 0539  WBC 6.6  HGB 9.3*  HCT 29.6*  PLT 532*   Recent Labs    11/20/18 0506 11/22/18 0539  NA 139 137  K 3.9 3.7  CL 104 104  CO2 27 26  GLUCOSE 107* 115*  BUN 12 13  CREATININE 0.64 0.70  CALCIUM 8.4* 8.7*   No results for input(s): LABPROT, INR in the last 72 hours.  Imaging: No results found.  Assessment/Plan: This is a 53 y.o. female s/p hysterectomy and partial sigmoidectomy for perforation and primary small bowel repair.  --Discussed with patient about options for today given that she had a bowel movement.  She stressed to [place] again that she's very tired of her NG tube.  Started clamping trial again this morning.  If residuals are low, will talk with patient about removing NG tube vs possibly starting clear liquid diet with the tube in place but clamped to see how she tolerates. --continue ambulation, pain control, TPN. --wound vac dressing to be changed today by wound RN.   Melvyn Neth, [facility]

## 2018-11-22 NOTE — Consult Note (Signed)
[place] NOTE   Pharmacy Consult for TPN  Indication:  Prolonged ileus   Patient Measurements: Height: 5' 7.5" (171.5 cm) Weight: (patient refused this am) IBW/kg (Calculated) : 62.75 TPN AdjBW (KG): 72.8 Body mass index is 31.66 kg/m. Usual Weight: 103 kg  Assessment: 53 yo female found to have two large holes in sigmoid colon and deserosalization injury to segment of small intestine during scheduled laparoscopic hysterectomy. Patient is S/P exploratory laparotomy, primary repair of small intestine injury, and partial colectomy. Patient now with post-operative ileus expected to be prolonged.   GI: NPO with sips of clears for comfort  Endo:  Hx of DM. A1C: 6.1.  Metformin PTA.  Levemir 15u at bedtime currently inpatient.   Insulin requirements in the past 24 hours: Aspart 4 units  Renal:Scr: 0.70, CrCl: 13ml/min Hepatobil: ID: Abx 9/15 Cefoxitin >>9/20 9/21 Vanc/Zosyn >>9/23 9/23 ampicillin >>  TPN Access: PICC placement 9/17 TPN start date:  11/11/18 Nutritional Goals (per RD recommendation on): KCal: 2180-2350 Protein: 105-120 Fluid: 2.1-2.3L/day  Goal TPN rate is 83 mL/hr  ml/hr  Plan:  Continue Clinimix E 5/20 at goal rate of 83 mL/hr x 24 hrs.   Continue 20% ILE at 20 mL/hr x 12 hrs back to qd dosing (TG 172 this am 9/23)  Electrolytes: will monitor electrolytes every Mon/Thursday and as warranted.    Add MVI and trace elements to TPN daily  On SSI to q6h 0-9 units and adjust as needed. Levemir 15 units daily. BG 115-139  - NS at 25 mL/hr (may d/c per pt weight gain - will continue to follow and discuss with Dietician).   TGs with Mon labs (140 on 9/28).   F/U Daily   Noralee Space, PharmD, BCPS Clinical Pharmacist 11/22/2018 8:54 AM

## 2018-11-22 NOTE — Consult Note (Signed)
[place] Nurse wound follow up Patient receiving care in [facility] [address] Wound type: midline abdominal surgical wound Measurement: deferred Wound bed: 100% red granulation tissue Drainage (amount, consistency, odor) serosanginous in cannister Periwound: intact with staples Dressing procedure/placement/frequency: one piece of black foam removed, one piece place. Immediate seal obtained. Patient tolerated very well. Val Riles, RN, MSN, CWOCN, CNS-BC, pager [phone]

## 2018-11-22 NOTE — Progress Notes (Signed)
Physical Therapy Treatment Patient Details Name: Catherine Christensen MRN: [id number] DOB: 1965/09/29 Today's Date: 11/22/2018    History of Present Illness Pt is a 53 year old female admitted s/p hysterectomy.  PMH includes DM, obesity, depression.    PT Comments    Pt reporting feeling weak today but agreeable to ambulation.  Pt able to ambulate 1x around nursing loop with single UE support on IV pole and then walk 4 more laps around nursing loop without any UE support (pt appearing a little more cautious but no loss of balance noted).  Pt reporting no pain during session and overall appeared to tolerate mobility well.  Will continue to focus on strengthening, balance, and progressive functional mobility during hospitalization.    Follow Up Recommendations  Outpatient PT     Equipment Recommendations  3in1 (PT)    Recommendations for Other Services       Precautions / Restrictions Precautions Precautions: Fall Precaution Comments: abdominal incision and drain; NG tube; wound vac Required Braces or Orthoses: Other Brace Other Brace: abdominal binder to walk Restrictions Weight Bearing Restrictions: No    Mobility  Bed Mobility Overal bed mobility: Modified Independent       Supine to sit: Modified independent (Device/Increase time)     General bed mobility comments: increased effort to perform; via logrolling; use of bed rail  Transfers Overall transfer level: Independent Equipment used: None Transfers: Sit to/from Stand Sit to Stand: Independent         General transfer comment: mild increased effort/time to stand and also sit; steady  Ambulation/Gait Ambulation/Gait assistance: Supervision;Min guard Gait Distance (Feet): (860) Assistive device: IV Pole;None   Gait velocity: mild decreased cadence   General Gait Details: mostly step through gait pattern; first 190 feet with single UE support on IV pole and rest with no UE support   Stairs              Wheelchair Mobility    Modified Rankin (Stroke Patients Only)       Balance Overall balance assessment: Modified Independent Sitting-balance support: No upper extremity supported;Feet supported Sitting balance-Leahy Scale: Normal Sitting balance - Comments: steady sitting reaching outside BOS   Standing balance support: No upper extremity supported;During functional activity Standing balance-Leahy Scale: Good Standing balance comment: pt appearing a little more cautious with functional mobility (without UE support) but no loss of balance noted                            Cognition Arousal/Alertness: Awake/alert Behavior During Therapy: WFL for tasks assessed/performed Overall Cognitive Status: Within Functional Limits for tasks assessed                                        Exercises      General Comments General comments (skin integrity, edema, etc.): no drainage noted on R lower abdominal drain dressing beginning/end of session.  Pt agreeable to PT session.      Pertinent Vitals/Pain Pain Assessment: No/denies pain Pain Intervention(s): Limited activity within patient's tolerance;Monitored during session;Repositioned  Vitals (HR and O2 on room air) stable and WFL throughout treatment session.    Home Living                      Prior Function  PT Goals (current goals can now be found in the care plan section) Acute Rehab PT Goals Patient Stated Goal: To return to daily activity without pain. PT Goal Formulation: With patient Time For Goal Achievement: 11/24/18 Potential to Achieve Goals: Good Progress towards PT goals: Progressing toward goals    Frequency    Min 2X/week      PT Plan Discharge plan needs to be updated    Co-evaluation              AM-PAC PT "6 Clicks" Mobility   Outcome Measure  Help needed turning from your back to your side while in a flat bed without using bedrails?:  None Help needed moving from lying on your back to sitting on the side of a flat bed without using bedrails?: None Help needed moving to and from a bed to a chair (including a wheelchair)?: None Help needed standing up from a chair using your arms (e.g., wheelchair or bedside chair)?: None Help needed to walk in hospital room?: A Little Help needed climbing 3-5 steps with a railing? : A Little 6 Click Score: 22    End of Session Equipment Utilized During Treatment: Other (comment)(abdominal binder donned for ambulation and doffed end of session) Activity Tolerance: Patient tolerated treatment well Patient left: with call bell/phone within reach(sitting edge of bed) Nurse Communication: Mobility status;Precautions PT Visit Diagnosis: Unsteadiness on feet (R26.81);Muscle weakness (generalized) (M62.81);Difficulty in walking, not elsewhere classified (R26.2);Pain     Time: 1525-1559 PT Time Calculation (min) (ACUTE ONLY): 34 min  Charges:  $Therapeutic Exercise: 23-37 mins                    Leitha Bleak, PT 11/22/18, 4:19 PM [phone]
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
## 2018-11-23 LAB — GLUCOSE, CAPILLARY
Glucose-Capillary: 111 mg/dL — ABNORMAL HIGH (ref 70–99)
Glucose-Capillary: 116 mg/dL — ABNORMAL HIGH (ref 70–99)
Glucose-Capillary: 130 mg/dL — ABNORMAL HIGH (ref 70–99)
Glucose-Capillary: 137 mg/dL — ABNORMAL HIGH (ref 70–99)
Glucose-Capillary: 139 mg/dL — ABNORMAL HIGH (ref 70–99)

## 2018-11-23 LAB — CREATININE, SERUM
Creatinine, Ser: 0.68 mg/dL (ref 0.44–1.00)
GFR calc Af Amer: >60 mL/min (ref >60)
GFR calc non Af Amer: >60 mL/min (ref >60)

## 2018-11-23 MED ORDER — FAT EMULSION PLANT BASED 20 % IV EMUL
250.0000 mL | INTRAVENOUS | Status: AC
  Administered 2018-11-23: 250 mL via INTRAVENOUS
  Filled 2018-11-23: qty 250

## 2018-11-23 MED ORDER — TRACE MINERALS CR-CU-MN-SE-ZN 10-1000-500-60 MCG/ML IV SOLN
INTRAVENOUS | Status: AC
  Administered 2018-11-23: 18:00:00 via INTRAVENOUS
  Filled 2018-11-23: qty 1992

## 2018-11-23 NOTE — Consult Note (Signed)
[facility] NOTE   Pharmacy Consult for TPN  Indication:  Prolonged ileus   Patient Measurements: Height: 5' 7.5" (171.5 cm) Weight: (patient refused this am) IBW/kg (Calculated) : 62.75 TPN AdjBW (KG): 72.8 Body mass index is 31.66 kg/m. Usual Weight: 103 kg  Assessment: 53 yo female found to have two large holes in sigmoid colon and deserosalization injury to segment of small intestine during scheduled laparoscopic hysterectomy. Patient is S/P exploratory laparotomy, primary repair of small intestine injury, and partial colectomy. Patient now with post-operative ileus expected to be prolonged.   GI: NPO with sips of clears for comfort  Endo:  Hx of DM. A1C: 6.1.  Metformin PTA.  Levemir 15u at bedtime currently inpatient.   Insulin requirements in the past 24 hours: Aspart 4 units  Renal:Scr: 0.70, CrCl: 3ml/min Hepatobil: ID: Abx  Day  #15 9/15 Cefoxitin >>9/20 9/21 Vanc/Zosyn >>9/23 9/23 ampicillin >>  TPN Access: PICC placement 9/17 TPN start date:  11/11/18 Nutritional Goals (per RD recommendation on): KCal: 2180-2350 Protein: 105-120 Fluid: 2.1-2.3L/day  Goal TPN rate is 83 mL/hr  ml/hr  Plan:   Continue Clinimix E 5/20 at goal rate of 83 mL/hr x 24 hrs.    Continue 20% ILE at 20 mL/hr x 12 hrs   monitor electrolytes every Mon/Thursday and as warranted.     Add MVI and trace elements to TPN daily   Continue NS at 25 mL/hr   TGs with Mon labs (140 on 9/28).   F/U Daily   Dallie Piles, PharmD Clinical Pharmacist 11/23/2018 7:42 AM
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
## 2018-11-23 NOTE — Plan of Care (Signed)
  Problem: Activity: Goal: Risk for activity intolerance will decrease Outcome: Progressing  Patient ambulating in halls frequently.

## 2018-11-23 NOTE — Progress Notes (Signed)
[facility] Day(s): [facility] op day(s): 14 Days Post-Op.   Interval History: Patient seen and examined, no acute events or new complaints overnight. Attempted NGT clamping trial yesterday, had emesis associated with medications. NGT was again clamped around 3PM and has remained clamped since. She denied any complaints of nausea or emesis. She continues to pass gas with NGT clamped. No fever or chills. She is still on TPN. Wound vac to midline wound with good seal. JP in LLQ with minimal drainage.     Vital signs in last 24 hours: [min-max] current  Temp:  [97.4 F (36.3 C)-98.3 F (36.8 C)] 97.9 F (36.6 C) (09/29 0513) Pulse Rate:  [86-89] 87 (09/29 0513) Resp:  [20] 20 (09/29 0513) BP: (104-152)/(79-92) 104/92 (09/29 0513) SpO2:  [95 %-97 %] 97 % (09/29 0513)     Height: 5' 7.5" (171.5 cm) Weight: (patient refused this am) BMI (Calculated): 31.65   Intake/Output last 2 shifts:  09/28 0701 - 09/29 0700 In: 2041.3 [I.V.:1474.3; IV Piggyback:567] Out: [id number] [Urine:350; Emesis/NG output:100; Drains:217]   Physical Exam:  Constitutional: alert, cooperative and no distress. HEENT: NGT in place, clamped Respiratory: breathing non-labored at rest  Cardiovascular: regular rate and sinus rhythm  Gastrointestinal: soft,expected incisional soreness, and non-distended. No rebound/guarding. JP in RLQ with serous output, minimal. Integumentary:Midline wound with wound vac in place to lower portion, good seal, serosanguinous output   Labs:  CBC Latest Ref Rng & Units 11/22/2018 11/19/2018 11/15/2018  WBC 4.0 - 10.5 K/uL 6.6 8.4 6.4  Hemoglobin 12.0 - 15.0 g/dL 9.3(L) 9.1(L) 8.6(L)  Hematocrit 36.0 - 46.0 % 29.6(L) 28.7(L) 26.8(L)  Platelets 150 - 400 K/uL 532(H) 513(H) 339   CMP Latest Ref Rng & Units 11/22/2018 11/20/2018 11/19/2018  Glucose 70 - 99 mg/dL 115(H) 107(H) 107(H)  BUN 6 - 20 mg/dL 13 12 10   Creatinine 0.44 - 1.00 mg/dL 0.70  0.64 0.68  Sodium 135 - 145 mmol/L 137 139 140  Potassium 3.5 - 5.1 mmol/L 3.7 3.9 3.8  Chloride 98 - 111 mmol/L 104 104 104  CO2 22 - 32 mmol/L 26 27 26   Calcium 8.9 - 10.3 mg/dL 8.7(L) 8.4(L) 8.3(L)  Total Protein 6.5 - 8.1 g/dL 7.3 - -  Total Bilirubin 0.3 - 1.2 mg/dL 0.5 - -  Alkaline Phos 38 - 126 U/L 55 - -  AST 15 - 41 U/L 33 - -  ALT 0 - 44 U/L 48(H) - -     Imaging studies: No new pertinent imaging studies   Assessment/Plan:  53 y.o. female who clinically appears to be slowly improving with evidence of continued bowel function with NGT clamped x15 hours at this point 14 Days Post-Op s/p exploratory laparotomy, primary repair of small intestine injury, partial colectomy with primary stapled anastomosisfor intra-operative bowel injury.   - Will check residuals this morning; if <150-200 ccs will remove NGT and initiate diet   - Continue TPN; monitor nutritional labs   - Continue IV Abx (Ampicillin)  - Monitor abdominal examination; on-going bowel function  - Monitor JP output; this is slowing down, we may be able to remove this in a few days   - Continue wound vac to midline; appreciate WOC assistance  - mobilization encouraged as tolerates; working with PT; recommending outpatient PT - further management per primary service  All of the above findings and recommendations were discussed with the patient, and the medical team, and all of patient's questions were answered  to her expressed satisfaction.  -- Edison Simon, PA-C [facility] 11/23/2018, 7:30 AM [phone] M-F: 7am - 4pm

## 2018-11-23 NOTE — Progress Notes (Signed)
Patient with wound VAC in place but it has lost seal. This RN spoke to Surgeon, OB/GYN Md as well as WOC Rn. After collaboration we have decided to remove all staples and replace wound VAc dressing. This RN was successful in removing all staples and placing new VAC dressing. Will continue to monitor the site.
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
## 2018-11-24 LAB — GLUCOSE, CAPILLARY
Glucose-Capillary: 112 mg/dL — ABNORMAL HIGH (ref 70–99)
Glucose-Capillary: 119 mg/dL — ABNORMAL HIGH (ref 70–99)
Glucose-Capillary: 135 mg/dL — ABNORMAL HIGH (ref 70–99)
Glucose-Capillary: 137 mg/dL — ABNORMAL HIGH (ref 70–99)

## 2018-11-24 MED ORDER — TRACE MINERALS CR-CU-MN-SE-ZN 10-1000-500-60 MCG/ML IV SOLN
INTRAVENOUS | Status: AC
  Administered 2018-11-24: 18:00:00 via INTRAVENOUS
  Filled 2018-11-24: qty 1992

## 2018-11-24 MED ORDER — ENSURE ENLIVE PO LIQD
237.0000 mL | Freq: Two times a day (BID) | ORAL | Status: DC
  Administered 2018-11-24: 237 mL via ORAL

## 2018-11-24 MED ORDER — FAT EMULSION PLANT BASED 20 % IV EMUL
250.0000 mL | INTRAVENOUS | Status: DC
  Administered 2018-11-24: 18:00:00 250 mL via INTRAVENOUS
  Filled 2018-11-24: qty 250

## 2018-11-24 NOTE — Evaluation (Signed)
Physical Therapy Re-Evaluation Patient Details Name: Catherine Christensen MRN: [id number] DOB: May 27, 1965 Today's Date: 11/24/2018   History of Present Illness  Pt is a 53 year old female admitted s/p hysterectomy.  PMH includes DM, obesity, depression.  Clinical Impression  PT re-evaluation performed d/t extended hospitalization.  Prior to hospital admission, pt was independent.  Currently pt is modified independent with bed mobility; independent with transfers; CGA to SBA with ambulation around nursing loop (no UE support).  Pt had 3 short standing breaks during ambulation d/t abdominal discomfort (pt reporting felt like gas pains) but was motivated to keep walking despite pain.  Attempted to perform LE ex's with pt but pt declined all attempts at ex's (including ankle pumps and quad sets laying in bed) d/t reports she did not trust that any exercise would not cause her increased abdominal pain (pt reporting increased pain with staples removal and wound vac change yesterday and did not walk yesterday d/t this).  Pt would benefit from skilled PT to address noted impairments and functional limitations (see below for any additional details).  Upon hospital discharge, recommend pt discharge with OP PT.  PT POC reviewed and updated as appropriate.    Follow Up Recommendations Outpatient PT    Equipment Recommendations  3in1 (PT)    Recommendations for Other Services       Precautions / Restrictions Precautions Precautions: Fall Precaution Comments: abdominal incision and drain; wound vac Other Brace: abdominal binder to walk Restrictions Weight Bearing Restrictions: No      Mobility  Bed Mobility Overal bed mobility: Modified Independent Bed Mobility: Supine to Sit;Sit to Supine     Supine to sit: Modified independent (Device/Increase time)(x2 trials) Sit to supine: Modified independent (Device/Increase time)   General bed mobility comments: increased effort to perform; via  logrolling  Transfers Overall transfer level: Independent Equipment used: None Transfers: Sit to/from Stand Sit to Stand: Independent         General transfer comment: mild increased effort to stand d/t abdominal discomfort; steady  Ambulation/Gait Ambulation/Gait assistance: Supervision;Min guard Gait Distance (Feet): 670 Feet Assistive device: None   Gait velocity: mild decreased cadence   General Gait Details: mostly step through gait pattern; 3 short standing rest breaks d/t abdominal discomfort  Stairs            Wheelchair Mobility    Modified Rankin (Stroke Patients Only)       Balance Overall balance assessment: Needs assistance Sitting-balance support: No upper extremity supported;Feet supported Sitting balance-Leahy Scale: Normal Sitting balance - Comments: steady sitting reaching outside BOS   Standing balance support: No upper extremity supported;During functional activity Standing balance-Leahy Scale: Good Standing balance comment: steady with ambulation with improved confidence noted in regards to balance                             Pertinent Vitals/Pain Pain Assessment: Faces Faces Pain Scale: Hurts a little bit(6/10 with ambulation; 2/10 at rest end of session) Pain Location: abdominal discomfort (pt describes as gas feeling) Pain Descriptors / Indicators: Guarding;Discomfort;Grimacing Pain Intervention(s): Limited activity within patient's tolerance;Monitored during session;Repositioned  Vitals (HR and O2 on room air) stable and WFL throughout treatment session.    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        Extremity/Trunk Assessment   Upper Extremity Assessment Upper Extremity Assessment: Generalized weakness    Lower Extremity Assessment Lower Extremity Assessment: Generalized weakness    Cervical / Trunk Assessment Cervical / Trunk Assessment: Normal  Communication   Communication: No difficulties  Cognition Arousal/Alertness: Awake/alert Behavior During Therapy: WFL for tasks assessed/performed Overall Cognitive Status: Within Functional Limits for tasks assessed                                        General Comments General comments (skin integrity, edema, etc.): no drainage noted R lower abdominal drain dressing and abdominal incision dressing end of session; wound vac intact.  Nursing cleared pt for participation in physical therapy.  Pt agreeable to PT session.    Exercises     Assessment/Plan    PT Assessment Patient needs continued PT services  PT Problem List Decreased strength;Decreased activity tolerance;Decreased balance;Decreased mobility;Pain;Decreased skin integrity       PT Treatment Interventions Gait training;Stair training;Functional mobility training;Therapeutic activities;Therapeutic exercise;Balance training;Patient/family education    PT Goals (Current goals can be found in the Care Plan section)  Acute Rehab PT Goals Patient Stated Goal: To return to daily activity without pain. PT Goal Formulation: With patient Time For Goal Achievement: 12/08/18 Potential to Achieve Goals: Good    Frequency Min 2X/week   Barriers to discharge        Co-evaluation               AM-PAC PT "6 Clicks" Mobility  Outcome Measure Help needed turning from your back to your side while in a flat bed without using bedrails?: None Help needed moving from lying on your back to sitting on the side of a flat bed without using bedrails?: None Help  needed moving to and from a bed to a chair (including a wheelchair)?: None Help needed standing up from a chair using your arms (e.g., wheelchair or bedside chair)?: None Help needed to walk in hospital room?: None Help needed climbing 3-5 steps with a railing? : A Little 6 Click Score: 23    End of Session Equipment Utilized During Treatment: Other (comment)(abdominal binder donned beginning of session for ambulation and doffed end of session) Activity Tolerance: Patient limited by pain Patient left: in bed;with call bell/phone within reach Nurse Communication: Mobility status;Precautions PT Visit Diagnosis: Muscle weakness (generalized) (M62.81);Pain;Other abnormalities of gait and mobility (R26.89)    Time: [id number] PT Time Calculation (min) (ACUTE ONLY): 23 min   Charges:   PT Evaluation $PT Re-evaluation: 1 Re-eval PT Treatments $Therapeutic Exercise: 8-22 mins       Leitha Bleak, PT 11/24/18, 10:31 AM [phone]
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
## 2018-11-24 NOTE — Progress Notes (Signed)
[place] Hospital Day(s): 15.   Post op day(s): 15 Days Post-Op.   Interval History: Patient seen and examined, no acute events or new complaints overnight. Patient reports that she is feeling good. Her abdominal pain continues to improve each day., No fever, chills, nausea, or emesis. NGT was removed yesterday and she was started on CLD. She has tolerated this well but is not eating all of it because she is nervous. Still with flatus and BMs. Continues to mobilize well. JP with minimal output. Wound vac changed and superior staples removed yesterday.   Vital signs in last 24 hours: [min-max] current  Temp:  [98.5 F (36.9 C)-98.9 F (37.2 C)] 98.5 F (36.9 C) (09/30 0452) Pulse Rate:  [81-88] 88 (09/30 0452) Resp:  [20] 20 (09/30 0452) BP: (123-140)/(78-85) 130/78 (09/30 0452) SpO2:  [97 %-100 %] 97 % (09/30 0452)     Height: 5' 7.5" (171.5 cm) Weight: (patient refused this am) BMI (Calculated): 31.65   Intake/Output last 2 shifts:  09/29 0701 - 09/30 0700 In: 3809 [P.O.:180; I.V.:3004; NG/GT:25; IV Piggyback:600] Out: 385 [Urine:350; Emesis/NG output:25; Drains:10]   Physical Exam:  Constitutional: alert, cooperative and no distress. Respiratory: breathing non-labored at rest  Cardiovascular: regular rate and sinus rhythm  Gastrointestinal: soft,expected incisional soreness, and non-distended. No rebound/guarding. JP in RLQ with serous output, minimal. Integumentary:Midline wound with wound vac in place to lower portion, good seal, serosanguinous output. Staples removed to superior portion yesterday, this is intact without erythema   Labs:  CBC Latest Ref Rng & Units 11/22/2018 11/19/2018 11/15/2018  WBC 4.0 - 10.5 K/uL 6.6 8.4 6.4  Hemoglobin 12.0 - 15.0 g/dL 9.3(L) 9.1(L) 8.6(L)  Hematocrit 36.0 - 46.0 % 29.6(L) 28.7(L) 26.8(L)  Platelets 150 - 400 K/uL 532(H) 513(H) 339   CMP Latest Ref Rng & Units 11/23/2018 11/22/2018 11/20/2018   Glucose 70 - 99 mg/dL - 115(H) 107(H)  BUN 6 - 20 mg/dL - 13 12  Creatinine 0.44 - 1.00 mg/dL 0.68 0.70 0.64  Sodium 135 - 145 mmol/L - 137 139  Potassium 3.5 - 5.1 mmol/L - 3.7 3.9  Chloride 98 - 111 mmol/L - 104 104  CO2 22 - 32 mmol/L - 26 27  Calcium 8.9 - 10.3 mg/dL - 8.7(L) 8.4(L)  Total Protein 6.5 - 8.1 g/dL - 7.3 -  Total Bilirubin 0.3 - 1.2 mg/dL - 0.5 -  Alkaline Phos 38 - 126 U/L - 55 -  AST 15 - 41 U/L - 33 -  ALT 0 - 44 U/L - 48(H) -     Imaging studies: No new pertinent imaging studies   Assessment/Plan:  53 y.o. female overall doing much better and slowly progressing 15 Days Post-Op s/p exploratory laparotomy, primary repair of small intestine injury, partial colectomy with primary stapled anastomosisfor intra-operative bowel injury.   - Advance to full liquid diet + Wean TPN once she toelrates full liquid diet  - monitor nutritional labs              - Continue IV Abx (Ampicillin)             - Monitor abdominal examination; on-going bowel function  - Continue JP for now; minimal output; once fully advances diet we will remove this  - Continue wound vac to midline; appreciate WOC assistance             - mobilization encouraged as tolerates; working with PT; recommending outpatient PT - further management per primary  service   All of the above findings and recommendations were discussed with the patient, and the medical team, and all of patient's questions were answered to her expressed satisfaction.  -- Edison Simon, PA-C [facility] 11/24/2018, 8:00 AM [phone] M-F: 7am - 4pm

## 2018-11-24 NOTE — Consult Note (Signed)
[place] NOTE   Pharmacy Consult for TPN  Indication:  Prolonged ileus   Patient Measurements: Height: 5' 7.5" (171.5 cm) Weight: (patient refused this am) IBW/kg (Calculated) : 62.75 TPN AdjBW (KG): 72.8 Body mass index is 31.66 kg/m. Usual Weight: 103 kg  Assessment: 53 yo female found to have two large holes in sigmoid colon and deserosalization injury to segment of small intestine during scheduled laparoscopic hysterectomy. Patient is S/P exploratory laparotomy, primary repair of small intestine injury, and partial colectomy.  Patient's NG tube removed yesterday and started on clears.  She's tolerating without any nausea or worsening pain.   GI: NPO with sips of clears for comfort  Endo:  Hx of DM. A1C: 6.1.  Metformin PTA.  Levemir 15u at bedtime currently inpatient.   Insulin requirements in the past 24 hours: Aspart 3 units  Renal:Scr: 0.70, CrCl: 40ml/min Hepatobil: ID: Abx  Day  #16 9/15 Cefoxitin >>9/20 9/21 Vanc/Zosyn >>9/23 9/23 ampicillin >>  TPN Access: PICC placement 9/17 TPN start date:  11/11/18 Nutritional Goals (per RD recommendation on): KCal: 2180-2350 Protein: 105-120 Fluid: 2.1-2.3L/day  Goal TPN rate is 83 mL/hr  ml/hr  Plan:   The patient was advanced to full liquid diet this morning with the intention to wean the TPN if she tolerated the diet. However, she was only able to consume a small amount   Continue Clinimix E 5/20 at goal rate of 83 mL/hr x 24 hrs.    Continue 20% ILE at 20 mL/hr x 12 hrs   monitor electrolytes every Mon/Thursday and as warranted.     Add MVI and trace elements to TPN daily   Continue NS at 25 mL/hr   TGs with Mon labs (140 on 9/28).   F/U Daily   Dallie Piles, PharmD Clinical Pharmacist 11/24/2018 7:01 AM

## 2018-11-24 NOTE — Consult Note (Signed)
[place] Nurse wound follow up Patient receiving care in [facility]. Wound type: Abdominal surgical I spoke with the patient's primary RN, Beth.  I explained that since the [facility] dressing was changed yesterday, I will change it again this Friday.  She relayed that it has a great seal and she will inform the patient of the next dressing change. Val Riles, RN, MSN, CWOCN, CNS-BC, pager [phone]

## 2018-11-25 LAB — COMPREHENSIVE METABOLIC PANEL
ALT: 46 U/L — ABNORMAL HIGH (ref 0–44)
AST: 29 U/L (ref 15–41)
Albumin: 2.9 g/dL — ABNORMAL LOW (ref 3.5–5.0)
Alkaline Phosphatase: 52 U/L (ref 38–126)
Anion gap: 8 (ref 5–15)
BUN: 13 mg/dL (ref 6–20)
CO2: 26 mmol/L (ref 22–32)
Calcium: 8.5 mg/dL — ABNORMAL LOW (ref 8.9–10.3)
Chloride: 104 mmol/L (ref 98–111)
Creatinine, Ser: 0.68 mg/dL (ref 0.44–1.00)
GFR calc Af Amer: >60 mL/min (ref >60)
GFR calc non Af Amer: >60 mL/min (ref >60)
Glucose, Bld: 109 mg/dL — ABNORMAL HIGH (ref 70–99)
Potassium: 3.9 mmol/L (ref 3.5–5.1)
Sodium: 138 mmol/L (ref 135–145)
Total Bilirubin: 0.5 mg/dL (ref 0.3–1.2)
Total Protein: 7 g/dL (ref 6.5–8.1)

## 2018-11-25 LAB — GLUCOSE, CAPILLARY
Glucose-Capillary: 113 mg/dL — ABNORMAL HIGH (ref 70–99)
Glucose-Capillary: 119 mg/dL — ABNORMAL HIGH (ref 70–99)
Glucose-Capillary: 125 mg/dL — ABNORMAL HIGH (ref 70–99)
Glucose-Capillary: 130 mg/dL — ABNORMAL HIGH (ref 70–99)
Glucose-Capillary: 134 mg/dL — ABNORMAL HIGH (ref 70–99)

## 2018-11-25 LAB — MAGNESIUM: Magnesium: 1.9 mg/dL (ref 1.7–2.4)

## 2018-11-25 LAB — PHOSPHORUS: Phosphorus: 4.7 mg/dL — ABNORMAL HIGH (ref 2.5–4.6)

## 2018-11-25 MED ORDER — FAT EMULSION PLANT BASED 20 % IV EMUL
250.0000 mL | INTRAVENOUS | Status: AC
  Administered 2018-11-25: 250 mL via INTRAVENOUS
  Filled 2018-11-25: qty 250

## 2018-11-25 MED ORDER — TRACE MINERALS CR-CU-MN-SE-ZN 10-1000-500-60 MCG/ML IV SOLN
INTRAVENOUS | Status: AC
  Administered 2018-11-25: 18:00:00 via INTRAVENOUS
  Filled 2018-11-25: qty 960

## 2018-11-25 NOTE — Progress Notes (Addendum)
[place] Hospital Day(s): 16.   Post op day(s): 16 Days Post-Op.   Interval History: Patient seen and examined, no acute events or new complaints overnight. Patient reports she continues to gradually feel better. She notes abdominal soreness but improved. No fever, chills, nausea or emesis. She did have full liquid diet yesterday but notes it "did not taste good." She continues to have flatus. Mobilizing well. No issues with wound vac. JP with minimal output.    Vital signs in last 24 hours: [min-max] current  Temp:  [98.4 F (36.9 C)] 98.4 F (36.9 C) (10/01 0525) Pulse Rate:  [81-89] 81 (10/01 0525) Resp:  [18-20] 20 (10/01 0525) BP: (135-146)/(81-87) 135/82 (10/01 0525) SpO2:  [96 %-97 %] 97 % (10/01 0525)     Height: 5' 7.5" (171.5 cm) Weight: (patient refused this am) BMI (Calculated): 31.65   Intake/Output last 2 shifts:  09/30 0701 - 10/01 0700 In: 3318.5 [P.O.:240; I.V.:2678.5; IV Piggyback:400] Out: -    Physical Exam:  Constitutional: alert, cooperative and no distress. Respiratory: breathing non-labored at rest  Cardiovascular: regular rate and sinus rhythm  Gastrointestinal: soft,expected incisional soreness, and non-distended. No rebound/guarding. JP in RLQ with serous output, minimal. Integumentary:Midline wound with wound vac in place to lower portion, good seal, serosanguinous output. Staples removed to superior portion yesterday, this is intact without erythema    Labs:  CBC Latest Ref Rng & Units 11/22/2018 11/19/2018 11/15/2018  WBC 4.0 - 10.5 K/uL 6.6 8.4 6.4  Hemoglobin 12.0 - 15.0 g/dL 9.3(L) 9.1(L) 8.6(L)  Hematocrit 36.0 - 46.0 % 29.6(L) 28.7(L) 26.8(L)  Platelets 150 - 400 K/uL 532(H) 513(H) 339   CMP Latest Ref Rng & Units 11/25/2018 11/23/2018 11/22/2018  Glucose 70 - 99 mg/dL 109(H) - 115(H)  BUN 6 - 20 mg/dL 13 - 13  Creatinine 0.44 - 1.00 mg/dL 0.68 0.68 0.70  Sodium 135 - 145 mmol/L 138 - 137   Potassium 3.5 - 5.1 mmol/L 3.9 - 3.7  Chloride 98 - 111 mmol/L 104 - 104  CO2 22 - 32 mmol/L 26 - 26  Calcium 8.9 - 10.3 mg/dL 8.5(L) - 8.7(L)  Total Protein 6.5 - 8.1 g/dL 7.0 - 7.3  Total Bilirubin 0.3 - 1.2 mg/dL 0.5 - 0.5  Alkaline Phos 38 - 126 U/L 52 - 55  AST 15 - 41 U/L 29 - 33  ALT 0 - 44 U/L 46(H) - 48(H)     Imaging studies: No new pertinent imaging studies   Assessment/Plan:  53 y.o. female who continues to progress 16 Days Post-Op s/p exploratory laparotomy, primary repair of small intestine injury, partial colectomy with primary stapled anastomosisfor intra-operative bowel injury.   - Will advance to soft diet this morning in attempts to increase PO intake  - We can begin to wean TPN this today; hopefull stop in next 24-48 if diet advancement is toelrated   - monitor nutritional labs   - Continue IV Abx (Ampicillin) --> Day 8 (complete 10 days total) - Monitor abdominal examination; on-going bowel function             - Continue JP for now; minimal output; hopefully remove tomorrow             - Continue wound vac to midline; appreciate WOC assistance - mobilization encouraged as tolerates; working with PT; recommending outpatient PT - further management per primary service   All of the above findings and recommendations were discussed with the patient, and the  medical team, and all of patient's questions were answered to her expressed satisfaction.  -- Edison Simon, PA-C [facility] 11/25/2018, 8:34 AM [phone] M-F: 7am - 4pm
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
## 2018-11-26 LAB — GLUCOSE, CAPILLARY
Glucose-Capillary: 109 mg/dL — ABNORMAL HIGH (ref 70–99)
Glucose-Capillary: 91 mg/dL (ref 70–99)
Glucose-Capillary: 91 mg/dL (ref 70–99)

## 2018-11-26 LAB — SURGICAL PATHOLOGY

## 2018-11-26 MED ORDER — BOOST / RESOURCE BREEZE PO LIQD CUSTOM
1.0000 | Freq: Three times a day (TID) | ORAL | Status: DC
  Administered 2018-11-26 – 2018-11-27 (×3): 1 via ORAL

## 2018-11-26 NOTE — Progress Notes (Signed)
PT Cancellation Note  Patient Details Name: Catherine Christensen MRN: [id number] DOB: 1965-12-22   Cancelled Treatment:    Reason Eval/Treat Not Completed: Patient declined, no reason specified Attempted to see pt this afternoon, she reports that she did some walking earlier today and has been feeling good.  Does not wish to do more activity at this time with PT, pleasantly refuses.  Spoke with nursing who confirms that pt did walk 4 laps around the nurses station earlier as well as took a shower.  Kreg Shropshire, DPT 11/26/2018, 4:36 PM

## 2018-11-26 NOTE — Progress Notes (Addendum)
[facility] Day(s): Central op day(s): 17 Days Post-Op.   Interval History: Patient seen and examined, no acute events or new complaints overnight. Patient reports she is feeling better this morning. She has improved abdominal soreness. No fever, chills, nausea, or emesis. She was able to tolerate soft diet yesterday and was able to take her time and eat more. She continues to endorse flatus and had 3 BMs yesterday. TPN started to wean off yesterday. JP with minimal output. Mobilizing well. No other acute issues.    Vital signs in last 24 hours: [min-max] current  Temp:  [98.2 F (36.8 C)-98.6 F (37 C)] 98.2 F (36.8 C) (10/02 0558) Pulse Rate:  [81] 81 (10/02 0558) Resp:  [18] 18 (10/02 0558) BP: (124-135)/(73-84) 124/73 (10/02 0558) SpO2:  [97 %-98 %] 98 % (10/02 0558) Weight:  [102.9 kg] 102.9 kg (10/01 1351)     Height: 5' 7.5" (171.5 cm) Weight: (pt refused) BMI (Calculated): 34.98   Intake/Output last 2 shifts:  10/01 0701 - 10/02 0700 In: 2364 [I.V.:2064; IV Piggyback:300] Out: -    Physical Exam:  Constitutional: alert, cooperative and no distress. Respiratory: breathing non-labored at rest  Cardiovascular: regular rate and sinus rhythm  Gastrointestinal: soft,expected incisional soreness, and non-distended. No rebound/guarding. JP in RLQ with serous output, minimal. Integumentary:Midline wound with wound vac in place to lower portion, good seal, serosanguinous output. Staples removed to superior portion yesterday, there is a small 2 cm area in the medial portion of the incision where the skin edge has opened up. No erythema, no drainage.   Labs:  CBC Latest Ref Rng & Units 11/22/2018 11/19/2018 11/15/2018  WBC 4.0 - 10.5 K/uL 6.6 8.4 6.4  Hemoglobin 12.0 - 15.0 g/dL 9.3(L) 9.1(L) 8.6(L)  Hematocrit 36.0 - 46.0 % 29.6(L) 28.7(L) 26.8(L)  Platelets 150 - 400 K/uL 532(H) 513(H) 339   CMP Latest Ref Rng & Units  11/25/2018 11/23/2018 11/22/2018  Glucose 70 - 99 mg/dL 109(H) - 115(H)  BUN 6 - 20 mg/dL 13 - 13  Creatinine 0.44 - 1.00 mg/dL 0.68 0.68 0.70  Sodium 135 - 145 mmol/L 138 - 137  Potassium 3.5 - 5.1 mmol/L 3.9 - 3.7  Chloride 98 - 111 mmol/L 104 - 104  CO2 22 - 32 mmol/L 26 - 26  Calcium 8.9 - 10.3 mg/dL 8.5(L) - 8.7(L)  Total Protein 6.5 - 8.1 g/dL 7.0 - 7.3  Total Bilirubin 0.3 - 1.2 mg/dL 0.5 - 0.5  Alkaline Phos 38 - 126 U/L 52 - 55  AST 15 - 41 U/L 29 - 33  ALT 0 - 44 U/L 46(H) - 48(H)     Imaging studies: No new pertinent imaging studies   Assessment/Plan:  53 y.o. female doing very well with return of significant bowel function 17 Days Post-Op s/p exploratory laparotomy, primary repair of small intestine injury, partial colectomy with primary stapled anastomosisfor intra-operative bowel injury.   - Continue soft diet + Added Boost Breeze for nutritional supplementation             - Continue to wean and discontinue TPN today if possible; appreciate pharmacy and dietitian help             - monitor nutritional labs              - Continue IV Abx (Ampicillin) --> Day 9 (complete 10 days total) - Monitor abdominal examination; on-going bowel function - Remove JP today - Continue  wound vac to midline; appreciate WOC assistance - mobilization encouraged as tolerates; working with PT; recommending outpatient PT - further management per primary service; will need home health services for wound vac management +/- PT if indicated   - General surgery will sign off   - Discharge planning: patient progressing well, hopefully home in next 24-48 hours; will need surgery follow up in 1-2 weeks  All of the above findings and recommendations were discussed with the patient, and the medical team, and all of patient's questions were answered to her expressed satisfaction.  -- Edison Simon, PA-C [facility] 11/26/2018, 7:14 AM [phone] M-F: 7am - 4pm
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
## 2018-11-26 NOTE — Consult Note (Signed)
[facility] Nurse wound consult note Patient receiving care in [facility] Reason for Consult: VAC dressing change to abdomen Wound type: surgical Measurement: 2.8 cm x 1.4 cm x 1.2 cm Wound bed: 100% pink granulation tissue Drainage (amount, consistency, odor) none Periwound: intact, staples out Dressing procedure/placement/frequency: one piece of black foam removed, one piece placed. Immediate seal obtained.   At this rate of filling in and wound healing, I would be very surprised if she needs VAC therapy much beyond the beginning of next week. Val Riles, RN, MSN, CWOCN, CNS-BC, pager [phone]

## 2018-11-27 LAB — GLUCOSE, CAPILLARY
Glucose-Capillary: 109 mg/dL — ABNORMAL HIGH (ref 70–99)
Glucose-Capillary: 93 mg/dL (ref 70–99)
Glucose-Capillary: 99 mg/dL (ref 70–99)

## 2018-11-27 MED ORDER — CEPHALEXIN 500 MG PO CAPS: 500.0000 mg | ORAL_CAPSULE | Freq: Four times a day (QID) | ORAL | 2 refills | Status: DC

## 2018-11-27 MED ORDER — CONTOUR NEXT TEST VI STRP: ORAL_STRIP | 12 refills | Status: DC

## 2018-11-27 MED ORDER — ALPRAZOLAM 0.5 MG PO TABS: 0.5000 mg | ORAL_TABLET | Freq: Three times a day (TID) | ORAL | 5 refills | Status: DC | PRN

## 2018-11-27 MED ORDER — GLUCOSE BLOOD VI STRP: ORAL_STRIP | 12 refills | Status: DC

## 2018-11-27 MED ORDER — METFORMIN HCL ER 500 MG PO TB24: 1000.0000 mg | ORAL_TABLET | Freq: Every day | ORAL | 3 refills | Status: DC

## 2018-11-27 MED ORDER — ZOLPIDEM TARTRATE 5 MG PO TABS: 5.0000 mg | ORAL_TABLET | Freq: Every evening | ORAL | 5 refills | Status: DC | PRN

## 2018-11-27 MED ORDER — SIMETHICONE 80 MG PO CHEW: 80.0000 mg | CHEWABLE_TABLET | Freq: Four times a day (QID) | ORAL | 3 refills | Status: DC | PRN

## 2018-11-27 MED ORDER — OXYCODONE HCL 5 MG PO TABS: 5.0000 mg | ORAL_TABLET | Freq: Four times a day (QID) | ORAL | 0 refills | Status: DC | PRN

## 2018-11-27 MED ORDER — CONTOUR NEXT USB MONITOR W/DEVICE KIT: 1.0000 | PACK | Freq: Four times a day (QID) | 11 refills | Status: DC

## 2018-11-27 MED ORDER — CONTOUR NEXT MONITOR W/DEVICE KIT: 1.0000 | PACK | Freq: Four times a day (QID) | 11 refills | Status: DC

## 2018-11-27 NOTE — Progress Notes (Signed)
Catherine Christensen to be D/C'd home per MD order.  Discussed prescriptions and follow up appointments with the patient. Prescriptions given to patient, medication list explained in detail. Pt verbalized understanding.  Allergies as of 11/27/2018      Reactions   Dilaudid [hydromorphone Hcl] Nausea And Vomiting   Feraheme [ferumoxytol] Shortness Of Breath, Other (See Comments)   Patient stated that she experienced back pain, heat all over her body, full body spasms, and increased blood pressure. Shortly thereafter she had an extreme headache.      Medication List    STOP taking these medications   norethindrone 5 MG tablet Commonly known as: AYGESTIN   traMADol 50 MG tablet Commonly known as: Ultram     TAKE these medications   ALPRAZolam 0.5 MG tablet Commonly known as: XANAX Take 1 tablet (0.5 mg total) by mouth 3 (three) times daily as needed for anxiety or sleep.   amLODipine 10 MG tablet Commonly known as: NORVASC Take 1 tablet (10 mg total) by mouth daily.   cephALEXin 500 MG capsule Commonly known as: KEFLEX Take 1 capsule (500 mg total) by mouth 4 (four) times daily.   Contour Next USB Monitor w/Device Kit 1 each by Does not apply route 4 (four) times daily.   Contour Next Monitor w/Device Kit 1 each by Does not apply route 4 (four) times daily.   glucose blood test strip Use as instructed   Contour Next Test test strip Generic drug: glucose blood Use as instructed   Iron 27 240 (27 FE) MG tablet Generic drug: ferrous gluconate Take 27 mg by mouth daily.   metFORMIN 500 MG 24 hr tablet Commonly known as: Glucophage XR Take 2 tablets (1,000 mg total) by mouth daily with breakfast.   multivitamin with minerals Tabs tablet Take 1 tablet by mouth daily.   naproxen 500 MG tablet Commonly known as: NAPROSYN Take 1 tablet (500 mg total) by mouth 2 (two) times daily with a meal. As needed for pain   oxyCODONE 5 MG immediate release tablet Commonly known as: Oxy  IR/ROXICODONE Take 1 tablet (5 mg total) by mouth every 6 (six) hours as needed for up to 7 days for severe pain.   simethicone 80 MG chewable tablet Commonly known as: MYLICON Chew 1 tablet (80 mg total) by mouth 4 (four) times daily as needed for flatulence (gas pain).   zolpidem 5 MG tablet Commonly known as: AMBIEN Take 1 tablet (5 mg total) by mouth at bedtime as needed for sleep.            Durable Medical Equipment  (From admission, onward)         Start     Ordered   11/27/18 1029  For home use only DME Negative pressure wound device  Once    Question Answer Comment  Frequency of dressing change 3 times per week   Length of need Lifetime   Dressing type Foam   Amount of suction 125 mm/Hg   Pressure application Continuous pressure   Supplies 10 canisters and 15 dressings per month for duration of therapy      11/27/18 1028   11/12/18 1430  For home use only DME Bedside commode  Once    Question:  Patient needs a bedside commode to treat with the following condition  Answer:  S/P hysterectomy   11/12/18 1429   11/12/18 1429  For home use only DME Walker rolling  Once    Question:  Patient  needs a walker to treat with the following condition  Answer:  S/P hysterectomy   11/12/18 1429          Vitals:   11/26/18 2121 11/27/18 0605  BP: 124/84 131/84  Pulse: 89 93  Resp: 16 18  Temp: 97.6 F (36.4 C) 97.9 F (36.6 C)  SpO2: 97% 97%    Skin clean, dry and intact without evidence of skin break down, no evidence of skin tears noted. IV catheter discontinued intact. Site without signs and symptoms of complications. Dressing and pressure applied. Pt denies pain at this time. No complaints noted.  An After Visit Summary was printed and given to the patient. Patient escorted via [place], and D/C home via private auto.  Chuck Hint RN [facility] [employer]

## 2018-11-27 NOTE — Discharge Summary (Signed)
Physician Discharge Summary   Patient ID: Catherine Christensen [id number] 53 y.o. February 26, 1965  Admit date: 11/09/2018  Discharge date and time: 11/27/2018  4:12 PM   Admitting Physician: Homero Fellers, MD   Discharge Physician: Adrian Prows  Admission Diagnoses: Fibroid uterus Menorrhagia  Discharge Diagnoses: S/p hysterectomy, postoperative ileus, wound infection  Admission Condition: good  Discharged Condition: good  Indication for Admission: Postoperative recovery  Hospital Course: Gradual recovery, was able to walk, tolerate and oral diet and have pain controlled with oral pain medicaiton before discharge home.   Consults: general surgery  Significant Diagnostic Studies: see epic  Treatments: surgery  Discharge Exam: BP 131/84 (BP Location: Left Arm)   Pulse 93   Temp 97.9 F (36.6 C) (Oral)   Resp 18   Ht 5' 7.5" (1.715 m)   Wt 102.3 kg   SpO2 97%   BMI 34.80 kg/m   General Appearance:    Alert, cooperative, no distress, appears stated age  Head:    Normocephalic, without obvious abnormality, atraumatic  Eyes:    PERRL, conjunctiva/corneas clear, EOM's intact, fundi    benign, both eyes  Ears:    Normal TM's and external ear canals, both ears  Nose:   Nares normal, septum midline, mucosa normal, no drainage    or sinus tenderness  Throat:   Lips, mucosa, and tongue normal; teeth and gums normal  Neck:   Supple, symmetrical, trachea midline, no adenopathy;    thyroid:  no enlargement/tenderness/nodules; no carotid   bruit or JVD  Back:     Symmetric, no curvature, ROM normal, no CVA tenderness  Lungs:     Clear to auscultation bilaterally, respirations unlabored  Chest Wall:    No tenderness or deformity   Heart:    Regular rate and rhythm, S1 and S2 normal, no murmur, rub   or gallop  Breast Exam:    No tenderness, masses, or nipple abnormality  Abdomen:     Soft, non-tender, bowel sounds active all four quadrants,    no masses, no organomegaly   Genitalia:    Normal female without lesion, discharge or tenderness  Rectal:    Normal tone, normal prostate, no masses or tenderness;   guaiac negative stool  Extremities:   Extremities normal, atraumatic, no cyanosis or edema  Pulses:   2+ and symmetric all extremities  Skin:   Skin color, texture, turgor normal, no rashes or lesions  Lymph nodes:   Cervical, supraclavicular, and axillary nodes normal  Neurologic:   CNII-XII intact, normal strength, sensation and reflexes    throughout    Disposition:   Patient Instructions:  Allergies as of 11/27/2018      Reactions   Dilaudid [hydromorphone Hcl] Nausea And Vomiting   Feraheme [ferumoxytol] Shortness Of Breath, Other (See Comments)   Patient stated that she experienced back pain, heat all over her body, full body spasms, and increased blood pressure. Shortly thereafter she had an extreme headache.      Medication List    STOP taking these medications   norethindrone 5 MG tablet Commonly known as: AYGESTIN   traMADol 50 MG tablet Commonly known as: Ultram     TAKE these medications   ALPRAZolam 0.5 MG tablet Commonly known as: XANAX Take 1 tablet (0.5 mg total) by mouth 3 (three) times daily as needed for anxiety or sleep.   amLODipine 10 MG tablet Commonly known as: NORVASC Take 1 tablet (10 mg total) by mouth daily.   cephALEXin 500  MG capsule Commonly known as: KEFLEX Take 1 capsule (500 mg total) by mouth 4 (four) times daily.   Contour Next USB Monitor w/Device Kit 1 each by Does not apply route 4 (four) times daily.   Contour Next Monitor w/Device Kit 1 each by Does not apply route 4 (four) times daily.   glucose blood test strip Use as instructed   Contour Next Test test strip Generic drug: glucose blood Use as instructed   metFORMIN 500 MG 24 hr tablet Commonly known as: Glucophage XR Take 2 tablets (1,000 mg total) by mouth daily with breakfast.   multivitamin with minerals Tabs tablet Take 1  tablet by mouth daily.   naproxen 500 MG tablet Commonly known as: NAPROSYN Take 1 tablet (500 mg total) by mouth 2 (two) times daily with a meal. As needed for pain   simethicone 80 MG chewable tablet Commonly known as: MYLICON Chew 1 tablet (80 mg total) by mouth 4 (four) times daily as needed for flatulence (gas pain).   zolpidem 5 MG tablet Commonly known as: AMBIEN Take 1 tablet (5 mg total) by mouth at bedtime as needed for sleep.      Activity: activity as tolerated Diet: soft diet Wound Care: as directed  Follow-up with Dr. Gilman Schmidt in 3 days.  Signed: Homero Fellers 12/08/2018 10:39 PM
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
## 2018-11-27 NOTE — Discharge Instructions (Signed)
Take Blood Sugars fasting hen you wake up and before you eat.  Please notify me if your fasting values are above 120 or if before eating the value is above 150.  Please walk daily.  Start with short distances/times such as 5 minutes and build up gradually over 2-3 weeks to 15-20 minute walks as tolerated. Rest if you feel fatigued or dizzy.  You should walk 4-5 times a day.  You may shower if you cover your wound vac with a towel and wrap with plastic wrap the wound. The wound vac should not get wet and should not loose suction. Do not disconnect the wound vac for more than 1 hour. Clamp the vac before disconnecting.    Negative Pressure Wound Therapy Home Guide Negative pressure wound therapy (NPWT) uses a sponge or foam-like material (dressing) placed on or inside the wound. The wound is then covered and sealed with a cover dressing that sticks to your skin (is adhesive). This keeps air out. A tube is attached to the cover dressing, and this tube connects to a small pump. The pump sucks fluid and germs from the wound. NPWT helps to increase blood flow to the wound and heal it from the inside. What are the risks? NPWT is usually safe to use. However, problems can occur, including:  Skin irritation from the dressing adhesive.  Bleeding.  Infection.  Dehydration. Wounds with large amounts of drainage can cause excessive fluid loss.  Pain. Supplies needed:  A disposable garbage bag.  Soap and water, or hand sanitizer.  Wound cleanser or salt-water solution (saline).  New sponge and cover dressing.  Protective clothing.  Gauze pad.  Vinyl gloves.  Tape.  Skin protectant. This may be a wipe, film, or spray.  Clean or germ-free (sterile) scissors.  Eye protection. How to change your dressing Prepare to change your dressing  1. If told by your health care provider, take pain medicine 30 minutes before changing the dressing. 2. Wash your hands with soap and water.  Dry your hands with a clean towel. If soap and water are not available, use hand sanitizer. 3. Set up a clean station for wound care. 4. Open the dressing package so that the sponge dressing remains on the inside of the package. 5. Wear gloves, protective clothing, and eye protection. Remove old dressing  1. Turn off the pump and disconnect the tubing from the dressing. 2. Carefully remove the adhesive cover dressing in the direction of your hair growth. 3. Remove the sponge dressing that is inside the wound. If the sponge sticks, use a wound cleanser or saline solution to wet the sponge and help it come off more easily. 4. Throw the old sponge and cover dressing supplies into the garbage bag. 5. Remove your gloves by grabbing the cuff and turning the glove inside out. Place the gloves in the trash immediately. 6. Wash your hands with soap and water. Dry your hands with a clean towel. If soap and water are not available, use hand sanitizer. Clean your wound  Wear gloves, protective clothing, and eye protection. Follow your health care provider's instructions on how to clean your wound. You may be told to: 1. Clean the wound using a saline solution or a wound cleanser and a clean gauze pad. 2. Pat the wound dry with a gauze pad. Do not rub the wound. 3. Throw the gauze pad into the garbage bag. 4. Remove your gloves by grabbing the cuff and turning the glove  inside out. Place the gloves in the trash immediately. 5. Wash your hands with soap and water. Dry your hands with a clean towel. If soap and water are not available, use hand sanitizer. Apply new dressing  Wear gloves, protective clothing, and eye protection. 1. If told by your health care provider, apply a skin protectant to any skin that will be exposed to adhesive. Let the skin protectant dry. 2. Cut a piece of new sponge dressing and put it on or in the wound. 3. Using clean scissors, cut a nickel-sized hole in the new cover  dressing. 4. Apply the cover dressing. 5. Attach the suction tube over the hole in the cover dressing. 6. Take off your gloves. Put them in the plastic bag with the old dressing. Tie the bag shut and throw it away. 7. Wash your hands with soap and water. Dry your hands with a clean towel. If soap and water are not available, use hand sanitizer. 8. Turn the pump back on. The sponge dressing should collapse. Do not change the settings on the machine without talking to a health care provider. 9. Replace the container in the pump that collects fluid if it is full. Replace the container per the manufacturer's instructions or at least once a week, even if it is not full. General tips and recommendations If the alarm sounds:  Stay calm.  Do not turn off the pump or do anything with the dressing.  Reasons the alarm may go off: ? The battery is low. Change the battery or plug the device into electrical power. ? The dressing has a leak. Find the leak and put tape over the leak. ? The fluid collection container is full. Change the fluid container.  Call your health care provider right away if you cannot fix the problem.  Explain to your health care provider what is happening. Follow his or her instructions. General instructions  Do not turn off the pump unless told to do so by your health care provider.  Do not turn off the pump for more than 2 hours. If the pump is off for more than 2 hours, the dressing will need to be changed.  If your health care provider says it is okay to shower: ? Do not take the pump into the shower. ? Make sure the wound dressing is protected and sealed. The wound dressing must stay dry.  Check frequently that the machine indicates that therapy is on and that all clamps are open.  Do not use over-the-counter medicated or antiseptic creams, sprays, liquids, or dressings unless your health care provider approves. Contact a health care provider if:  You have new  pain.  You develop irritation, a rash, or itching around the wound or dressing.  You see new black or yellow tissue in your wound.  The dressing changes are painful or cause bleeding.  The pump has been off for more than 2 hours, and you do not know how to change the dressing.  The pump alarm goes off, and you do not know what to do. Get help right away if:  You have a lot of bleeding.  The wound breaks open.  You have severe pain.  You have signs of infection, such as: ? More redness, swelling, or pain. ? More fluid or blood. ? Warmth. ? Pus or a bad smell. ? Red streaks leading from the wound. ? A fever.  You see a sudden change in the color or texture of the drainage.  You have signs of dehydration, such as: ? Little or no tears, urine, or sweat. ? Muscle cramps. ? Very dry mouth. ? Headache. ? Dizziness. Summary  Negative pressure wound therapy (NPWT) is a device that helps your wound heal.  Set up a clean station for wound care. Your health care provider will tell you what supplies to use.  Follow your health care provider's instructions on how to clean your wound and how to change the dressing.  Contact a health care provider if you have new pain, an irritation, or a rash, or if the alarm goes off and you do not know what to do.  Get help right away if you have a lot of bleeding, your wound breaks open, or you have severe pain. Also, get help if you have signs of infection. This information is not intended to replace advice given to you by your health care provider. Make sure you discuss any questions you have with your health care provider. Document Released: 05/05/2011 Document Revised: 06/04/2018 Document Reviewed: 04/30/2018 Elsevier Patient Education  2020 [employer] A bland diet consists of foods that are often soft and do not have a lot of fat, fiber, or extra seasonings. Foods without fat, fiber, or seasoning are easier for the body  to digest. They are also less likely to irritate your mouth, throat, stomach, and other parts of your digestive system. A bland diet is sometimes called a BRAT diet. What is my plan? Your health care provider or food and nutrition specialist (dietitian) may recommend specific changes to your diet to prevent symptoms or to treat your symptoms. These changes may include:  Eating small meals often.  Cooking food until it is soft enough to chew easily.  Chewing your food well.  Drinking fluids slowly.  Not eating foods that are very spicy, sour, or fatty.  Not eating citrus fruits, such as oranges and grapefruit. What do I need to know about this diet?  Eat a variety of foods from the bland diet food list.  Do not follow a bland diet longer than needed.  Ask your health care provider whether you should take vitamins or supplements. What foods can I eat? Grains  Hot cereals, such as cream of wheat. Rice. Bread, crackers, or tortillas made from refined white flour. Vegetables Canned or cooked vegetables. Mashed or boiled potatoes. Fruits  Bananas. Applesauce. Other types of cooked or canned fruit with the skin and seeds removed, such as canned peaches or pears. Meats and other proteins  Scrambled eggs. Creamy peanut butter or other nut butters. Lean, well-cooked meats, such as chicken or fish. Tofu. Soups or broths. Dairy Low-fat dairy products, such as milk, cottage cheese, or yogurt. Beverages  Water. Herbal tea. Apple juice. Fats and oils Mild salad dressings. Canola or olive oil. Sweets and desserts Pudding. Custard. Fruit gelatin. Ice cream. The items listed above may not be a complete list of recommended foods and beverages. Contact a dietitian for more options. What foods are not recommended? Grains Whole grain breads and cereals. Vegetables Raw vegetables. Fruits Raw fruits, especially citrus, berries, or dried fruits. Dairy Whole fat dairy  foods. Beverages Caffeinated drinks. Alcohol. Seasonings and condiments Strongly flavored seasonings or condiments. Hot sauce. Salsa. Other foods Spicy foods. Fried foods. Sour foods, such as pickled or fermented foods. Foods with high sugar content. Foods high in fiber. The items listed above may not be a complete list of foods and beverages to avoid. Contact  a dietitian for more information. Summary  A bland diet consists of foods that are often soft and do not have a lot of fat, fiber, or extra seasonings.  Foods without fat, fiber, or seasoning are easier for the body to digest.  Check with your health care provider to see how long you should follow this diet plan. It is not meant to be followed for long periods. This information is not intended to replace advice given to you by your health care provider. Make sure you discuss any questions you have with your health care provider. Document Released: 06/04/2015 Document Revised: 03/11/2017 Document Reviewed: 03/11/2017 Elsevier Patient Education  2020 [employer].

## 2018-11-27 NOTE — TOC Transition Note (Signed)
Transition of Care [facility]) - CM/SW Discharge Note   Patient Details  Name: Catherine Christensen MRN: [id number] Date of Birth: 04-Mar-1965  [facility] CM/SW Contact:  Latanya Maudlin, RN Phone Number: 11/27/2018, 2:56 PM   Clinical Narrative:  Patient to be discharged per MD order. Orders in place for home health services. Previous TOC team member had set up home health through [facility] of discharge, per patient request I have provided her the number for the agency in case there are issues. Wound Vac obtained via [employer]. Formed signed by MD and given to [employer]. VAC supplies delivered to room and applied to the wound. Extra supplies sent home with patient. [employer] had also previously provided DME bedside commode and rolling walker. Family to transport home. No further TOC team needs.       Final next level of care: [place] Barriers to Discharge: No Barriers Identified   Patient Goals and CMS Choice Patient states their goals for this hospitalization and ongoing recovery are:: To go home. CMS Medicare.gov Compare Post Acute Care list provided to:: Patient Choice offered to / list presented to : Patient  Discharge Placement                       Discharge Plan and Services In-house Referral: Clinical Social Work   Post Acute Care Choice: Durable Medical Equipment, Home Health          DME Arranged: 3-N-1, Walker rolling, Negative pressure wound device DME Agency: [employer] Date DME Agency Contacted: 11/27/18 Time DME Agency Contacted: [phone] Representative spoke with at DME Agency: [place]: RN [place] Agency: [place] Date [place]: 11/27/18 Time [place]: 1456 Representative spoke with at [place]: cory  Social Determinants of Health ([place]) Interventions     Readmission Risk Interventions Readmission Risk Prevention Plan 11/27/2018  Post Dischage Appt Complete  Medication Screening Complete   Transportation Screening Complete  Some recent data might be hidden

## 2018-11-29 ENCOUNTER — Telehealth: Payer: Self-pay | Admitting: Obstetrics and Gynecology

## 2018-11-29 ENCOUNTER — Telehealth: Payer: Self-pay

## 2018-11-29 DIAGNOSIS — K5900 Constipation, unspecified: Secondary | ICD-10-CM | POA: Diagnosis not present

## 2018-11-29 DIAGNOSIS — Z792 Long term (current) use of antibiotics: Secondary | ICD-10-CM | POA: Diagnosis not present

## 2018-11-29 DIAGNOSIS — Z6834 Body mass index (BMI) 34.0-34.9, adult: Secondary | ICD-10-CM | POA: Diagnosis not present

## 2018-11-29 DIAGNOSIS — F329 Major depressive disorder, single episode, unspecified: Secondary | ICD-10-CM | POA: Diagnosis not present

## 2018-11-29 DIAGNOSIS — Z7984 Long term (current) use of oral hypoglycemic drugs: Secondary | ICD-10-CM | POA: Diagnosis not present

## 2018-11-29 DIAGNOSIS — Z90711 Acquired absence of uterus with remaining cervical stump: Secondary | ICD-10-CM | POA: Diagnosis not present

## 2018-11-29 DIAGNOSIS — I10 Essential (primary) hypertension: Secondary | ICD-10-CM | POA: Diagnosis not present

## 2018-11-29 DIAGNOSIS — Z48816 Encounter for surgical aftercare following surgery on the genitourinary system: Secondary | ICD-10-CM | POA: Diagnosis not present

## 2018-11-29 DIAGNOSIS — F1010 Alcohol abuse, uncomplicated: Secondary | ICD-10-CM | POA: Diagnosis not present

## 2018-11-29 DIAGNOSIS — Z4801 Encounter for change or removal of surgical wound dressing: Secondary | ICD-10-CM | POA: Diagnosis not present

## 2018-11-29 DIAGNOSIS — K219 Gastro-esophageal reflux disease without esophagitis: Secondary | ICD-10-CM | POA: Diagnosis not present

## 2018-11-29 DIAGNOSIS — F5104 Psychophysiologic insomnia: Secondary | ICD-10-CM | POA: Diagnosis not present

## 2018-11-29 DIAGNOSIS — D649 Anemia, unspecified: Secondary | ICD-10-CM | POA: Diagnosis not present

## 2018-11-29 DIAGNOSIS — E119 Type 2 diabetes mellitus without complications: Secondary | ICD-10-CM | POA: Diagnosis not present

## 2018-11-29 DIAGNOSIS — Z79891 Long term (current) use of opiate analgesic: Secondary | ICD-10-CM | POA: Diagnosis not present

## 2018-11-29 DIAGNOSIS — E669 Obesity, unspecified: Secondary | ICD-10-CM | POA: Diagnosis not present

## 2018-11-29 NOTE — Telephone Encounter (Signed)
Pt called after hour triage line stating she was having post op constipation.   I called pt to get an update on how she was. She states she tried the laxative the triage nurse suggested and is feeling much better. She states she has not been able to get outside to walk d/t wound vac.   Advised walking, increasing water intake and high fiber diet. Pt voiced an understanding and also states she will keep post op appointment for tomorrow.

## 2018-11-29 NOTE — Telephone Encounter (Signed)
Patient is aware of schedule appointment

## 2018-11-29 NOTE — Telephone Encounter (Signed)
-----   Message from Homero Fellers, MD sent at 11/29/2018  1:10 PM EDT ----- Please add this patient on for a 1:15 appointment tomorrow on 11/30/2018 Patient aware.  Thank you,  Dr. Gilman Schmidt

## 2018-11-29 NOTE — Progress Notes (Signed)
Called and discussed normal result with patient.  Patient is doing well since surgery.

## 2018-11-30 ENCOUNTER — Other Ambulatory Visit: Payer: Self-pay

## 2018-11-30 ENCOUNTER — Encounter: Payer: Self-pay | Admitting: Obstetrics and Gynecology

## 2018-11-30 ENCOUNTER — Ambulatory Visit (INDEPENDENT_AMBULATORY_CARE_PROVIDER_SITE_OTHER): Payer: BC Managed Care – PPO | Admitting: Obstetrics and Gynecology

## 2018-11-30 VITALS — BP 112/60 | HR 87 | Ht 67.5 in | Wt 219.0 lb

## 2018-11-30 DIAGNOSIS — Z4889 Encounter for other specified surgical aftercare: Secondary | ICD-10-CM

## 2018-12-01 ENCOUNTER — Encounter: Payer: Self-pay | Admitting: Obstetrics and Gynecology

## 2018-12-01 NOTE — Progress Notes (Signed)
  Postoperative Follow-up Patient presents post op from hysterectomy for fibroids, 3 weeks ago.  Subjective: Patient reports marked improvement in her preop symptoms. Eating a bland soft diet without difficulty. Pain is controlled with current analgesics .  Activity: ambulating in house. Patient reports additional symptom's since surgery of None.  Objective: BP 112/60   Pulse 87   Ht 5' 7.5" (1.715 m)   Wt 219 lb (99.3 kg)   LMP 10/26/2018   BMI 33.79 kg/m  Physical Exam Constitutional:      Appearance: She is well-developed.  Genitourinary:     Vagina and uterus normal.     No lesions in the vagina.     No cervical motion tenderness.     No right or left adnexal mass present.  HENT:     Head: Normocephalic and atraumatic.  Neck:     Musculoskeletal: Neck supple.     Thyroid: No thyromegaly.  Cardiovascular:     Rate and Rhythm: Normal rate and regular rhythm.     Heart sounds: Normal heart sounds.  Pulmonary:     Effort: Pulmonary effort is normal.     Breath sounds: Normal breath sounds.  Chest:     Breasts:        Right: No inverted nipple, mass, nipple discharge or skin change.        Left: No inverted nipple, mass, nipple discharge or skin change.  Abdominal:     General: Bowel sounds are normal. There is no distension.     Palpations: Abdomen is soft. There is no mass.       Comments: Wound inspected. Clean wound base. Wound vac no longer needed.  Neurological:     Mental Status: She is alert and oriented to person, place, and time.  Skin:    General: Skin is warm and dry.  Psychiatric:        Behavior: Behavior normal.        Thought Content: Thought content normal.        Judgment: Judgment normal.  Vitals signs reviewed.     Assessment: s/p :  hysterectomy stable  Plan: Patient has done well after surgery with no apparent complications.  I have discussed the post-operative course to date, and the expected progress moving forward.  The patient  understands what complications to be concerned about.  I will see the patient in routine follow up, or sooner if needed.    Activity plan:continue gradual increase Pelvic rest.  Continue to monitor blood glucose. Today in office it is 120. Goal is 80-180 for optimal wound healing.  Discussed safe foods after surgery.  Discussed wound cleaning and bandage application.  Return in 1 week for office visit.  Will send FMLA paper for expected 8 week absence from work.    Tilly Pernice R Ahmod Gillespie 12/01/2018, 12:28 AM

## 2018-12-02 ENCOUNTER — Telehealth: Payer: Self-pay | Admitting: *Deleted

## 2018-12-02 ENCOUNTER — Encounter: Payer: Self-pay | Admitting: General Surgery

## 2018-12-02 ENCOUNTER — Other Ambulatory Visit: Payer: Self-pay

## 2018-12-02 ENCOUNTER — Ambulatory Visit (INDEPENDENT_AMBULATORY_CARE_PROVIDER_SITE_OTHER): Payer: Self-pay | Admitting: General Surgery

## 2018-12-02 VITALS — BP 135/82 | HR 85 | Temp 97.5°F | Resp 16 | Ht 67.5 in | Wt 216.6 lb

## 2018-12-02 DIAGNOSIS — S36503D Unspecified injury of sigmoid colon, subsequent encounter: Secondary | ICD-10-CM

## 2018-12-02 DIAGNOSIS — S36409D Unspecified injury of unspecified part of small intestine, subsequent encounter: Secondary | ICD-10-CM

## 2018-12-02 NOTE — Telephone Encounter (Signed)
Patient called and stated that she just left the office and forgot to ask Dr.Cannon if she can have [place] food. Please call and advise

## 2018-12-02 NOTE — Patient Instructions (Addendum)
Please continue with a fiber supplement. You may use soap and water with cleaning the area. A&D ointment will also help with keeping the skin moisturized. Please contact our office if you develop any questions or concerns.   GENERAL POST-OPERATIVE PATIENT INSTRUCTIONS   WOUND CARE INSTRUCTIONS:  Keep a dry clean dressing on the wound if there is drainage. The initial bandage may be removed after 24 hours.  Once the wound has quit draining you may leave it open to air.  If clothing rubs against the wound or causes irritation and the wound is not draining you may cover it with a dry dressing during the daytime.  Try to keep the wound dry and avoid ointments on the wound unless directed to do so.  If the wound becomes bright red and painful or starts to drain infected material that is not clear, please contact your physician immediately.  If the wound is mildly pink and has a thick firm ridge underneath it, this is normal, and is referred to as a healing ridge.  This will resolve over the next 4-6 weeks.  BATHING: You may shower if you have been informed of this by your surgeon. However, Please do not submerge in a tub, hot tub, or pool until incisions are completely sealed or have been told by your surgeon that you may do so.  DIET:  You may eat any foods that you can tolerate.  It is a good idea to eat a high fiber diet and take in plenty of fluids to prevent constipation.  If you do become constipated you may want to take a mild laxative or take ducolax tablets on a daily basis until your bowel habits are regular.  Constipation can be very uncomfortable, along with straining, after recent surgery.  ACTIVITY:  You are encouraged to cough and deep breath or use your incentive spirometer if you were given one, every 15-30 minutes when awake.  This will help prevent respiratory complications and low grade fevers post-operatively if you had a general anesthetic.  You may want to hug a pillow when coughing and  sneezing to add additional support to the surgical area, if you had abdominal or chest surgery, which will decrease pain during these times.  You are encouraged to walk and engage in light activity for the next two weeks.  You should not lift more than 20 pounds, until 12/21/2018 as it could put you at increased risk for complications.  Twenty pounds is roughly equivalent to a plastic bag of groceries. At that time- Listen to your body when lifting, if you have pain when lifting, stop and then try again in a few days. Soreness after doing exercises or activities of daily living is normal as you get back in to your normal routine.  MEDICATIONS:  Try to take narcotic medications and anti-inflammatory medications, such as tylenol, ibuprofen, naprosyn, etc., with food.  This will minimize stomach upset from the medication.  Should you develop nausea and vomiting from the pain medication, or develop a rash, please discontinue the medication and contact your physician.  You should not drive, make important decisions, or operate machinery when taking narcotic pain medication.  SUNBLOCK Use sun block to incision area over the next year if this area will be exposed to sun. This helps decrease scarring and will allow you avoid a permanent darkened area over your incision.  QUESTIONS:  Please feel free to call our office if you have any questions, and we will be glad  to assist you. [phone]

## 2018-12-02 NOTE — Progress Notes (Signed)
Catherine Christensen is here today for a postoperative visit.  She is a 53 year old woman who suffered a perforation of her colon and a small bowel injury during a very technically challenging hysterectomy.  General surgery was involved for management of these 2 issues.  She had a partial sigmoid colectomy with a primary stapled anastomosis as well as primary repair of the small bowel injury.  Her postoperative course was prolonged secondary to an ileus.  She also had a small area of wound infection.  Ultimately, however she was able to be discharged home.  Since her discharge, she has experienced some constipation.  She has been using suppositories and milk of magnesia to alleviate this.  She says that sometimes, she has soft formed stools but often has very loose ones as well.  He has been on a soft bland diet since her discharge and is eager to try different foods.  She denies any nausea or vomiting.  No fevers or chills.   Today's Vitals   12/02/18 1546  BP: 135/82  Pulse: 85  Resp: 16  Temp: (!) 97.5 F (36.4 C)  TempSrc: Temporal  SpO2: 96%  Weight: 216 lb 9.6 oz (98.2 kg)  Height: 5' 7.5" (1.715 m)  PainSc: 0-No pain   Body mass index is 33.42 kg/m.  Focused abdominal exam: The vertical midline incision is healing well.  There is scar hypertrophy occurring.  There is some crusting around the umbilicus.  The small open area has an alginate dressing.  This was removed.  The open area is healing nicely and there is no evidence of infection.    Impression and plan: This is a 53 year old woman who was undergoing a technically challenging laparoscopic hysterectomy when fecal soilage was identified.  Upon exploration by myself, she was found to have 2 large holes in her sigmoid colon as well as a small bowel deserosalization.  The perforated colon was resected and primarily anastomosed in the small bowel was repaired.  Overall, considering her prolonged hospitalization, she is doing well.  I have suggested  that she try taking a daily fiber supplement to avoid the cycle of constipation to diarrhea that seems to be occurring with milk of magnesia.  I also suggested that she could try adding foods that she prefers back into her diet, but that she should proceed with caution and minimize the amount of fatty, fried, or spicy foods that she takes in and only eat small amounts of these while she waits to see how her body will respond.  She should continue managing her wound at home as she has been.  I have encouraged her to let hot soapy water run over her incision to help with some of the scaling and crusting.  She may continue to apply A&E ointment as she has been.  She should continue to refrain from lifting anything heavier than 10 pounds for a total of 6 weeks from the time of her surgery.  She should follow-up with Dr. Nechama Guard as scheduled.  We will see her on an as-needed basis.

## 2018-12-02 NOTE — Telephone Encounter (Signed)
Spoke with patient and discussed eating chinese food, low mien or fried rice she stated she was instructed at her visit today to ease into eating the foods she likes to see how they will affect her.

## 2018-12-03 DIAGNOSIS — Z6834 Body mass index (BMI) 34.0-34.9, adult: Secondary | ICD-10-CM | POA: Diagnosis not present

## 2018-12-03 DIAGNOSIS — F329 Major depressive disorder, single episode, unspecified: Secondary | ICD-10-CM | POA: Diagnosis not present

## 2018-12-03 DIAGNOSIS — Z4801 Encounter for change or removal of surgical wound dressing: Secondary | ICD-10-CM | POA: Diagnosis not present

## 2018-12-03 DIAGNOSIS — K5900 Constipation, unspecified: Secondary | ICD-10-CM | POA: Diagnosis not present

## 2018-12-03 DIAGNOSIS — E119 Type 2 diabetes mellitus without complications: Secondary | ICD-10-CM | POA: Diagnosis not present

## 2018-12-03 DIAGNOSIS — E669 Obesity, unspecified: Secondary | ICD-10-CM | POA: Diagnosis not present

## 2018-12-03 DIAGNOSIS — T8189XA Other complications of procedures, not elsewhere classified, initial encounter: Secondary | ICD-10-CM | POA: Diagnosis not present

## 2018-12-03 DIAGNOSIS — Z79891 Long term (current) use of opiate analgesic: Secondary | ICD-10-CM | POA: Diagnosis not present

## 2018-12-03 DIAGNOSIS — K219 Gastro-esophageal reflux disease without esophagitis: Secondary | ICD-10-CM | POA: Diagnosis not present

## 2018-12-03 DIAGNOSIS — I10 Essential (primary) hypertension: Secondary | ICD-10-CM | POA: Diagnosis not present

## 2018-12-03 DIAGNOSIS — D649 Anemia, unspecified: Secondary | ICD-10-CM | POA: Diagnosis not present

## 2018-12-03 DIAGNOSIS — F5104 Psychophysiologic insomnia: Secondary | ICD-10-CM | POA: Diagnosis not present

## 2018-12-03 DIAGNOSIS — Z48816 Encounter for surgical aftercare following surgery on the genitourinary system: Secondary | ICD-10-CM | POA: Diagnosis not present

## 2018-12-03 DIAGNOSIS — Z7984 Long term (current) use of oral hypoglycemic drugs: Secondary | ICD-10-CM | POA: Diagnosis not present

## 2018-12-03 DIAGNOSIS — Z792 Long term (current) use of antibiotics: Secondary | ICD-10-CM | POA: Diagnosis not present

## 2018-12-03 DIAGNOSIS — Z90711 Acquired absence of uterus with remaining cervical stump: Secondary | ICD-10-CM | POA: Diagnosis not present

## 2018-12-03 DIAGNOSIS — F1010 Alcohol abuse, uncomplicated: Secondary | ICD-10-CM | POA: Diagnosis not present

## 2018-12-06 DIAGNOSIS — K219 Gastro-esophageal reflux disease without esophagitis: Secondary | ICD-10-CM | POA: Diagnosis not present

## 2018-12-06 DIAGNOSIS — Z79891 Long term (current) use of opiate analgesic: Secondary | ICD-10-CM | POA: Diagnosis not present

## 2018-12-06 DIAGNOSIS — E119 Type 2 diabetes mellitus without complications: Secondary | ICD-10-CM | POA: Diagnosis not present

## 2018-12-06 DIAGNOSIS — F1010 Alcohol abuse, uncomplicated: Secondary | ICD-10-CM | POA: Diagnosis not present

## 2018-12-06 DIAGNOSIS — Z4801 Encounter for change or removal of surgical wound dressing: Secondary | ICD-10-CM | POA: Diagnosis not present

## 2018-12-06 DIAGNOSIS — F5104 Psychophysiologic insomnia: Secondary | ICD-10-CM | POA: Diagnosis not present

## 2018-12-06 DIAGNOSIS — Z6834 Body mass index (BMI) 34.0-34.9, adult: Secondary | ICD-10-CM | POA: Diagnosis not present

## 2018-12-06 DIAGNOSIS — Z48816 Encounter for surgical aftercare following surgery on the genitourinary system: Secondary | ICD-10-CM | POA: Diagnosis not present

## 2018-12-06 DIAGNOSIS — F329 Major depressive disorder, single episode, unspecified: Secondary | ICD-10-CM | POA: Diagnosis not present

## 2018-12-06 DIAGNOSIS — I10 Essential (primary) hypertension: Secondary | ICD-10-CM | POA: Diagnosis not present

## 2018-12-06 DIAGNOSIS — Z90711 Acquired absence of uterus with remaining cervical stump: Secondary | ICD-10-CM | POA: Diagnosis not present

## 2018-12-06 DIAGNOSIS — E669 Obesity, unspecified: Secondary | ICD-10-CM | POA: Diagnosis not present

## 2018-12-06 DIAGNOSIS — D649 Anemia, unspecified: Secondary | ICD-10-CM | POA: Diagnosis not present

## 2018-12-06 DIAGNOSIS — K5900 Constipation, unspecified: Secondary | ICD-10-CM | POA: Diagnosis not present

## 2018-12-06 DIAGNOSIS — Z7984 Long term (current) use of oral hypoglycemic drugs: Secondary | ICD-10-CM | POA: Diagnosis not present

## 2018-12-06 DIAGNOSIS — Z792 Long term (current) use of antibiotics: Secondary | ICD-10-CM | POA: Diagnosis not present

## 2018-12-09 DIAGNOSIS — Z79891 Long term (current) use of opiate analgesic: Secondary | ICD-10-CM | POA: Diagnosis not present

## 2018-12-09 DIAGNOSIS — Z90711 Acquired absence of uterus with remaining cervical stump: Secondary | ICD-10-CM | POA: Diagnosis not present

## 2018-12-09 DIAGNOSIS — Z792 Long term (current) use of antibiotics: Secondary | ICD-10-CM | POA: Diagnosis not present

## 2018-12-09 DIAGNOSIS — Z48816 Encounter for surgical aftercare following surgery on the genitourinary system: Secondary | ICD-10-CM | POA: Diagnosis not present

## 2018-12-09 DIAGNOSIS — F5104 Psychophysiologic insomnia: Secondary | ICD-10-CM | POA: Diagnosis not present

## 2018-12-09 DIAGNOSIS — E119 Type 2 diabetes mellitus without complications: Secondary | ICD-10-CM | POA: Diagnosis not present

## 2018-12-09 DIAGNOSIS — Z4801 Encounter for change or removal of surgical wound dressing: Secondary | ICD-10-CM | POA: Diagnosis not present

## 2018-12-09 DIAGNOSIS — K219 Gastro-esophageal reflux disease without esophagitis: Secondary | ICD-10-CM | POA: Diagnosis not present

## 2018-12-09 DIAGNOSIS — F1010 Alcohol abuse, uncomplicated: Secondary | ICD-10-CM | POA: Diagnosis not present

## 2018-12-09 DIAGNOSIS — Z7984 Long term (current) use of oral hypoglycemic drugs: Secondary | ICD-10-CM | POA: Diagnosis not present

## 2018-12-09 DIAGNOSIS — F329 Major depressive disorder, single episode, unspecified: Secondary | ICD-10-CM | POA: Diagnosis not present

## 2018-12-09 DIAGNOSIS — E669 Obesity, unspecified: Secondary | ICD-10-CM | POA: Diagnosis not present

## 2018-12-09 DIAGNOSIS — K5900 Constipation, unspecified: Secondary | ICD-10-CM | POA: Diagnosis not present

## 2018-12-09 DIAGNOSIS — Z6834 Body mass index (BMI) 34.0-34.9, adult: Secondary | ICD-10-CM | POA: Diagnosis not present

## 2018-12-09 DIAGNOSIS — I10 Essential (primary) hypertension: Secondary | ICD-10-CM | POA: Diagnosis not present

## 2018-12-09 DIAGNOSIS — D649 Anemia, unspecified: Secondary | ICD-10-CM | POA: Diagnosis not present

## 2018-12-10 ENCOUNTER — Other Ambulatory Visit: Payer: Self-pay

## 2018-12-10 ENCOUNTER — Ambulatory Visit (INDEPENDENT_AMBULATORY_CARE_PROVIDER_SITE_OTHER): Payer: BC Managed Care – PPO | Admitting: Obstetrics and Gynecology

## 2018-12-10 ENCOUNTER — Encounter: Payer: Self-pay | Admitting: Obstetrics and Gynecology

## 2018-12-10 VITALS — BP 130/80 | HR 88 | Ht 67.5 in | Wt 209.0 lb

## 2018-12-10 DIAGNOSIS — Z90710 Acquired absence of both cervix and uterus: Secondary | ICD-10-CM

## 2018-12-13 NOTE — Progress Notes (Signed)
  Postoperative Follow-up Patient presents post op from a total abdominal hysterectomy complicated by bowel injury which was repaired with with partial sigmoid colectomy with a primary stapled anastomosis as well as primary repair of the small bowel injury on 11/09/2018 .   Subjective: Patient reports marked improvement in her preop symptoms. Eating a bland diet without difficulty.Added in foods such as a hot dog and tolerated this. Pain is controlled with current analgesics. Medications being used: ibuprofen (OTC).  Activity: physically active walking. Patient reports additional symptom's since surgery of None.   Objective: BP 130/80   Pulse 88   Ht 5' 7.5" (1.715 m)   Wt 209 lb (94.8 kg)   LMP 10/26/2018   BMI 32.25 kg/m  Physical Exam Constitutional:      Appearance: She is well-developed.  Genitourinary:     Vagina and uterus normal.     No lesions in the vagina.     No cervical motion tenderness.     No right or left adnexal mass present.  HENT:     Head: Normocephalic and atraumatic.  Neck:     Musculoskeletal: Neck supple.     Thyroid: No thyromegaly.  Cardiovascular:     Rate and Rhythm: Normal rate and regular rhythm.     Heart sounds: Normal heart sounds.  Pulmonary:     Effort: Pulmonary effort is normal.     Breath sounds: Normal breath sounds.  Chest:     Breasts:        Right: No inverted nipple, mass, nipple discharge or skin change.        Left: No inverted nipple, mass, nipple discharge or skin change.  Abdominal:     General: Bowel sounds are normal. There is no distension.     Palpations: Abdomen is soft. There is no mass.       Comments: Continued improvement in abdominal incision healing.  Neurological:     Mental Status: She is alert and oriented to person, place, and time.  Skin:    General: Skin is warm and dry.  Psychiatric:        Behavior: Behavior normal.        Thought Content: Thought content normal.        Judgment: Judgment normal.   Vitals signs reviewed.     Assessment: s/p :  hysterectomy stable  Plan: Patient has done well after surgery with no apparent complications.  I have discussed the post-operative course to date, and the expected progress moving forward.  The patient understands what complications to be concerned about.  I will see the patient in routine follow up, or sooner if needed.    Activity plan: No heavy lifting May return to work in November.  Pelvic rest.   R  12/13/2018, 7:33 AM

## 2018-12-21 ENCOUNTER — Other Ambulatory Visit: Payer: Self-pay | Admitting: Obstetrics and Gynecology

## 2018-12-23 ENCOUNTER — Other Ambulatory Visit: Payer: Self-pay | Admitting: Obstetrics and Gynecology

## 2018-12-24 ENCOUNTER — Other Ambulatory Visit: Payer: Self-pay | Admitting: Family Medicine

## 2018-12-24 MED ORDER — MICROLET LANCETS MISC: 1.0000 | Freq: Every day | 0 refills | Status: DC

## 2018-12-24 NOTE — Telephone Encounter (Signed)
Pt request refill  Microlet lancets for contour device  [facility] #[id number] - [place], [place] - [address] [phone] (Phone) [phone] (Fax)

## 2018-12-27 ENCOUNTER — Other Ambulatory Visit: Payer: Self-pay | Admitting: Family Medicine

## 2018-12-27 DIAGNOSIS — I10 Essential (primary) hypertension: Secondary | ICD-10-CM

## 2018-12-31 ENCOUNTER — Encounter: Payer: Self-pay | Admitting: Obstetrics and Gynecology

## 2018-12-31 ENCOUNTER — Other Ambulatory Visit: Payer: Self-pay | Admitting: Obstetrics and Gynecology

## 2018-12-31 ENCOUNTER — Ambulatory Visit (INDEPENDENT_AMBULATORY_CARE_PROVIDER_SITE_OTHER): Payer: BC Managed Care – PPO | Admitting: Obstetrics and Gynecology

## 2018-12-31 ENCOUNTER — Other Ambulatory Visit: Payer: Self-pay

## 2018-12-31 VITALS — BP 120/80 | Ht 67.5 in | Wt 210.0 lb

## 2018-12-31 DIAGNOSIS — N63 Unspecified lump in unspecified breast: Secondary | ICD-10-CM

## 2018-12-31 DIAGNOSIS — Z1231 Encounter for screening mammogram for malignant neoplasm of breast: Secondary | ICD-10-CM

## 2018-12-31 DIAGNOSIS — R928 Other abnormal and inconclusive findings on diagnostic imaging of breast: Secondary | ICD-10-CM

## 2018-12-31 DIAGNOSIS — F419 Anxiety disorder, unspecified: Secondary | ICD-10-CM

## 2018-12-31 MED ORDER — ESCITALOPRAM OXALATE 10 MG PO TABS: 10.0000 mg | ORAL_TABLET | Freq: Every day | ORAL | 1 refills | Status: DC

## 2018-12-31 NOTE — Progress Notes (Signed)
  Postoperative Follow-up Patient presents post op from  total abdominal hysterectomy complicated by bowel injury which was repaired with with partial sigmoid colectomy with a primary stapled anastomosis as well as primary repair of the small bowel injury  for abnormal uterine bleeding and fibroids, 7 weeks ago.  Subjective: Patient reports marked improvement in her preop symptoms. Eating a regular diet without difficulty. Pain is controlled without any medications.  Activity: normal activities of daily living. Patient reports additional symptom's since surgery of None.  Objective: BP 120/80   Ht 5' 7.5" (1.715 m)   Wt 210 lb (95.3 kg)   LMP 10/26/2018   BMI 32.41 kg/m  Physical Exam Constitutional:      Appearance: She is well-developed.  Genitourinary:     Vagina and uterus normal.     No lesions in the vagina.     No cervical motion tenderness.     No right or left adnexal mass present.  HENT:     Head: Normocephalic and atraumatic.  Neck:     Musculoskeletal: Neck supple.     Thyroid: No thyromegaly.  Cardiovascular:     Rate and Rhythm: Normal rate and regular rhythm.     Heart sounds: Normal heart sounds.  Pulmonary:     Effort: Pulmonary effort is normal.     Breath sounds: Normal breath sounds.  Chest:     Breasts:        Right: No inverted nipple, mass, nipple discharge or skin change.        Left: No inverted nipple, mass, nipple discharge or skin change.  Abdominal:     General: Abdomen is flat. Bowel sounds are normal. There is no distension.     Palpations: Abdomen is soft. There is no mass.     Comments: Incision well healed  Neurological:     Mental Status: She is alert and oriented to person, place, and time.  Skin:    General: Skin is warm and dry.  Psychiatric:        Behavior: Behavior normal.        Thought Content: Thought content normal.        Judgment: Judgment normal.  Vitals signs reviewed.    Assessment: s/p : - Stable, progressing well  Plan: Patient has done well after surgery with no apparent complications.  I have discussed the post-operative course to date, and the expected progress moving forward.  The patient understands what complications to be concerned about.  I will see the patient in routine follow up, or sooner if needed.    GAD7: 13 Will start on Lexapro, follow up at next visit.  Mammogram ordered today.  Given note to return to full duty at work Continue metformin and BP medications  Patient doing very well after surgery. Reports better bowel movements now than before her surgery, much less constipated.   Activity plan: No restriction. No heavy lifting more than 15lbs.  Pelvic rest.  [name] 12/31/2018, 2:15 PM

## 2019-01-10 ENCOUNTER — Telehealth: Payer: Self-pay | Admitting: Obstetrics and Gynecology

## 2019-01-10 NOTE — Telephone Encounter (Signed)
Patient aware of her appt at [facility] on 02/15/2019 @ 2:00pm

## 2019-01-11 ENCOUNTER — Other Ambulatory Visit: Payer: Self-pay

## 2019-01-11 ENCOUNTER — Encounter: Payer: Self-pay | Admitting: Family Medicine

## 2019-01-11 ENCOUNTER — Ambulatory Visit: Payer: BC Managed Care – PPO | Admitting: Family Medicine

## 2019-01-11 VITALS — BP 110/70 | HR 102 | Temp 97.1°F | Resp 16 | Ht 67.5 in | Wt 208.1 lb

## 2019-01-11 DIAGNOSIS — E785 Hyperlipidemia, unspecified: Secondary | ICD-10-CM | POA: Diagnosis not present

## 2019-01-11 DIAGNOSIS — F320 Major depressive disorder, single episode, mild: Secondary | ICD-10-CM | POA: Diagnosis not present

## 2019-01-11 DIAGNOSIS — D500 Iron deficiency anemia secondary to blood loss (chronic): Secondary | ICD-10-CM

## 2019-01-11 DIAGNOSIS — I10 Essential (primary) hypertension: Secondary | ICD-10-CM

## 2019-01-11 DIAGNOSIS — G4700 Insomnia, unspecified: Secondary | ICD-10-CM

## 2019-01-11 DIAGNOSIS — R7303 Prediabetes: Secondary | ICD-10-CM | POA: Diagnosis not present

## 2019-01-11 DIAGNOSIS — K5909 Other constipation: Secondary | ICD-10-CM

## 2019-01-11 DIAGNOSIS — E441 Mild protein-calorie malnutrition: Secondary | ICD-10-CM

## 2019-01-11 DIAGNOSIS — Z90710 Acquired absence of both cervix and uterus: Secondary | ICD-10-CM

## 2019-01-11 MED ORDER — POLYETHYLENE GLYCOL 3350 17 GM/SCOOP PO POWD: 17.0000 g | Freq: Every day | ORAL | 1 refills | Status: DC

## 2019-01-11 MED ORDER — QUETIAPINE FUMARATE 25 MG PO TABS: 25.0000 mg | ORAL_TABLET | Freq: Every day | ORAL | 0 refills | Status: DC

## 2019-01-11 NOTE — Progress Notes (Addendum)
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     Status: None   Collection Time: 11/11/18  8:05 AM  Result Value Ref Range   Magnesium 2.0 1.7 - 2.4 mg/dL    Comment: Performed at [facility], [address], [place], [place] [address]  Phosphorus     Status: Abnormal   Collection Time: 11/11/18  8:05 AM  Result  Value Ref Range   Phosphorus 1.6 (L) 2.5 - 4.6 mg/dL    Comment: Performed at [facility], [address] [place], [place] [address]  Glucose, capillary     Status: Abnormal   Collection Time: 11/11/18 12:00 PM  Result Value Ref Range   Glucose-Capillary 138 (H) 70 - 99 mg/dL  Glucose, capillary     Status: Abnormal   Collection Time: 11/11/18  4:20 PM  Result Value Ref Range   Glucose-Capillary 137 (H) 70 - 99 mg/dL  Phosphorus     Status: Abnormal   Collection Time: 11/11/18  5:17 PM  Result Value Ref Range   Phosphorus 2.4 (L) 2.5 - 4.6 mg/dL    Comment: Performed at [facility], [address] [place], [place] [address]  Glucose, capillary     Status: Abnormal   Collection Time: 11/11/18  7:32 PM  Result Value Ref Range   Glucose-Capillary 116 (H) 70 - 99 mg/dL  Glucose, capillary     Status: Abnormal   Collection Time: 11/11/18 11:20 PM  Result Value Ref Range   Glucose-Capillary 182 (H) 70 - 99 mg/dL  Glucose, capillary     Status: Abnormal   Collection Time: 11/12/18  2:53 AM  Result Value Ref Range   Glucose-Capillary 168 (H) 70 - 99 mg/dL  Glucose, capillary     Status: Abnormal   Collection Time: 11/12/18  7:45 AM  Result Value Ref Range   Glucose-Capillary 169 (H) 70 - 99 mg/dL  Prealbumin     Status: Abnormal   Collection Time: 11/12/18  7:53 AM  Result Value Ref Range   Prealbumin 13.5 (L) 18 - 38 mg/dL    Comment: Performed at [facility] [address], [place], [place] [address]  CBC     Status: Abnormal   Collection Time: 11/12/18  7:53 AM  Result Value Ref Range   WBC 13.9 (H) 4.0 - 10.5 K/uL   RBC 3.65 (L) 3.87 - 5.11 MIL/uL   Hemoglobin 9.7 (L) 12.0 - 15.0 g/dL   HCT 29.9 (L) 36.0 - 46.0 %   MCV 81.9 80.0 - 100.0 fL   MCH 26.6 26.0 - 34.0 pg   MCHC 32.4 30.0 - 36.0 g/dL   RDW 14.7  11.5 - 15.5 %   Platelets 356 150 - 400 K/uL   nRBC 0.1 0.0 - 0.2 %    Comment: Performed at [facility], [address] [place], [place] [address]  Differential     Status: Abnormal   Collection Time: 11/12/18  7:53 AM  Result Value Ref Range   Neutrophils Relative % 83 %   Neutro Abs 11.4 (H) 1.7 - 7.7 K/uL   Lymphocytes Relative 11 %   Lymphs Abs 1.6 0.7 - 4.0 K/uL   Monocytes Relative 4 %   Monocytes Absolute 0.6 0.1 - 1.0 K/uL   Eosinophils Relative 0 %   Eosinophils Absolute 0.0 0.0 - 0.5 K/uL   Basophils Relative 0 %   Basophils Absolute 0.0 0.0 - 0.1 K/uL   Immature Granulocytes 2 %   Abs Immature Granulocytes 0.27 (H) 0.00 - 0.07 K/uL    Comment: Performed at [facility], [address] [place], [place] [address]  Magnesium     Status: None   Collection Time: 11/12/18  7:53 AM  Result Value Ref Range   Magnesium 2.2 1.7 - 2.4 mg/dL    Comment: Performed at [facility], [address],  [place], [place] [address]  Phosphorus     Status: Abnormal   Collection Time: 11/12/18  7:53 AM  Result Value Ref Range   Phosphorus 2.2 (L) 2.5 - 4.6 mg/dL    Comment: Performed at [facility], [address] [place], [place] [address]  Comprehensive metabolic panel     Status: Abnormal   Collection Time: 11/12/18  7:53 AM  Result Value Ref Range   Sodium 140 135 - 145 mmol/L   Potassium 4.1 3.5 - 5.1 mmol/L   Chloride 109 98 - 111 mmol/L   CO2 24 22 - 32 mmol/L   Glucose, Bld 160 (H) 70 - 99 mg/dL   BUN 11 6 - 20 mg/dL   Creatinine, Ser 0.74 0.44 - 1.00 mg/dL   Calcium 8.0 (L) 8.9 - 10.3 mg/dL   Total Protein 5.8 (L) 6.5 - 8.1 g/dL   Albumin 2.6 (L) 3.5 - 5.0 g/dL   AST 34 15 - 41 U/L   ALT 27 0 - 44 U/L   Alkaline Phosphatase 32 (L) 38 - 126 U/L   Total Bilirubin 0.5 0.3 - 1.2 mg/dL   GFR calc non Af Amer >60 >60 mL/min   GFR calc Af Amer >60 >60 mL/min   Anion gap 7 5 - 15    Comment: Performed at [facility], [address] [place], [place] [address]  Triglycerides     Status: Abnormal   Collection Time: 11/12/18  7:54 AM  Result Value Ref Range   Triglycerides 395 (H) <150 mg/dL    Comment: Performed at [facility], [address] [place], [place] [address]  Glucose, capillary     Status: Abnormal   Collection Time: 11/12/18 11:45 AM  Result Value Ref Range   Glucose-Capillary 152 (H) 70 - 99 mg/dL  Glucose, capillary     Status: Abnormal   Collection Time: 11/12/18  3:48 PM  Result Value Ref Range   Glucose-Capillary 140 (H) 70 - 99 mg/dL  Glucose, capillary     Status: Abnormal   Collection Time: 11/12/18  8:25 PM  Result Value Ref Range   Glucose-Capillary 120 (H) 70 - 99 mg/dL  Glucose, capillary     Status: Abnormal   Collection Time: 11/13/18 12:01 AM  Result Value Ref Range   Glucose-Capillary 106 (H) 70 - 99 mg/dL  Magnesium     Status: None   Collection Time: 11/13/18  4:15 AM  Result Value Ref Range   Magnesium 2.0 1.7 - 2.4 mg/dL    Comment: Performed at [facility], [address] [place], [place] [address]  Phosphorus     Status: None   Collection Time: 11/13/18  4:15 AM  Result Value Ref Range   Phosphorus 3.1 2.5 - 4.6 mg/dL    Comment: Performed at [facility], [address] [place], [place] [address]  Comprehensive metabolic panel     Status: Abnormal   Collection Time: 11/13/18  4:15 AM  Result Value Ref Range   Sodium 141 135 - 145 mmol/L   Potassium 3.4 (L) 3.5 - 5.1 mmol/L   Chloride 110 98 - 111 mmol/L   CO2 25 22 - 32 mmol/L   Glucose, Bld 141 (H) 70 - 99 mg/dL   BUN 11 6 - 20 mg/dL   Creatinine, Ser 0.58 0.44 - 1.00 mg/dL   Calcium 7.9 (L) 8.9 - 10.3 mg/dL   Total Protein 5.6 (L) 6.5 - 8.1 g/dL   Albumin 2.4 (  L) 3.5 - 5.0 g/dL   AST 26 15 - 41 U/L   ALT 23 0 - 44 U/L   Alkaline Phosphatase 25 (L) 38 - 126 U/L   Total Bilirubin 0.4 0.3 - 1.2 mg/dL   GFR calc non Af Amer >60 >60 mL/min   GFR calc Af Amer >60 >60  mL/min   Anion gap 6 5 - 15    Comment: Performed at [facility], [address] [place], [place] [address]  Glucose, capillary     Status: Abnormal   Collection Time: 11/13/18  4:36 AM  Result Value Ref Range   Glucose-Capillary 136 (H) 70 - 99 mg/dL  Glucose, capillary     Status: Abnormal   Collection Time: 11/13/18  7:44 AM  Result Value Ref Range   Glucose-Capillary 140 (H) 70 - 99 mg/dL  Glucose, capillary     Status: Abnormal   Collection Time: 11/13/18 10:03 AM  Result Value Ref Range   Glucose-Capillary 139 (H) 70 - 99 mg/dL  Glucose, capillary     Status: Abnormal   Collection Time: 11/13/18 11:43 AM  Result Value Ref Range   Glucose-Capillary 124 (H) 70 - 99 mg/dL  Glucose, capillary     Status: Abnormal   Collection Time: 11/13/18  4:49 PM  Result Value Ref Range   Glucose-Capillary 132 (H) 70 - 99 mg/dL  Glucose, capillary     Status: Abnormal   Collection Time: 11/13/18  8:52 PM  Result Value Ref Range   Glucose-Capillary 136 (H) 70 - 99 mg/dL  Glucose, capillary     Status: Abnormal   Collection Time: 11/14/18 12:16 AM  Result Value Ref Range   Glucose-Capillary 157 (H) 70 - 99 mg/dL  Glucose, capillary     Status: Abnormal   Collection Time: 11/14/18  4:41 AM  Result Value Ref Range   Glucose-Capillary 152 (H) 70 - 99 mg/dL  Magnesium     Status: None   Collection Time: 11/14/18  5:43 AM  Result Value Ref Range   Magnesium 1.9 1.7 - 2.4 mg/dL    Comment: Performed at [facility], [address] [place], [place] [address]  Phosphorus     Status: None   Collection Time: 11/14/18  5:43 AM  Result Value Ref Range   Phosphorus 3.9 2.5 - 4.6 mg/dL    Comment: Performed at [facility], [address] [place], [place] [address]  Comprehensive metabolic panel     Status: Abnormal   Collection Time: 11/14/18  5:43 AM  Result Value Ref Range   Sodium 138 135 - 145 mmol/L   Potassium 3.2 (L) 3.5 - 5.1 mmol/L   Chloride 108 98 -  111 mmol/L   CO2 24 22 - 32 mmol/L   Glucose, Bld 133 (H) 70 - 99 mg/dL   BUN 12 6 - 20 mg/dL   Creatinine, Ser 0.76 0.44 - 1.00 mg/dL   Calcium 8.1 (L) 8.9 - 10.3 mg/dL   Total Protein 5.9 (L) 6.5 - 8.1 g/dL   Albumin 2.6 (L) 3.5 - 5.0 g/dL   AST 27 15 - 41 U/L   ALT 25 0 - 44 U/L   Alkaline Phosphatase 29 (L) 38 - 126 U/L   Total Bilirubin 0.4 0.3 - 1.2 mg/dL   GFR calc non Af Amer >60 >60 mL/min   GFR calc Af Amer >60 >60 mL/min   Anion gap 6 5 - 15    Comment: Performed at [facility], [address] [place], [place] [address]  CBC     Status: Abnormal   Collection Time: 11/14/18  5:43 AM  Result Value Ref Range   WBC 7.1 4.0 - 10.5 K/uL   RBC 3.23 (L) 3.87 - 5.11 MIL/uL   Hemoglobin 8.4 (L) 12.0 - 15.0 g/dL   HCT 26.8 (L) 36.0 - 46.0 %   MCV 83.0 80.0 - 100.0 fL   MCH 26.0 26.0 - 34.0 pg   MCHC 31.3 30.0 - 36.0 g/dL   RDW 14.7 11.5 - 15.5 %   Platelets 319 150 - 400 K/uL   nRBC 0.6 (H) 0.0 - 0.2 %    Comment: Performed at [facility], [address] [place], [place] [address]  Glucose, capillary     Status: Abnormal   Collection Time: 11/14/18  7:39 AM  Result Value Ref Range   Glucose-Capillary 145 (H) 70 - 99 mg/dL  Aerobic/Anaerobic Culture (surgical/deep wound)     Status: None   Collection Time: 11/14/18  9:48 AM   Specimen: Incision  Result Value Ref Range   Specimen Description      INCISION Performed at [facility], [address] [place], [place] [address]    Special Requests      NONE Performed at [facility], [address] [place], [place] [address]    Gram Stain      RARE WBC PRESENT, PREDOMINANTLY PMN NO ORGANISMS SEEN    Culture      FEW ENTEROCOCCUS FAECALIS NO ANAEROBES ISOLATED Performed at [facility], [place] [address] [place], [place] [address]    Report Status 11/19/2018 FINAL    Organism ID, Bacteria ENTEROCOCCUS FAECALIS       Susceptibility   Enterococcus faecalis - MIC*     AMPICILLIN <=2 SENSITIVE Sensitive     VANCOMYCIN 2 SENSITIVE Sensitive     GENTAMICIN SYNERGY SENSITIVE Sensitive     * FEW ENTEROCOCCUS FAECALIS  Glucose, capillary     Status: Abnormal   Collection Time: 11/14/18 11:39 AM  Result Value Ref Range   Glucose-Capillary 119 (H) 70 - 99 mg/dL  Glucose, capillary     Status: Abnormal   Collection Time: 11/14/18  5:03 PM  Result Value Ref Range   Glucose-Capillary 131 (H) 70 - 99 mg/dL  Potassium     Status: None   Collection Time: 11/14/18  5:49 PM  Result Value Ref Range   Potassium 3.8 3.5 - 5.1 mmol/L    Comment: Performed at [facility], [address] [place], [place] [address]  Glucose, capillary     Status: Abnormal   Collection Time: 11/14/18  8:21 PM  Result Value Ref Range   Glucose-Capillary 118 (H) 70 - 99 mg/dL  Glucose, capillary     Status: Abnormal   Collection Time: 11/14/18 11:59 PM  Result Value Ref Range   Glucose-Capillary 156 (H) 70 - 99 mg/dL  Glucose, capillary     Status: Abnormal   Collection Time: 11/15/18  4:34 AM  Result Value Ref Range   Glucose-Capillary 134 (H) 70 - 99 mg/dL  Comprehensive metabolic panel     Status: Abnormal   Collection Time: 11/15/18  5:36 AM  Result Value Ref Range   Sodium 138 135 - 145 mmol/L   Potassium 3.5 3.5 - 5.1 mmol/L   Chloride 106 98 - 111 mmol/L   CO2 24 22 - 32 mmol/L   Glucose, Bld 133 (H) 70 -  99 mg/dL   BUN 11 6 - 20 mg/dL   Creatinine, Ser 0.59 0.44 - 1.00 mg/dL   Calcium 8.1 (L) 8.9 - 10.3 mg/dL   Total Protein 6.1 (L) 6.5 - 8.1 g/dL   Albumin 2.5 (L) 3.5 - 5.0 g/dL   AST 28 15 - 41 U/L   ALT 27 0 - 44 U/L   Alkaline Phosphatase 31 (L) 38 - 126 U/L   Total Bilirubin 0.8 0.3 - 1.2 mg/dL   GFR calc non Af Amer >60 >60 mL/min   GFR calc Af Amer >60 >60 mL/min   Anion gap 8 5 - 15    Comment: Performed at [facility], [address] [place], [place] [address]  Magnesium     Status: None   Collection Time: 11/15/18  5:36 AM   Result Value Ref Range   Magnesium 1.9 1.7 - 2.4 mg/dL    Comment: Performed at [facility], [address] [place], [place] [address]  Phosphorus     Status: None   Collection Time: 11/15/18  5:36 AM  Result Value Ref Range   Phosphorus 3.5 2.5 - 4.6 mg/dL    Comment: Performed at [facility], [address] [place], [place] [address]  CBC     Status: Abnormal   Collection Time: 11/15/18  5:36 AM  Result Value Ref Range   WBC 6.4 4.0 - 10.5 K/uL   RBC 3.28 (L) 3.87 - 5.11 MIL/uL   Hemoglobin 8.6 (L) 12.0 - 15.0 g/dL   HCT 26.8 (L) 36.0 - 46.0 %   MCV 81.7 80.0 - 100.0 fL   MCH 26.2 26.0 - 34.0 pg   MCHC 32.1 30.0 - 36.0 g/dL   RDW 14.5 11.5 - 15.5 %   Platelets 339 150 - 400 K/uL   nRBC 0.3 (H) 0.0 - 0.2 %    Comment: Performed at [facility], [address] [place], [place] [address]  Differential     Status: Abnormal   Collection Time: 11/15/18  5:36 AM  Result Value Ref Range   Neutrophils Relative % 59 %   Neutro Abs 3.9 1.7 - 7.7 K/uL   Lymphocytes Relative 20 %   Lymphs Abs 1.3 0.7 - 4.0 K/uL   Monocytes Relative 12 %   Monocytes Absolute 0.8 0.1 - 1.0 K/uL   Eosinophils Relative 3 %   Eosinophils Absolute 0.2 0.0 - 0.5 K/uL   Basophils Relative 1 %   Basophils Absolute 0.0 0.0 - 0.1 K/uL   WBC Morphology MORPHOLOGY UNREMARKABLE    RBC Morphology MORPHOLOGY UNREMARKABLE    Smear Review Normal platelet morphology    Immature Granulocytes 5 %   Abs Immature Granulocytes 0.32 (H) 0.00 - 0.07 K/uL    Comment: Performed at [facility], [address] [place],  [address]  Triglycerides     Status: None   Collection Time: 11/15/18  5:36 AM  Result Value Ref Range   Triglycerides 128 <150 mg/dL    Comment: Performed at [facility], [address] [place],  [address]  Prealbumin     Status: Abnormal   Collection Time: 11/15/18  5:36 AM  Result Value Ref Range   Prealbumin 17.8 (L) 18 - 38 mg/dL     Comment: Performed at [facility], [place] [address], [place], [place] [address]  Glucose, capillary     Status: Abnormal   Collection Time: 11/15/18  7:40 AM  Result Value  Ref Range   Glucose-Capillary 135 (H) 70 - 99 mg/dL   Comment 1 Notify RN   Aerobic/Anaerobic Culture (surgical/deep wound)     Status: None   Collection Time: 11/15/18  9:45 AM   Specimen: Abdomen; Wound  Result Value Ref Range   Specimen Description      ABDOMEN Performed at [facility], [address] [place], [place] [address]    Special Requests      Normal Performed at [facility], [place], [place] [address]    Gram Stain      FEW WBC PRESENT, PREDOMINANTLY PMN NO ORGANISMS SEEN    Culture      FEW ENTEROCOCCUS FAECALIS NO ANAEROBES ISOLATED Performed at [facility], [place] [address], [place], [place] [address]    Report Status 11/20/2018 FINAL    Organism ID, Bacteria ENTEROCOCCUS FAECALIS       Susceptibility   Enterococcus faecalis - MIC*    AMPICILLIN <=2 SENSITIVE Sensitive     VANCOMYCIN 2 SENSITIVE Sensitive     GENTAMICIN SYNERGY SENSITIVE Sensitive     * FEW ENTEROCOCCUS FAECALIS  Glucose, capillary     Status: Abnormal   Collection Time: 11/15/18 11:58 AM  Result Value Ref Range   Glucose-Capillary 134 (H) 70 - 99 mg/dL   Comment 1 Notify RN   Glucose, capillary     Status: Abnormal   Collection Time: 11/15/18  4:39 PM  Result Value Ref Range   Glucose-Capillary 123 (H) 70 - 99 mg/dL   Comment 1 Notify RN   Glucose, capillary     Status: Abnormal   Collection Time: 11/15/18  8:23 PM  Result Value Ref Range   Glucose-Capillary 138 (H) 70 - 99 mg/dL  Glucose, capillary     Status: Abnormal   Collection Time: 11/16/18  1:32 AM  Result Value Ref Range   Glucose-Capillary 156 (H) 70 - 99 mg/dL  Basic metabolic panel     Status: Abnormal   Collection Time: 11/16/18  5:19 AM  Result Value Ref Range   Sodium 137 135 - 145 mmol/L    Potassium 3.7 3.5 - 5.1 mmol/L   Chloride 106 98 - 111 mmol/L   CO2 26 22 - 32 mmol/L   Glucose, Bld 172 (H) 70 - 99 mg/dL   BUN 10 6 - 20 mg/dL   Creatinine, Ser 0.72 0.44 - 1.00 mg/dL   Calcium 8.2 (L) 8.9 - 10.3 mg/dL   GFR calc non Af Amer >60 >60 mL/min   GFR calc Af Amer >60 >60 mL/min   Anion gap 5 5 - 15    Comment: Performed at [facility], [address] [place], [place] [address]  Glucose, capillary     Status: Abnormal   Collection Time: 11/16/18  5:24 AM  Result Value Ref Range   Glucose-Capillary 179 (H) 70 - 99 mg/dL  Glucose, capillary     Status: Abnormal   Collection Time: 11/16/18  7:48 AM  Result Value Ref Range   Glucose-Capillary 133 (H) 70 - 99 mg/dL   Comment 1 Notify RN   Glucose, capillary     Status: Abnormal   Collection Time: 11/16/18 12:05 PM  Result Value Ref Range   Glucose-Capillary 120 (H) 70 - 99 mg/dL   Comment 1 Notify RN   Glucose, capillary     Status: Abnormal   Collection Time: 11/16/18  4:52 PM  Result Value Ref Range   Glucose-Capillary  133 (H) 70 - 99 mg/dL  Glucose, capillary     Status: Abnormal   Collection Time: 11/16/18  8:27 PM  Result Value Ref Range   Glucose-Capillary 128 (H) 70 - 99 mg/dL  Glucose, capillary     Status: Abnormal   Collection Time: 11/17/18 12:11 AM  Result Value Ref Range   Glucose-Capillary 133 (H) 70 - 99 mg/dL  Glucose, capillary     Status: Abnormal   Collection Time: 11/17/18  4:22 AM  Result Value Ref Range   Glucose-Capillary 150 (H) 70 - 99 mg/dL  Triglycerides     Status: Abnormal   Collection Time: 11/17/18  5:19 AM  Result Value Ref Range   Triglycerides 172 (H) <150 mg/dL    Comment: Performed at [facility], [address] [place], [place] [address]  Basic metabolic panel     Status: Abnormal   Collection Time: 11/17/18  5:19 AM  Result Value Ref Range   Sodium 139 135 - 145 mmol/L   Potassium 3.6 3.5 - 5.1 mmol/L   Chloride 106 98 - 111 mmol/L   CO2 26 22 -  32 mmol/L   Glucose, Bld 161 (H) 70 - 99 mg/dL   BUN 10 6 - 20 mg/dL   Creatinine, Ser 0.71 0.44 - 1.00 mg/dL   Calcium 8.2 (L) 8.9 - 10.3 mg/dL   GFR calc non Af Amer >60 >60 mL/min   GFR calc Af Amer >60 >60 mL/min   Anion gap 7 5 - 15    Comment: Performed at [facility], [address] [place], [place] [address]  Glucose, capillary     Status: Abnormal   Collection Time: 11/17/18  7:55 AM  Result Value Ref Range   Glucose-Capillary 149 (H) 70 - 99 mg/dL  Glucose, capillary     Status: Abnormal   Collection Time: 11/17/18 11:49 AM  Result Value Ref Range   Glucose-Capillary 138 (H) 70 - 99 mg/dL  Glucose, capillary     Status: Abnormal   Collection Time: 11/17/18  4:50 PM  Result Value Ref Range   Glucose-Capillary 139 (H) 70 - 99 mg/dL  Glucose, capillary     Status: Abnormal   Collection Time: 11/17/18  8:10 PM  Result Value Ref Range   Glucose-Capillary 145 (H) 70 - 99 mg/dL  Glucose, capillary     Status: Abnormal   Collection Time: 11/18/18 12:05 AM  Result Value Ref Range   Glucose-Capillary 153 (H) 70 - 99 mg/dL  Glucose, capillary     Status: Abnormal   Collection Time: 11/18/18  4:38 AM  Result Value Ref Range   Glucose-Capillary 173 (H) 70 - 99 mg/dL  Comprehensive metabolic panel     Status: Abnormal   Collection Time: 11/18/18  4:57 AM  Result Value Ref Range   Sodium 137 135 - 145 mmol/L   Potassium 3.9 3.5 - 5.1 mmol/L   Chloride 101 98 - 111 mmol/L   CO2 25 22 - 32 mmol/L   Glucose, Bld 168 (H) 70 - 99 mg/dL   BUN 10 6 - 20 mg/dL   Creatinine, Ser 0.59 0.44 - 1.00 mg/dL   Calcium 8.3 (L) 8.9 - 10.3 mg/dL   Total Protein 6.7 6.5 - 8.1 g/dL   Albumin 2.9 (L) 3.5 - 5.0 g/dL   AST 26 15 - 41 U/L   ALT 31 0 - 44 U/L   Alkaline Phosphatase 44 38 - 126 U/L   Total Bilirubin 0.6  0.3 - 1.2 mg/dL   GFR calc non Af Amer >60 >60 mL/min   GFR calc Af Amer >60 >60 mL/min   Anion gap 11 5 - 15    Comment: Performed at [facility], [address] [place], [place] [address]  Magnesium     Status: None   Collection Time: 11/18/18  4:57 AM  Result Value Ref Range   Magnesium 2.2 1.7 - 2.4 mg/dL    Comment: Performed at [facility], [address] [place], [place] [address]  Phosphorus     Status: None   Collection Time: 11/18/18  4:57 AM  Result Value Ref Range   Phosphorus 3.3 2.5 - 4.6 mg/dL    Comment: Performed at [facility], [address] [place], [place] [address]  Glucose, capillary     Status: Abnormal   Collection Time: 11/18/18  7:34 AM  Result Value Ref Range   Glucose-Capillary 168 (H) 70 - 99 mg/dL  Glucose, capillary     Status: Abnormal   Collection Time: 11/18/18 12:28 PM  Result Value Ref Range   Glucose-Capillary 155 (H) 70 - 99 mg/dL  Glucose, capillary     Status: Abnormal   Collection Time: 11/18/18  3:53 PM  Result Value Ref Range   Glucose-Capillary 108 (H) 70 - 99 mg/dL  Glucose, capillary     Status: Abnormal   Collection Time: 11/18/18  7:53 PM  Result Value Ref Range   Glucose-Capillary 142 (H) 70 - 99 mg/dL   Comment 1 Notify RN   Glucose, capillary     Status: Abnormal   Collection Time: 11/19/18 12:35 AM  Result Value Ref Range   Glucose-Capillary 165 (H) 70 - 99 mg/dL   Comment 1 Notify RN   Glucose, capillary     Status: Abnormal   Collection Time: 11/19/18  4:05 AM  Result Value Ref Range   Glucose-Capillary 138 (H) 70 - 99 mg/dL   Comment 1 Notify RN   Basic metabolic panel     Status: Abnormal   Collection Time: 11/19/18  6:59 AM  Result Value Ref Range   Sodium 140 135 - 145 mmol/L   Potassium 3.8 3.5 - 5.1 mmol/L   Chloride 104 98 - 111 mmol/L   CO2 26 22 - 32 mmol/L   Glucose, Bld 107 (H) 70 - 99 mg/dL   BUN 10 6 - 20 mg/dL   Creatinine, Ser 0.68 0.44 - 1.00 mg/dL   Calcium 8.3 (L) 8.9 - 10.3 mg/dL   GFR calc non Af Amer >60 >60 mL/min   GFR calc Af Amer >60 >60 mL/min   Anion gap 10 5 - 15    Comment: Performed at [facility], [address] [place], [place] [address]  CBC     Status: Abnormal   Collection Time: 11/19/18  6:59 AM  Result Value Ref Range   WBC 8.4 4.0 - 10.5 K/uL   RBC 3.50 (L) 3.87 - 5.11 MIL/uL   Hemoglobin 9.1 (L) 12.0 - 15.0 g/dL   HCT 28.7 (L) 36.0 - 46.0 %   MCV 82.0 80.0 - 100.0 fL   MCH 26.0 26.0 - 34.0 pg   MCHC 31.7 30.0 - 36.0 g/dL   RDW 14.5 11.5 - 15.5 %   Platelets 513 (H) 150 - 400 K/uL   nRBC 0.2 0.0 - 0.2 %    Comment: Performed at [facility], [address] [place], [place] [address]  Glucose, capillary     Status: Abnormal   Collection Time: 11/19/18  7:43 AM  Result Value Ref Range   Glucose-Capillary 114 (H) 70 - 99 mg/dL  Glucose, capillary     Status: Abnormal   Collection Time: 11/19/18 11:44 AM  Result Value Ref Range   Glucose-Capillary 132 (H) 70 - 99 mg/dL  Glucose, capillary     Status: None   Collection Time: 11/19/18  5:12 PM  Result Value Ref Range   Glucose-Capillary 86 70 - 99 mg/dL  Glucose, capillary     Status: Abnormal   Collection Time: 11/19/18  8:08 PM  Result Value Ref Range   Glucose-Capillary 127 (H) 70 - 99 mg/dL  Glucose, capillary     Status: Abnormal   Collection Time: 11/20/18 12:10 AM  Result Value Ref Range   Glucose-Capillary 155 (H) 70 - 99 mg/dL  Glucose, capillary     Status: Abnormal   Collection Time: 11/20/18  3:49 AM  Result Value Ref Range   Glucose-Capillary 144 (H) 70 - 99 mg/dL  Phosphorus     Status: None   Collection Time: 11/20/18  5:06 AM  Result Value Ref Range   Phosphorus 4.6 2.5 - 4.6 mg/dL    Comment: Performed at [facility], [address] [place], [place] [address]  Magnesium     Status: None   Collection Time: 11/20/18  5:06 AM  Result Value Ref Range   Magnesium 2.2 1.7 - 2.4 mg/dL    Comment: Performed at [facility], [address] [place], [place] [address]  Basic metabolic panel     Status: Abnormal   Collection Time: 11/20/18  5:06 AM  Result Value  Ref Range   Sodium 139 135 - 145 mmol/L   Potassium 3.9 3.5 - 5.1 mmol/L   Chloride 104 98 - 111 mmol/L   CO2 27 22 - 32 mmol/L   Glucose, Bld 107 (H) 70 - 99 mg/dL   BUN 12 6 - 20 mg/dL   Creatinine, Ser 0.64 0.44 - 1.00 mg/dL   Calcium 8.4 (L) 8.9 - 10.3 mg/dL   GFR calc non Af Amer >60 >60 mL/min   GFR calc Af Amer >60 >60 mL/min   Anion gap 8 5 - 15    Comment: Performed at [facility], [address] [place], [place] [address]  Glucose, capillary     Status: Abnormal   Collection Time: 11/20/18  8:03 AM  Result Value Ref Range   Glucose-Capillary 135 (H) 70 - 99 mg/dL   Comment 1 Notify RN   Glucose, capillary     Status: None   Collection Time: 11/20/18 12:32 PM  Result Value Ref Range   Glucose-Capillary 88 70 - 99 mg/dL   Comment 1 Notify RN   Glucose, capillary     Status: Abnormal   Collection Time: 11/20/18  4:35 PM  Result Value Ref Range   Glucose-Capillary 125 (H) 70 - 99 mg/dL   Comment 1 Notify RN   Glucose, capillary     Status: None   Collection Time: 11/20/18  7:49 PM  Result Value Ref Range   Glucose-Capillary 87 70 - 99 mg/dL  Glucose, capillary     Status: Abnormal   Collection Time: 11/20/18 11:59 PM  Result Value Ref Range   Glucose-Capillary 141 (H) 70 - 99 mg/dL  Glucose, capillary     Status: Abnormal   Collection Time: 11/21/18  3:35 AM  Result Value Ref  Range   Glucose-Capillary 151 (H) 70 - 99 mg/dL  Glucose, capillary     Status: Abnormal   Collection Time: 11/21/18  8:01 AM  Result Value Ref Range   Glucose-Capillary 139 (H) 70 - 99 mg/dL  Glucose, capillary     Status: Abnormal   Collection Time: 11/21/18 12:00 PM  Result Value Ref Range   Glucose-Capillary 129 (H) 70 - 99 mg/dL  Glucose, capillary     Status: Abnormal   Collection Time: 11/21/18  4:51 PM  Result Value Ref Range   Glucose-Capillary 130 (H) 70 - 99 mg/dL  Glucose, capillary     Status: Abnormal   Collection Time: 11/21/18  8:03 PM  Result Value Ref  Range   Glucose-Capillary 131 (H) 70 - 99 mg/dL  Glucose, capillary     Status: Abnormal   Collection Time: 11/21/18 11:52 PM  Result Value Ref Range   Glucose-Capillary 128 (H) 70 - 99 mg/dL  Glucose, capillary     Status: Abnormal   Collection Time: 11/22/18  5:11 AM  Result Value Ref Range   Glucose-Capillary 129 (H) 70 - 99 mg/dL  Comprehensive metabolic panel     Status: Abnormal   Collection Time: 11/22/18  5:39 AM  Result Value Ref Range   Sodium 137 135 - 145 mmol/L   Potassium 3.7 3.5 - 5.1 mmol/L   Chloride 104 98 - 111 mmol/L   CO2 26 22 - 32 mmol/L   Glucose, Bld 115 (H) 70 - 99 mg/dL   BUN 13 6 - 20 mg/dL   Creatinine, Ser 0.70 0.44 - 1.00 mg/dL   Calcium 8.7 (L) 8.9 - 10.3 mg/dL   Total Protein 7.3 6.5 - 8.1 g/dL   Albumin 3.0 (L) 3.5 - 5.0 g/dL   AST 33 15 - 41 U/L   ALT 48 (H) 0 - 44 U/L   Alkaline Phosphatase 55 38 - 126 U/L   Total Bilirubin 0.5 0.3 - 1.2 mg/dL   GFR calc non Af Amer >60 >60 mL/min   GFR calc Af Amer >60 >60 mL/min   Anion gap 7 5 - 15    Comment: Performed at [facility], [address] [place], [place] [address]  Magnesium     Status: None   Collection Time: 11/22/18  5:39 AM  Result Value Ref Range   Magnesium 2.0 1.7 - 2.4 mg/dL    Comment: Performed at [facility], [address] [place], [place] [address]  Phosphorus     Status: None   Collection Time: 11/22/18  5:39 AM  Result Value Ref Range   Phosphorus 4.2 2.5 - 4.6 mg/dL    Comment: Performed at [facility], [address] [place], [place] [address]  CBC     Status: Abnormal   Collection Time: 11/22/18  5:39 AM  Result Value Ref Range   WBC 6.6 4.0 - 10.5 K/uL   RBC 3.61 (L) 3.87 - 5.11 MIL/uL   Hemoglobin 9.3 (L) 12.0 - 15.0 g/dL   HCT 29.6 (L) 36.0 - 46.0 %   MCV 82.0 80.0 - 100.0 fL   MCH 25.8 (L) 26.0 - 34.0 pg   MCHC 31.4 30.0 - 36.0 g/dL   RDW 14.6 11.5 - 15.5 %   Platelets 532 (H) 150 - 400 K/uL   nRBC 0.0 0.0 - 0.2 %     Comment: Performed at [facility], [address], [place], [place] [address]  Differential  Status: None   Collection Time: 11/22/18  5:39 AM  Result Value Ref Range   Neutrophils Relative % 61 %   Neutro Abs 4.0 1.7 - 7.7 K/uL   Lymphocytes Relative 23 %   Lymphs Abs 1.5 0.7 - 4.0 K/uL   Monocytes Relative 14 %   Monocytes Absolute 0.9 0.1 - 1.0 K/uL   Eosinophils Relative 1 %   Eosinophils Absolute 0.1 0.0 - 0.5 K/uL   Basophils Relative 0 %   Basophils Absolute 0.0 0.0 - 0.1 K/uL   Immature Granulocytes 1 %   Abs Immature Granulocytes 0.07 0.00 - 0.07 K/uL    Comment: Performed at [facility], [address] [place], [place] [address]  Triglycerides     Status: None   Collection Time: 11/22/18  5:39 AM  Result Value Ref Range   Triglycerides 140 <150 mg/dL    Comment: Performed at [facility], [address] [place], [place] [address]  Prealbumin     Status: None   Collection Time: 11/22/18  5:39 AM  Result Value Ref Range   Prealbumin 25.1 18 - 38 mg/dL    Comment: Performed at [facility] [address] [place], [place] [address]  Glucose, capillary     Status: Abnormal   Collection Time: 11/22/18 11:53 AM  Result Value Ref Range   Glucose-Capillary 152 (H) 70 - 99 mg/dL  Glucose, capillary     Status: Abnormal   Collection Time: 11/22/18 11:55 PM  Result Value Ref Range   Glucose-Capillary 156 (H) 70 - 99 mg/dL  Glucose, capillary     Status: Abnormal   Collection Time: 11/23/18  5:14 AM  Result Value Ref Range   Glucose-Capillary 139 (H) 70 - 99 mg/dL  Creatinine, serum     Status: None   Collection Time: 11/23/18  7:00 AM  Result Value Ref Range   Creatinine, Ser 0.68 0.44 - 1.00 mg/dL   GFR calc non Af Amer >60 >60 mL/min   GFR calc Af Amer >60 >60 mL/min    Comment: Performed at [facility], [address] [place], [place] [address]  Glucose, capillary     Status: Abnormal   Collection Time: 11/23/18   7:51 AM  Result Value Ref Range   Glucose-Capillary 137 (H) 70 - 99 mg/dL  Glucose, capillary     Status: Abnormal   Collection Time: 11/23/18 12:03 PM  Result Value Ref Range   Glucose-Capillary 116 (H) 70 - 99 mg/dL  Glucose, capillary     Status: Abnormal   Collection Time: 11/23/18  4:43 PM  Result Value Ref Range   Glucose-Capillary 111 (H) 70 - 99 mg/dL  Glucose, capillary     Status: Abnormal   Collection Time: 11/23/18 11:37 PM  Result Value Ref Range   Glucose-Capillary 130 (H) 70 - 99 mg/dL  Glucose, capillary     Status: Abnormal   Collection Time: 11/24/18  4:54 AM  Result Value Ref Range   Glucose-Capillary 135 (H) 70 - 99 mg/dL  Glucose, capillary     Status: Abnormal   Collection Time: 11/24/18 11:48 AM  Result Value Ref Range   Glucose-Capillary 119 (H) 70 - 99 mg/dL   Comment 1 Notify RN   Glucose, capillary     Status: Abnormal   Collection Time: 11/24/18  4:51 PM  Result Value Ref Range   Glucose-Capillary 112 (H) 70 - 99 mg/dL   Comment 1 Notify RN   Glucose,  capillary     Status: Abnormal   Collection Time: 11/24/18 11:47 PM  Result Value Ref Range   Glucose-Capillary 137 (H) 70 - 99 mg/dL  Glucose, capillary     Status: Abnormal   Collection Time: 11/25/18  5:19 AM  Result Value Ref Range   Glucose-Capillary 134 (H) 70 - 99 mg/dL   Comment 1 Notify RN   Comprehensive metabolic panel     Status: Abnormal   Collection Time: 11/25/18  6:14 AM  Result Value Ref Range   Sodium 138 135 - 145 mmol/L   Potassium 3.9 3.5 - 5.1 mmol/L   Chloride 104 98 - 111 mmol/L   CO2 26 22 - 32 mmol/L   Glucose, Bld 109 (H) 70 - 99 mg/dL   BUN 13 6 - 20 mg/dL   Creatinine, Ser 0.68 0.44 - 1.00 mg/dL   Calcium 8.5 (L) 8.9 - 10.3 mg/dL   Total Protein 7.0 6.5 - 8.1 g/dL   Albumin 2.9 (L) 3.5 - 5.0 g/dL   AST 29 15 - 41 U/L   ALT 46 (H) 0 - 44 U/L   Alkaline Phosphatase 52 38 - 126 U/L   Total Bilirubin 0.5 0.3 - 1.2 mg/dL   GFR calc non Af Amer >60 >60 mL/min    GFR calc Af Amer >60 >60 mL/min   Anion gap 8 5 - 15    Comment: Performed at [facility], [address], [place], [place] [address]  Magnesium     Status: None   Collection Time: 11/25/18  6:14 AM  Result Value Ref Range   Magnesium 1.9 1.7 - 2.4 mg/dL    Comment: Performed at [facility], [address] [place], [place] [address]  Phosphorus     Status: Abnormal   Collection Time: 11/25/18  6:14 AM  Result Value Ref Range   Phosphorus 4.7 (H) 2.5 - 4.6 mg/dL    Comment: Performed at [facility], [address] [place], [place] [address]  Glucose, capillary     Status: Abnormal   Collection Time: 11/25/18  7:43 AM  Result Value Ref Range   Glucose-Capillary 130 (H) 70 - 99 mg/dL  Glucose, capillary     Status: Abnormal   Collection Time: 11/25/18 11:46 AM  Result Value Ref Range   Glucose-Capillary 119 (H) 70 - 99 mg/dL  Glucose, capillary     Status: Abnormal   Collection Time: 11/25/18  5:10 PM  Result Value Ref Range   Glucose-Capillary 113 (H) 70 - 99 mg/dL  Glucose, capillary     Status: Abnormal   Collection Time: 11/25/18 11:30 PM  Result Value Ref Range   Glucose-Capillary 125 (H) 70 - 99 mg/dL  Glucose, capillary     Status: Abnormal   Collection Time: 11/26/18  6:11 AM  Result Value Ref Range   Glucose-Capillary 109 (H) 70 - 99 mg/dL  Glucose, capillary     Status: None   Collection Time: 11/26/18 12:16 PM  Result Value Ref Range   Glucose-Capillary 91 70 - 99 mg/dL   Comment 1 Notify RN   Glucose, capillary     Status: None   Collection Time: 11/26/18  5:57 PM  Result Value Ref Range   Glucose-Capillary 91 70 - 99 mg/dL   Comment 1 Notify RN   Glucose, capillary     Status: None   Collection Time: 11/27/18 12:00 AM  Result Value Ref Range   Glucose-Capillary 99 70 - 99 mg/dL  Glucose, capillary     Status: None   Collection Time: 11/27/18  6:03 AM  Result Value Ref Range   Glucose-Capillary 93 70 - 99 mg/dL   Comment 1  Notify RN   Glucose, capillary     Status: Abnormal   Collection Time: 11/27/18 11:38 AM  Result Value Ref Range   Glucose-Capillary 109 (H) 70 - 99 mg/dL   Comment 1 Notify RN     GAD 7 : Generalized Anxiety Score 01/11/2019 12/31/2018 11/05/2017 03/28/2015  Nervous, Anxious, on Edge 1 2 0 3  Control/stop worrying _0 Worry too much - different things 3 2 0 3  Trouble relaxing 0 3 0 3  Restless 0 1 0 3  Easily annoyed or irritable 1 1 0 3  Afraid - awful might happen 3 2 0 3  Total GAD 7 Score _1 Anxiety Difficulty Very difficult Not difficult at all Not difficult at all Extremely difficult     PHQ2/9: Depression screen [facility] 2/9 01/11/2019 05/07/2018 11/05/2017 02/10/2017 04/25/2015  Decreased Interest 3 0 0 0 0  Down, Depressed, Hopeless 0 0 0 0 0  PHQ - 2 Score 3 0 0 0 0  Altered sleeping 3 0 3 - -  Tired, decreased energy 3 0 1 - -  Change in appetite 0 0 0 - -  Feeling bad or failure about yourself  0 0 0 - -  Trouble concentrating 2 0 0 - -  Moving slowly or fidgety/restless 0 0 0 - -  Suicidal thoughts 0 0 0 - -  PHQ-9 Score 11 0 4 - -  Difficult doing work/chores Somewhat difficult Not difficult at all Not difficult at all - -    phq 9 is negative   Fall Risk: Fall Risk  01/11/2019 12/02/2018 12/02/2018 05/07/2018 11/05/2017  Falls in the past year? 0 0 0 0 No  Number falls in past yr: 0 - - - -  Injury with Fall? 0 - - - -    Functional Status Survey: Is the patient deaf or have difficulty hearing?: No Does the patient have difficulty seeing, even when wearing glasses/contacts?: No Does the patient have difficulty concentrating, remembering, or making decisions?: No Does the patient have difficulty walking or climbing stairs?: No Does the patient have difficulty dressing or bathing?: No Does the patient have difficulty doing errands alone such as visiting a doctor's office or shopping?: No    Assessment & Plan  1. Prediabetes  - Hemoglobin  A1c  2. Benign hypertension  - COMPLETE METABOLIC PANEL WITH GFR  3. Mild major depression (HCC)  On lexapro now, given by gyn   4. Iron deficiency anemia due to chronic blood loss  - CBC with Differential/Platelet - Iron, TIBC and Ferritin Panel  5. Dyslipidemia  - Lipid panel  6. History of hysterectomy for benign disease   7. Intermittent constipation  - polyethylene glycol powder (GLYCOLAX/MIRALAX) 17 GM/SCOOP powder; Take 17 g by mouth daily.  Dispense: 3350 g; Refill: 1  8. Mild protein-calorie malnutrition ([place])  During hospital stay albumin went down to below 3 we will recheck labs   9. Insomnia, unspecified type  - QUEtiapine (SEROQUEL) 25 MG tablet; Take 1 tablet (25 mg total) by mouth at bedtime.  Dispense: 30 tablet; Refill: 0

## 2019-01-12 LAB — COMPLETE METABOLIC PANEL WITH GFR
AG Ratio: 1.1 (calc) (ref 1.0–2.5)
ALT: 15 U/L (ref 6–29)
AST: 16 U/L (ref 10–35)
Albumin: 4.2 g/dL (ref 3.6–5.1)
Alkaline phosphatase (APISO): 53 U/L (ref 37–153)
BUN: 12 mg/dL (ref 7–25)
CO2: 30 mmol/L (ref 20–32)
Calcium: 9.7 mg/dL (ref 8.6–10.4)
Chloride: 104 mmol/L (ref 98–110)
Creat: 0.81 mg/dL (ref 0.50–1.05)
GFR, Est African American: 96 mL/min/{1.73_m2} (ref >=60)
GFR, Est Non African American: 83 mL/min/{1.73_m2} (ref >=60)
Globulin: 3.7 g/dL (calc) (ref 1.9–3.7)
Glucose, Bld: 96 mg/dL (ref 65–99)
Potassium: 4.3 mmol/L (ref 3.5–5.3)
Sodium: 141 mmol/L (ref 135–146)
Total Bilirubin: 0.3 mg/dL (ref 0.2–1.2)
Total Protein: 7.9 g/dL (ref 6.1–8.1)

## 2019-01-12 LAB — IRON,TIBC AND FERRITIN PANEL
%SAT: 14 % (calc) — ABNORMAL LOW (ref 16–45)
Ferritin: 23 ng/mL (ref 16–232)
Iron: 52 ug/dL (ref 45–160)
TIBC: 383 mcg/dL (calc) (ref 250–450)

## 2019-01-12 LAB — CBC WITH DIFFERENTIAL/PLATELET
Absolute Monocytes: 400 cells/uL (ref 200–950)
Basophils Absolute: 0 cells/uL (ref 0–200)
Basophils Relative: 0 %
Eosinophils Absolute: 118 cells/uL (ref 15–500)
Eosinophils Relative: 3.2 %
HCT: 37 % (ref 35.0–45.0)
Hemoglobin: 11.3 g/dL — ABNORMAL LOW (ref 11.7–15.5)
Lymphs Abs: 1906 cells/uL (ref 850–3900)
MCH: 23.8 pg — ABNORMAL LOW (ref 27.0–33.0)
MCHC: 30.5 g/dL — ABNORMAL LOW (ref 32.0–36.0)
MCV: 77.9 fL — ABNORMAL LOW (ref 80.0–100.0)
MPV: 10.1 fL (ref 7.5–12.5)
Monocytes Relative: 10.8 %
Neutro Abs: 1277 cells/uL — ABNORMAL LOW (ref 1500–7800)
Neutrophils Relative %: 34.5 %
Platelets: 376 10*3/uL (ref 140–400)
RBC: 4.75 10*6/uL (ref 3.80–5.10)
RDW: 14.5 % (ref 11.0–15.0)
Total Lymphocyte: 51.5 %
WBC: 3.7 10*3/uL — ABNORMAL LOW (ref 3.8–10.8)

## 2019-01-12 LAB — LIPID PANEL
Cholesterol: 256 mg/dL — ABNORMAL HIGH (ref <200)
HDL: 55 mg/dL (ref >=50)
LDL Cholesterol (Calc): 183 mg/dL (calc) — ABNORMAL HIGH
Non-HDL Cholesterol (Calc): 201 mg/dL (calc) — ABNORMAL HIGH (ref <130)
Total CHOL/HDL Ratio: 4.7 (calc) (ref <5.0)
Triglycerides: 75 mg/dL (ref <150)

## 2019-01-12 LAB — HEMOGLOBIN A1C
Hgb A1c MFr Bld: 6.1 % of total Hgb — ABNORMAL HIGH (ref <5.7)
Mean Plasma Glucose: 128 (calc)
eAG (mmol/L): 7.1 (calc)

## 2019-01-14 ENCOUNTER — Encounter: Payer: Self-pay | Admitting: Family Medicine

## 2019-01-14 ENCOUNTER — Other Ambulatory Visit: Payer: Self-pay | Admitting: Family Medicine

## 2019-01-14 MED ORDER — ROSUVASTATIN CALCIUM 20 MG PO TABS: 20.0000 mg | ORAL_TABLET | Freq: Every day | ORAL | 0 refills | Status: DC

## 2019-02-01 ENCOUNTER — Other Ambulatory Visit: Payer: Self-pay

## 2019-02-01 ENCOUNTER — Encounter: Payer: Self-pay | Admitting: Obstetrics and Gynecology

## 2019-02-01 ENCOUNTER — Ambulatory Visit (INDEPENDENT_AMBULATORY_CARE_PROVIDER_SITE_OTHER): Payer: BC Managed Care – PPO | Admitting: Obstetrics and Gynecology

## 2019-02-01 VITALS — BP 120/70 | Temp 96.3°F | Ht 67.5 in | Wt 205.0 lb

## 2019-02-01 DIAGNOSIS — F419 Anxiety disorder, unspecified: Secondary | ICD-10-CM

## 2019-02-01 DIAGNOSIS — Z90710 Acquired absence of both cervix and uterus: Secondary | ICD-10-CM

## 2019-02-01 DIAGNOSIS — Z4889 Encounter for other specified surgical aftercare: Secondary | ICD-10-CM

## 2019-02-01 MED ORDER — ESCITALOPRAM OXALATE 10 MG PO TABS: 10.0000 mg | ORAL_TABLET | Freq: Every day | ORAL | 11 refills | Status: DC

## 2019-02-01 NOTE — Progress Notes (Signed)
  Postoperative Follow-up Patient presents post op fromtotal abdominal hysterectomycomplicated by bowel injury which was repaired with with partial sigmoid colectomy with a primary stapled anastomosis as well as primary repair of the small bowel injury for abnormal uterine bleeding and fibroids, 12 weeks ago.  Subjective: Patient reports marked improvement in her preop symptoms. Eating a regular diet without difficulty. Pain is controlled without any medications.  Activity: normal activities of daily living. Patient reports additional symptom's since surgery of None.  Objective: BP 120/70   Temp (!) 96.3 F (35.7 C)   Ht 5' 7.5" (1.715 m)   Wt 205 lb (93 kg)   LMP 10/26/2018   BMI 31.63 kg/m  Physical Exam Constitutional:      Appearance: She is well-developed.  Genitourinary:     Vagina and uterus normal.     No lesions in the vagina.     No cervical motion tenderness.     No right or left adnexal mass present.     Genitourinary Comments: Well healed vaginal cuff. No bleeding. No granulation tissue. Palpated normal and intact on bimanual exam.  HENT:     Head: Normocephalic and atraumatic.  Neck:     Musculoskeletal: Neck supple.     Thyroid: No thyromegaly.  Cardiovascular:     Rate and Rhythm: Normal rate and regular rhythm.     Heart sounds: Normal heart sounds.  Pulmonary:     Effort: Pulmonary effort is normal.     Breath sounds: Normal breath sounds.  Chest:     Breasts:        Right: No inverted nipple, mass, nipple discharge or skin change.        Left: No inverted nipple, mass, nipple discharge or skin change.  Abdominal:     General: Bowel sounds are normal. There is no distension.     Palpations: Abdomen is soft. There is no mass.     Comments: Midline abdominal incision is well healed.   Neurological:     Mental Status: She is alert and oriented to person, place, and time.  Skin:    General: Skin is warm and dry.  Psychiatric:        Behavior: Behavior  normal.        Thought Content: Thought content normal.        Judgment: Judgment normal.  Vitals signs reviewed.     Assessment: s/p :  total abdominal hysterectomycomplicated by bowel injury which was repaired with with partial sigmoid colectomy with a primary stapled anastomosis as well as primary repair of the small bowel injury for abnormal uterine bleeding and fibroids,  stable  Plan: Patient has done well after surgery with no apparent complications.  I have discussed the post-operative course to date, and the expected progress moving forward.  The patient understands what complications to be concerned about.  I will see the patient in routine follow up, or sooner if needed.    GAD-7: 5 PHQ-9: 5  Significant improvement in anxiety and depression scores. Continue Lexapro 10mg  Discussed Lipid panel and encouraged follow up.   Activity plan: No restriction.  Pelvic rest.  Catherine Christensen 02/01/2019, 2:13 PM

## 2019-02-05 ENCOUNTER — Other Ambulatory Visit: Payer: Self-pay | Admitting: Family Medicine

## 2019-02-05 DIAGNOSIS — G4700 Insomnia, unspecified: Secondary | ICD-10-CM

## 2019-02-06 NOTE — Telephone Encounter (Signed)
Requested medication (s) are due for refill today: yes  Requested medication (s) are on the active medication list:yes  Last refill:  01/11/2019  Future visit scheduled: yes  Notes to clinic:  refill cannot be delegated Requesting 90 day supply   Requested Prescriptions  Pending Prescriptions Disp Refills   QUEtiapine (SEROQUEL) 25 MG tablet [Pharmacy Med Name: QUETIAPINE FUMARATE 25 MG TAB] 90 tablet 1    Sig: TAKE 1 TABLET BY MOUTH EVERYDAY AT BEDTIME      Not Delegated - Psychiatry:  Antipsychotics - Second Generation (Atypical) - quetiapine Failed - 02/05/2019 11:36 AM      Failed - This refill cannot be delegated      Passed - ALT in normal range and within 180 days    ALT  Date Value Ref Range Status  01/11/2019 15 6 - 29 U/L Final          Passed - AST in normal range and within 180 days    AST  Date Value Ref Range Status  01/11/2019 16 10 - 35 U/L Final          Passed - Completed PHQ-2 or PHQ-9 in the last 360 days.      Passed - Last BP in normal range    BP Readings from Last 1 Encounters:  02/01/19 120/70          Passed - Valid encounter within last 6 months    Recent Outpatient Visits           3 weeks ago Prediabetes   [facility] Steele Sizer, MD   9 months ago Benign hypertension   [place], [place]   1 year ago Major depression in complete remission [facility])   [facility] Steele Sizer, MD   1 year ago Menorrhagia with regular cycle   [place], [place], NP   1 year ago Well woman exam   [facility] Steele Sizer, MD       Future Appointments             In 1 month Steele Sizer, MD [facility], [facility]

## 2019-02-15 ENCOUNTER — Ambulatory Visit
Admission: RE | Admit: 2019-02-15 | Discharge: 2019-02-15 | Disposition: A | Discharge status: Home / Self Care | Payer: BC Managed Care – PPO | Source: Ambulatory Visit | Attending: Obstetrics and Gynecology | Admitting: Obstetrics and Gynecology

## 2019-02-15 DIAGNOSIS — N63 Unspecified lump in unspecified breast: Secondary | ICD-10-CM

## 2019-02-15 DIAGNOSIS — Z1231 Encounter for screening mammogram for malignant neoplasm of breast: Secondary | ICD-10-CM

## 2019-02-15 DIAGNOSIS — N6311 Unspecified lump in the right breast, upper outer quadrant: Secondary | ICD-10-CM | POA: Diagnosis not present

## 2019-02-15 DIAGNOSIS — R928 Other abnormal and inconclusive findings on diagnostic imaging of breast: Secondary | ICD-10-CM | POA: Diagnosis not present

## 2019-02-16 ENCOUNTER — Other Ambulatory Visit: Payer: Self-pay | Admitting: Obstetrics and Gynecology

## 2019-02-16 DIAGNOSIS — N6310 Unspecified lump in the right breast, unspecified quadrant: Secondary | ICD-10-CM

## 2019-02-16 DIAGNOSIS — R928 Other abnormal and inconclusive findings on diagnostic imaging of breast: Secondary | ICD-10-CM

## 2019-02-24 ENCOUNTER — Other Ambulatory Visit: Payer: Self-pay | Admitting: Obstetrics and Gynecology

## 2019-02-24 DIAGNOSIS — I10 Essential (primary) hypertension: Secondary | ICD-10-CM

## 2019-02-28 NOTE — Telephone Encounter (Signed)
Please advise. Thank you

## 2019-03-01 ENCOUNTER — Ambulatory Visit
Admission: RE | Admit: 2019-03-01 | Discharge: 2019-03-01 | Disposition: A | Discharge status: Home / Self Care | Payer: BC Managed Care – PPO | Source: Ambulatory Visit | Attending: Obstetrics and Gynecology | Admitting: Obstetrics and Gynecology

## 2019-03-01 DIAGNOSIS — R928 Other abnormal and inconclusive findings on diagnostic imaging of breast: Secondary | ICD-10-CM | POA: Diagnosis not present

## 2019-03-01 DIAGNOSIS — N6310 Unspecified lump in the right breast, unspecified quadrant: Secondary | ICD-10-CM | POA: Diagnosis not present

## 2019-03-01 DIAGNOSIS — R590 Localized enlarged lymph nodes: Secondary | ICD-10-CM | POA: Diagnosis not present

## 2019-03-01 DIAGNOSIS — N6311 Unspecified lump in the right breast, upper outer quadrant: Secondary | ICD-10-CM | POA: Diagnosis not present

## 2019-03-03 LAB — SURGICAL PATHOLOGY

## 2019-03-15 DIAGNOSIS — N6310 Unspecified lump in the right breast, unspecified quadrant: Secondary | ICD-10-CM | POA: Diagnosis not present

## 2019-03-27 ENCOUNTER — Other Ambulatory Visit: Payer: Self-pay | Admitting: Family Medicine

## 2019-03-27 DIAGNOSIS — I10 Essential (primary) hypertension: Secondary | ICD-10-CM

## 2019-03-29 ENCOUNTER — Ambulatory Visit: Payer: BC Managed Care – PPO | Admitting: Family Medicine

## 2019-04-05 ENCOUNTER — Ambulatory Visit: Payer: BC Managed Care – PPO | Admitting: Family Medicine

## 2019-04-05 ENCOUNTER — Other Ambulatory Visit: Payer: Self-pay

## 2019-04-05 ENCOUNTER — Encounter: Payer: Self-pay | Admitting: Family Medicine

## 2019-04-05 VITALS — BP 110/70 | HR 79 | Temp 96.8°F | Resp 16 | Ht 67.5 in | Wt 205.5 lb

## 2019-04-05 DIAGNOSIS — R7303 Prediabetes: Secondary | ICD-10-CM

## 2019-04-05 DIAGNOSIS — D500 Iron deficiency anemia secondary to blood loss (chronic): Secondary | ICD-10-CM

## 2019-04-05 DIAGNOSIS — I10 Essential (primary) hypertension: Secondary | ICD-10-CM | POA: Diagnosis not present

## 2019-04-05 DIAGNOSIS — D649 Anemia, unspecified: Secondary | ICD-10-CM | POA: Diagnosis not present

## 2019-04-05 DIAGNOSIS — D72819 Decreased white blood cell count, unspecified: Secondary | ICD-10-CM

## 2019-04-05 DIAGNOSIS — R7309 Other abnormal glucose: Secondary | ICD-10-CM

## 2019-04-05 DIAGNOSIS — E669 Obesity, unspecified: Secondary | ICD-10-CM

## 2019-04-05 DIAGNOSIS — E785 Hyperlipidemia, unspecified: Secondary | ICD-10-CM | POA: Diagnosis not present

## 2019-04-05 DIAGNOSIS — G4700 Insomnia, unspecified: Secondary | ICD-10-CM

## 2019-04-05 DIAGNOSIS — F419 Anxiety disorder, unspecified: Secondary | ICD-10-CM

## 2019-04-05 DIAGNOSIS — F3340 Major depressive disorder, recurrent, in remission, unspecified: Secondary | ICD-10-CM | POA: Diagnosis not present

## 2019-04-05 MED ORDER — ROSUVASTATIN CALCIUM 20 MG PO TABS: 20.0000 mg | ORAL_TABLET | Freq: Every day | ORAL | 1 refills | Status: DC

## 2019-04-05 MED ORDER — HYDROXYZINE HCL 10 MG PO TABS: 10.0000 mg | ORAL_TABLET | Freq: Every day | ORAL | 0 refills | Status: DC

## 2019-04-05 MED ORDER — ESCITALOPRAM OXALATE 10 MG PO TABS: 10.0000 mg | ORAL_TABLET | Freq: Every day | ORAL | 1 refills | Status: DC
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
## 2019-04-06 ENCOUNTER — Encounter: Payer: Self-pay | Admitting: Family Medicine

## 2019-04-08 LAB — COMPLETE METABOLIC PANEL WITH GFR
AG Ratio: 1.3 (calc) (ref 1.0–2.5)
ALT: 15 U/L (ref 6–29)
AST: 16 U/L (ref 10–35)
Albumin: 4.1 g/dL (ref 3.6–5.1)
Alkaline phosphatase (APISO): 53 U/L (ref 37–153)
BUN: 14 mg/dL (ref 7–25)
CO2: 28 mmol/L (ref 20–32)
Calcium: 9.2 mg/dL (ref 8.6–10.4)
Chloride: 106 mmol/L (ref 98–110)
Creat: 0.74 mg/dL (ref 0.50–1.05)
GFR, Est African American: 107 mL/min/{1.73_m2} (ref >=60)
GFR, Est Non African American: 92 mL/min/{1.73_m2} (ref >=60)
Globulin: 3.2 g/dL (calc) (ref 1.9–3.7)
Glucose, Bld: 85 mg/dL (ref 65–99)
Potassium: 4.6 mmol/L (ref 3.5–5.3)
Sodium: 141 mmol/L (ref 135–146)
Total Bilirubin: 0.3 mg/dL (ref 0.2–1.2)
Total Protein: 7.3 g/dL (ref 6.1–8.1)

## 2019-04-08 LAB — LIPID PANEL
Cholesterol: 147 mg/dL (ref <200)
HDL: 51 mg/dL (ref >=50)
LDL Cholesterol (Calc): 82 mg/dL (calc)
Non-HDL Cholesterol (Calc): 96 mg/dL (calc) (ref <130)
Total CHOL/HDL Ratio: 2.9 (calc) (ref <5.0)
Triglycerides: 61 mg/dL (ref <150)

## 2019-04-08 LAB — CBC WITH DIFFERENTIAL/PLATELET
Absolute Monocytes: 500 cells/uL (ref 200–950)
Basophils Absolute: 8 cells/uL (ref 0–200)
Basophils Relative: 0.2 %
Eosinophils Absolute: 78 cells/uL (ref 15–500)
Eosinophils Relative: 1.9 %
HCT: 36.9 % (ref 35.0–45.0)
Hemoglobin: 11.3 g/dL — ABNORMAL LOW (ref 11.7–15.5)
Lymphs Abs: 1894 cells/uL (ref 850–3900)
MCH: 23.8 pg — ABNORMAL LOW (ref 27.0–33.0)
MCHC: 30.6 g/dL — ABNORMAL LOW (ref 32.0–36.0)
MCV: 77.8 fL — ABNORMAL LOW (ref 80.0–100.0)
MPV: 9.8 fL (ref 7.5–12.5)
Monocytes Relative: 12.2 %
Neutro Abs: 1620 cells/uL (ref 1500–7800)
Neutrophils Relative %: 39.5 %
Platelets: 320 10*3/uL (ref 140–400)
RBC: 4.74 10*6/uL (ref 3.80–5.10)
RDW: 16.9 % — ABNORMAL HIGH (ref 11.0–15.0)
Total Lymphocyte: 46.2 %
WBC: 4.1 10*3/uL (ref 3.8–10.8)

## 2019-04-08 LAB — IRON,TIBC AND FERRITIN PANEL
%SAT: 17 % (calc) (ref 16–45)
Ferritin: 30 ng/mL (ref 16–232)
Iron: 60 ug/dL (ref 45–160)
TIBC: 357 mcg/dL (calc) (ref 250–450)

## 2019-04-08 LAB — TEST AUTHORIZATION

## 2019-05-01 ENCOUNTER — Other Ambulatory Visit: Payer: Self-pay | Admitting: Family Medicine

## 2019-05-01 DIAGNOSIS — G4700 Insomnia, unspecified: Secondary | ICD-10-CM

## 2019-05-01 NOTE — Telephone Encounter (Signed)
   Notes to clinic: comment: REQUEST FOR 90 DAYS PRESCRIPTION. DX Code Needed.   Requested Prescriptions  Pending Prescriptions Disp Refills   hydrOXYzine (ATARAX/VISTARIL) 10 MG tablet [Pharmacy Med Name: HYDROXYZINE HCL 10 MG TABLET] 180 tablet 1    Sig: Take 1-2 tablets (10-20 mg total) by mouth at bedtime. In place of seroquel      Ear, Nose, and Throat:  Antihistamines Passed - 05/01/2019 11:31 AM      Passed - Valid encounter within last 12 months    Recent Outpatient Visits           3 weeks ago MDD (recurrent major depressive disorder) in remission [facility])   [facility] Steele Sizer, MD   3 months ago Prediabetes   [facility] Steele Sizer, MD   11 months ago Benign hypertension   [place] [place]   1 year ago Major depression in complete remission [facility])   [facility] Steele Sizer, MD   1 year ago Menorrhagia with regular cycle   [place], NP       Future Appointments             In 2 months Steele Sizer, MD [facility], [place]   In 5 months Steele Sizer, MD [facility], [facility]

## 2019-05-19 DIAGNOSIS — Z23 Encounter for immunization: Secondary | ICD-10-CM | POA: Diagnosis not present

## 2019-06-22 DIAGNOSIS — Z23 Encounter for immunization: Secondary | ICD-10-CM | POA: Diagnosis not present

## 2019-07-01 ENCOUNTER — Other Ambulatory Visit: Payer: Self-pay | Admitting: Family Medicine

## 2019-07-01 DIAGNOSIS — I10 Essential (primary) hypertension: Secondary | ICD-10-CM

## 2019-07-05 ENCOUNTER — Other Ambulatory Visit: Payer: Self-pay | Admitting: Surgery

## 2019-07-05 ENCOUNTER — Encounter: Payer: BC Managed Care – PPO | Admitting: Family Medicine

## 2019-07-05 DIAGNOSIS — N6310 Unspecified lump in the right breast, unspecified quadrant: Secondary | ICD-10-CM

## 2019-07-11 ENCOUNTER — Ambulatory Visit: Payer: BC Managed Care – PPO | Admitting: Family Medicine

## 2019-07-12 ENCOUNTER — Other Ambulatory Visit (HOSPITAL_COMMUNITY)
Admission: RE | Admit: 2019-07-12 | Discharge: 2019-07-12 | Disposition: A | Discharge status: Home / Self Care | Payer: BC Managed Care – PPO | Source: Ambulatory Visit | Attending: Family Medicine | Admitting: Family Medicine

## 2019-07-12 ENCOUNTER — Ambulatory Visit (INDEPENDENT_AMBULATORY_CARE_PROVIDER_SITE_OTHER): Payer: BC Managed Care – PPO | Admitting: Family Medicine

## 2019-07-12 ENCOUNTER — Telehealth: Payer: Self-pay

## 2019-07-12 ENCOUNTER — Encounter: Payer: Self-pay | Admitting: Family Medicine

## 2019-07-12 ENCOUNTER — Other Ambulatory Visit: Payer: Self-pay

## 2019-07-12 VITALS — BP 118/68 | HR 97 | Temp 97.3°F | Resp 16 | Ht 66.75 in | Wt 208.8 lb

## 2019-07-12 DIAGNOSIS — R5383 Other fatigue: Secondary | ICD-10-CM

## 2019-07-12 DIAGNOSIS — B349 Viral infection, unspecified: Secondary | ICD-10-CM

## 2019-07-12 DIAGNOSIS — M791 Myalgia, unspecified site: Secondary | ICD-10-CM | POA: Diagnosis not present

## 2019-07-12 DIAGNOSIS — Z0000 Encounter for general adult medical examination without abnormal findings: Secondary | ICD-10-CM

## 2019-07-12 DIAGNOSIS — R739 Hyperglycemia, unspecified: Secondary | ICD-10-CM | POA: Diagnosis not present

## 2019-07-12 DIAGNOSIS — R52 Pain, unspecified: Secondary | ICD-10-CM

## 2019-07-12 DIAGNOSIS — R197 Diarrhea, unspecified: Secondary | ICD-10-CM | POA: Diagnosis not present

## 2019-07-12 DIAGNOSIS — Z113 Encounter for screening for infections with a predominantly sexual mode of transmission: Secondary | ICD-10-CM | POA: Diagnosis not present

## 2019-07-12 DIAGNOSIS — Z1159 Encounter for screening for other viral diseases: Secondary | ICD-10-CM
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
## 2019-07-12 NOTE — Telephone Encounter (Signed)
Pt. Calling to report she went to have her COVID 19 test as ordered by Dr. Ancil Boozer today. Order is in chart. States [facility] and they said they don't perform the test." Please advise pt.

## 2019-07-13 ENCOUNTER — Ambulatory Visit: Payer: BC Managed Care – PPO | Attending: Family

## 2019-07-13 DIAGNOSIS — Z20822 Contact with and (suspected) exposure to covid-19: Secondary | ICD-10-CM

## 2019-07-14 LAB — SARS-COV-2, NAA 2 DAY TAT

## 2019-07-14 LAB — NOVEL CORONAVIRUS, NAA: SARS-CoV-2, NAA: NOT DETECTED

## 2019-07-14 LAB — URINE CYTOLOGY ANCILLARY ONLY
Chlamydia: NEGATIVE
Comment: NEGATIVE
Comment: NORMAL
Neisseria Gonorrhea: NEGATIVE

## 2019-07-15 LAB — CBC WITH DIFFERENTIAL/PLATELET
Absolute Monocytes: 465 cells/uL (ref 200–950)
Basophils Absolute: 9 cells/uL (ref 0–200)
Basophils Relative: 0.2 %
Eosinophils Absolute: 89 cells/uL (ref 15–500)
Eosinophils Relative: 1.9 %
HCT: 37.5 % (ref 35.0–45.0)
Hemoglobin: 11.6 g/dL — ABNORMAL LOW (ref 11.7–15.5)
Lymphs Abs: 2176 cells/uL (ref 850–3900)
MCH: 25.4 pg — ABNORMAL LOW (ref 27.0–33.0)
MCHC: 30.9 g/dL — ABNORMAL LOW (ref 32.0–36.0)
MCV: 82.2 fL (ref 80.0–100.0)
MPV: 10 fL (ref 7.5–12.5)
Monocytes Relative: 9.9 %
Neutro Abs: 1960 cells/uL (ref 1500–7800)
Neutrophils Relative %: 41.7 %
Platelets: 371 10*3/uL (ref 140–400)
RBC: 4.56 10*6/uL (ref 3.80–5.10)
RDW: 14 % (ref 11.0–15.0)
Total Lymphocyte: 46.3 %
WBC: 4.7 10*3/uL (ref 3.8–10.8)

## 2019-07-15 LAB — COMPLETE METABOLIC PANEL WITH GFR
AG Ratio: 1.2 (calc) (ref 1.0–2.5)
ALT: 14 U/L (ref 6–29)
AST: 15 U/L (ref 10–35)
Albumin: 4 g/dL (ref 3.6–5.1)
Alkaline phosphatase (APISO): 56 U/L (ref 37–153)
BUN: 17 mg/dL (ref 7–25)
CO2: 31 mmol/L (ref 20–32)
Calcium: 9.7 mg/dL (ref 8.6–10.4)
Chloride: 104 mmol/L (ref 98–110)
Creat: 0.89 mg/dL (ref 0.50–1.05)
GFR, Est African American: 86 mL/min/{1.73_m2} (ref >=60)
GFR, Est Non African American: 74 mL/min/{1.73_m2} (ref >=60)
Globulin: 3.3 g/dL (calc) (ref 1.9–3.7)
Glucose, Bld: 80 mg/dL (ref 65–99)
Potassium: 4.9 mmol/L (ref 3.5–5.3)
Sodium: 141 mmol/L (ref 135–146)
Total Bilirubin: 0.3 mg/dL (ref 0.2–1.2)
Total Protein: 7.3 g/dL (ref 6.1–8.1)

## 2019-07-15 LAB — ANTI-NUCLEAR AB-TITER (ANA TITER): ANA Titer 1: 1:320 {titer} — ABNORMAL HIGH

## 2019-07-15 LAB — HEPATITIS C ANTIBODY
Hepatitis C Ab: NONREACTIVE
SIGNAL TO CUT-OFF: 0.01 (ref <1.00)

## 2019-07-15 LAB — ANA,IFA RA DIAG PNL W/RFLX TIT/PATN
Anti Nuclear Antibody (ANA): POSITIVE — AB
Cyclic Citrullin Peptide Ab: <16 UNITS
Rheumatoid fact SerPl-aCnc: 22 IU/mL — ABNORMAL HIGH (ref <14)

## 2019-07-15 LAB — RPR: RPR Ser Ql: NONREACTIVE

## 2019-07-15 LAB — HEMOGLOBIN A1C
Hgb A1c MFr Bld: 6 % of total Hgb — ABNORMAL HIGH (ref <5.7)
Mean Plasma Glucose: 126 (calc)
eAG (mmol/L): 7 (calc)

## 2019-07-15 LAB — SEDIMENTATION RATE: Sed Rate: 28 mm/h (ref 0–30)

## 2019-07-15 LAB — HIV ANTIBODY (ROUTINE TESTING W REFLEX): HIV 1&2 Ab, 4th Generation: NONREACTIVE

## 2019-07-15 LAB — C-REACTIVE PROTEIN: CRP: 3.9 mg/L (ref <8.0)

## 2019-07-17 ENCOUNTER — Other Ambulatory Visit: Payer: Self-pay | Admitting: Family Medicine

## 2019-07-17 DIAGNOSIS — M791 Myalgia, unspecified site: Secondary | ICD-10-CM

## 2019-07-17 DIAGNOSIS — R768 Other specified abnormal immunological findings in serum: Secondary | ICD-10-CM

## 2019-07-17 DIAGNOSIS — R5383 Other fatigue: Secondary | ICD-10-CM

## 2019-07-18 ENCOUNTER — Telehealth: Payer: Self-pay

## 2019-07-18 DIAGNOSIS — R768 Other specified abnormal immunological findings in serum: Secondary | ICD-10-CM

## 2019-07-18 DIAGNOSIS — M791 Myalgia, unspecified site: Secondary | ICD-10-CM

## 2019-07-18 DIAGNOSIS — R5383 Other fatigue: Secondary | ICD-10-CM

## 2019-07-18 NOTE — Telephone Encounter (Signed)
Copied from [place] [id number]. Topic: General - Other >> Jul 15, 2019  3:13 PM Catherine Christensen, [place] C wrote: Reason for CRM: pt called in for her lab results. Pt says that she can see them on mychart but dont understand what they are. Pt is requesting a call back to discuss. Pt says that she will be back at work at 4:30 and will be unable to talk after that time today.   Tried to reach out to patient to inform her that her results indicated that Covid was not detected. No answer. No vm setup.

## 2019-07-18 NOTE — Telephone Encounter (Signed)
Patient missed a call back with her test results.  She would like the nurse to call her before 11:15 if possible or she stated she is available all day tomorrow.  CB# [phone]

## 2019-07-19 NOTE — Telephone Encounter (Signed)
Spoke with patient and informed her that Dr. Ancil Boozer has placed a referral to rheumatology due to abnormal ANA titer. Patient verbalized understanding.

## 2019-07-22 ENCOUNTER — Other Ambulatory Visit: Payer: Self-pay | Admitting: Obstetrics and Gynecology

## 2019-07-26 NOTE — Telephone Encounter (Signed)
Please advise 

## 2019-08-02 NOTE — Telephone Encounter (Signed)
Error

## 2019-08-16 ENCOUNTER — Ambulatory Visit: Payer: BC Managed Care – PPO | Admitting: Family Medicine

## 2019-08-16 ENCOUNTER — Other Ambulatory Visit: Payer: Self-pay

## 2019-08-16 ENCOUNTER — Encounter: Payer: Self-pay | Admitting: Family Medicine

## 2019-08-16 VITALS — BP 110/70 | HR 78 | Temp 98.2°F | Resp 16 | Ht 66.75 in | Wt 213.1 lb

## 2019-08-16 DIAGNOSIS — E785 Hyperlipidemia, unspecified: Secondary | ICD-10-CM | POA: Diagnosis not present

## 2019-08-16 DIAGNOSIS — F419 Anxiety disorder, unspecified: Secondary | ICD-10-CM | POA: Diagnosis not present

## 2019-08-16 DIAGNOSIS — R768 Other specified abnormal immunological findings in serum: Secondary | ICD-10-CM | POA: Insufficient documentation

## 2019-08-16 DIAGNOSIS — I10 Essential (primary) hypertension: Secondary | ICD-10-CM

## 2019-08-16 DIAGNOSIS — R739 Hyperglycemia, unspecified: Secondary | ICD-10-CM

## 2019-08-16 DIAGNOSIS — F3340 Major depressive disorder, recurrent, in remission, unspecified: Secondary | ICD-10-CM

## 2019-08-16 DIAGNOSIS — E559 Vitamin D deficiency, unspecified: Secondary | ICD-10-CM | POA: Diagnosis not present

## 2019-08-16 DIAGNOSIS — D500 Iron deficiency anemia secondary to blood loss (chronic): Secondary | ICD-10-CM

## 2019-08-16 DIAGNOSIS — Z1321 Encounter for screening for nutritional disorder: Secondary | ICD-10-CM | POA: Insufficient documentation

## 2019-08-16 DIAGNOSIS — M791 Myalgia, unspecified site: Secondary | ICD-10-CM

## 2019-08-16 DIAGNOSIS — R7303 Prediabetes: Secondary | ICD-10-CM

## 2019-08-16 MED ORDER — ROSUVASTATIN CALCIUM 20 MG PO TABS: 20.0000 mg | ORAL_TABLET | Freq: Every day | ORAL | 1 refills | Status: DC

## 2019-08-16 MED ORDER — AMLODIPINE BESYLATE 10 MG PO TABS: 10.0000 mg | ORAL_TABLET | Freq: Every day | ORAL | 1 refills | Status: DC

## 2019-08-16 MED ORDER — ESCITALOPRAM OXALATE 10 MG PO TABS: 10.0000 mg | ORAL_TABLET | Freq: Every day | ORAL | 1 refills | Status: DC
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
## 2019-08-30 ENCOUNTER — Ambulatory Visit: Payer: BC Managed Care – PPO

## 2019-09-13 ENCOUNTER — Other Ambulatory Visit: Payer: BC Managed Care – PPO

## 2019-09-20 ENCOUNTER — Ambulatory Visit
Admission: RE | Admit: 2019-09-20 | Discharge: 2019-09-20 | Disposition: A | Discharge status: Home / Self Care | Payer: BC Managed Care – PPO | Source: Ambulatory Visit | Attending: Surgery | Admitting: Surgery

## 2019-09-20 DIAGNOSIS — N6310 Unspecified lump in the right breast, unspecified quadrant: Secondary | ICD-10-CM

## 2019-09-20 DIAGNOSIS — N6311 Unspecified lump in the right breast, upper outer quadrant: Secondary | ICD-10-CM | POA: Diagnosis not present

## 2019-09-20 DIAGNOSIS — R928 Other abnormal and inconclusive findings on diagnostic imaging of breast: Secondary | ICD-10-CM | POA: Diagnosis not present

## 2019-10-04 ENCOUNTER — Ambulatory Visit: Payer: BC Managed Care – PPO | Admitting: Family Medicine

## 2019-12-20 ENCOUNTER — Ambulatory Visit: Payer: BC Managed Care – PPO | Admitting: Family Medicine

## 2019-12-20 ENCOUNTER — Other Ambulatory Visit: Payer: Self-pay

## 2019-12-20 ENCOUNTER — Encounter: Payer: Self-pay | Admitting: Family Medicine

## 2019-12-20 VITALS — BP 118/72 | HR 85 | Temp 98.6°F | Resp 18 | Ht 67.0 in | Wt 213.4 lb

## 2019-12-20 DIAGNOSIS — Z9889 Other specified postprocedural states: Secondary | ICD-10-CM

## 2019-12-20 DIAGNOSIS — I10 Essential (primary) hypertension: Secondary | ICD-10-CM | POA: Diagnosis not present

## 2019-12-20 DIAGNOSIS — E785 Hyperlipidemia, unspecified: Secondary | ICD-10-CM | POA: Diagnosis not present

## 2019-12-20 DIAGNOSIS — R768 Other specified abnormal immunological findings in serum: Secondary | ICD-10-CM

## 2019-12-20 DIAGNOSIS — F3340 Major depressive disorder, recurrent, in remission, unspecified: Secondary | ICD-10-CM | POA: Diagnosis not present

## 2019-12-20 DIAGNOSIS — R739 Hyperglycemia, unspecified: Secondary | ICD-10-CM

## 2019-12-20 DIAGNOSIS — Z23 Encounter for immunization: Secondary | ICD-10-CM | POA: Diagnosis not present

## 2019-12-20 DIAGNOSIS — Z1231 Encounter for screening mammogram for malignant neoplasm of breast: Secondary | ICD-10-CM

## 2019-12-20 DIAGNOSIS — R7303 Prediabetes: Secondary | ICD-10-CM

## 2019-12-20 MED ORDER — ROSUVASTATIN CALCIUM 20 MG PO TABS: 20.0000 mg | ORAL_TABLET | Freq: Every day | ORAL | 1 refills | Status: DC

## 2019-12-20 MED ORDER — METFORMIN HCL ER 500 MG PO TB24: 500.0000 mg | ORAL_TABLET | Freq: Every day | ORAL | 1 refills | Status: DC
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
## 2019-12-25 ENCOUNTER — Emergency Department: Payer: BC Managed Care – PPO

## 2019-12-25 ENCOUNTER — Emergency Department
Admission: EM | Admit: 2019-12-25 | Discharge: 2019-12-25 | Disposition: A | Discharge status: Home / Self Care | Payer: BC Managed Care – PPO | Attending: Emergency Medicine | Admitting: Emergency Medicine

## 2019-12-25 ENCOUNTER — Other Ambulatory Visit: Payer: Self-pay

## 2019-12-25 DIAGNOSIS — Z79899 Other long term (current) drug therapy: Secondary | ICD-10-CM | POA: Insufficient documentation

## 2019-12-25 DIAGNOSIS — Z87891 Personal history of nicotine dependence: Secondary | ICD-10-CM | POA: Diagnosis not present

## 2019-12-25 DIAGNOSIS — M25561 Pain in right knee: Secondary | ICD-10-CM | POA: Diagnosis not present

## 2019-12-25 DIAGNOSIS — I10 Essential (primary) hypertension: Secondary | ICD-10-CM | POA: Insufficient documentation

## 2019-12-25 DIAGNOSIS — E119 Type 2 diabetes mellitus without complications: Secondary | ICD-10-CM | POA: Insufficient documentation

## 2019-12-25 DIAGNOSIS — M1711 Unilateral primary osteoarthritis, right knee: Secondary | ICD-10-CM | POA: Diagnosis not present

## 2019-12-25 MED ORDER — MELOXICAM 15 MG PO TABS: 15.0000 mg | ORAL_TABLET | Freq: Every day | ORAL | 0 refills | Status: DC
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
## 2019-12-25 NOTE — ED Notes (Signed)
Pt presents to the ED for ongoing R knee pain for approx a month. Pt states that she works for a [employer]. Pt states that she does not remember any injury to that R knee. Pt states she was seen at Urgent Care where they prescribed her muscle relaxants without relief. Pt states ever since she came back to work it has gotten worse. Pt is A&Ox4 and NAD. Ambulatory to room.

## 2019-12-25 NOTE — ED Triage Notes (Signed)
Pt states that she hurt her right knee about a month ago and it hasn't gotten any better. Swelling, pain when sleeping. Ambulatory at this time.

## 2019-12-25 NOTE — Discharge Instructions (Signed)
Follow-up with your primary care provider if any continued problems or concerns.  If not improving or any worsening of your symptoms she may need to see an orthopedist and Dr. Rudene Christians who is on-call for orthopedics this week and is located in [facility].  His contact information is listed on your discharge papers.  Ice and elevate today if needed for knee pain.  Begin taking meloxicam 1 daily with food.  You do not have to sleep in the knee immobilizer unless you feel that this improves your knee pain.  You will need to talk to your supervisor about working with the knee immobilizer on.
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
## 2020-01-04 ENCOUNTER — Ambulatory Visit: Payer: BC Managed Care – PPO | Admitting: Internal Medicine

## 2020-01-04 ENCOUNTER — Encounter: Payer: Self-pay | Admitting: Internal Medicine

## 2020-01-04 ENCOUNTER — Other Ambulatory Visit: Payer: Self-pay

## 2020-01-04 VITALS — BP 114/60 | HR 81 | Temp 98.4°F | Resp 16 | Ht 67.0 in | Wt 215.4 lb

## 2020-01-04 DIAGNOSIS — Z09 Encounter for follow-up examination after completed treatment for conditions other than malignant neoplasm: Secondary | ICD-10-CM | POA: Diagnosis not present

## 2020-01-04 DIAGNOSIS — M25561 Pain in right knee: Secondary | ICD-10-CM

## 2020-01-04 DIAGNOSIS — M1711 Unilateral primary osteoarthritis, right knee: Secondary | ICD-10-CM

## 2020-01-04 DIAGNOSIS — G8929 Other chronic pain: Secondary | ICD-10-CM

## 2020-01-04 NOTE — Patient Instructions (Addendum)
Continue with the RICE modalities as we discussed presently  Can continue the meloxicam product daily, take with food  Referral placed for orthopedics   Acute Knee Pain, Adult Many things can cause knee pain. Sometimes, knee pain is sudden (acute) and may be caused by damage, swelling, or irritation of the muscles and tissues that support your knee. The pain often goes away on its own with time and rest. If the pain does not go away, tests may be done to find out what is causing the pain. Follow these instructions at home: Pay attention to any changes in your symptoms. Take these actions to relieve your pain. If you have a knee sleeve or brace:   Wear the sleeve or brace as told by your doctor. Remove it only as told by your doctor.  Loosen the sleeve or brace if your toes: ? Tingle. ? Become numb. ? Turn cold and blue.  Keep the sleeve or brace clean.  If the sleeve or brace is not waterproof: ? Do not let it get wet. ? Cover it with a watertight covering when you take a bath or shower. Activity  Rest your knee.  Do not do things that cause pain.  Avoid activities where both feet leave the ground at the same time (high-impact activities). Examples are running, jumping rope, and doing jumping jacks.  Work with a physical therapist to make a safe exercise program, as told by your doctor. Managing pain, stiffness, and swelling   If told, put ice on the knee: ? Put ice in a plastic bag. ? Place a towel between your skin and the bag. ? Leave the ice on for 20 minutes, 2-3 times a day.  If told, put pressure (compression) on your injured knee to control swelling, give support, and help with discomfort. Compression may be done with an elastic bandage. General instructions  Take all medicines only as told by your doctor.  Raise (elevate) your knee while you are sitting or lying down. Make sure your knee is higher than your heart.  Sleep with a pillow under your knee.  Do  not use any products that contain nicotine or tobacco. These include cigarettes, e-cigarettes, and chewing tobacco. These products may slow down healing. If you need help quitting, ask your doctor.  If you are overweight, work with your doctor and a food expert (dietitian) to set goals to lose weight. Being overweight can make your knee hurt more.  Keep all follow-up visits as told by your doctor. This is important. Contact a doctor if:  The knee pain does not stop.  The knee pain changes or gets worse.  You have a fever along with knee pain.  Your knee feels warm when you touch it.  Your knee gives out or locks up. Get help right away if:  Your knee swells, and the swelling gets worse.  You cannot move your knee.  You have very bad knee pain. Summary  Many things can cause knee pain. The pain often goes away on its own with time and rest.  Your doctor may do tests to find out the cause of the pain.  Pay attention to any changes in your symptoms. Relieve your pain with rest, medicines, light activity, and use of ice.  Get help right away if you cannot move your knee or your knee pain is very bad. This information is not intended to replace advice given to you by your health care provider. Make sure you discuss any  questions you have with your health care provider. Document Revised: 07/23/2017 Document Reviewed: 07/23/2017 Elsevier Patient Education  [place].

## 2020-01-27 DIAGNOSIS — M25562 Pain in left knee: Secondary | ICD-10-CM | POA: Diagnosis not present

## 2020-01-27 DIAGNOSIS — M25561 Pain in right knee: Secondary | ICD-10-CM | POA: Diagnosis not present

## 2020-02-04 ENCOUNTER — Other Ambulatory Visit: Payer: Self-pay | Admitting: Obstetrics and Gynecology

## 2020-02-04 DIAGNOSIS — R7303 Prediabetes: Secondary | ICD-10-CM

## 2020-03-02 IMAGING — DX DG CHEST 1V
1 series · 1 of 1 positions shown · non-contrast
Comparison: [DATE] [DATE], [DATE] [DATE] p.m.

CLINICAL DATA: Nasogastric tube placement

EXAM:
CHEST  1 VIEW

[chest ap]
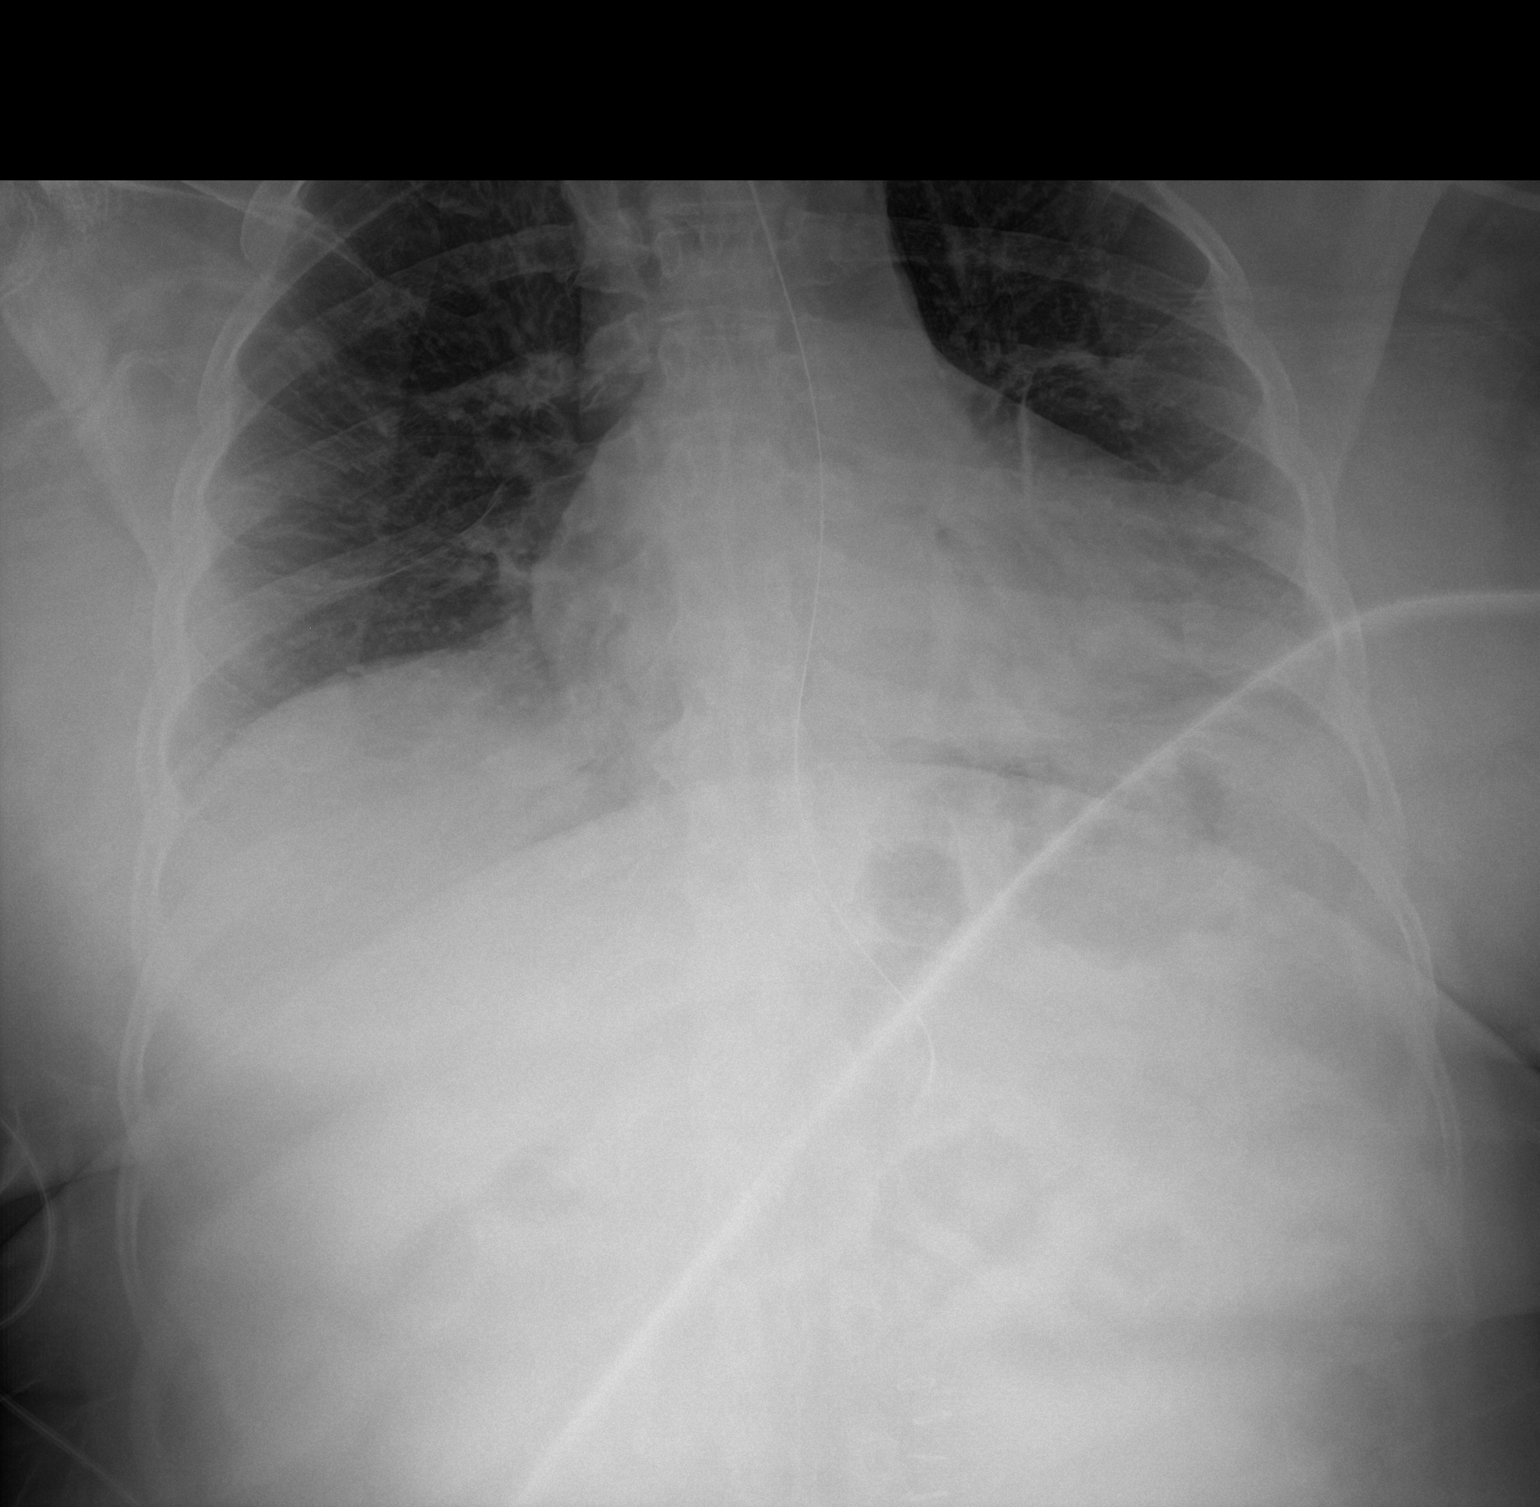

[1 of 1 positions shown; findings below may reference images not displayed]

FINDINGS: Nasogastric tube is identified with distal tip in the proximal
stomach. The heart size is enlarged. The mediastinal contour is
normal. There is no focal infiltrate, pulmonary edema, or pleural
effusion. Bony structures are stable.
IMPRESSION: Nasogastric tube is identified with distal tip in the proximal
stomach.

## 2020-03-02 IMAGING — DX DG CHEST 1V PORT
1 series · 1 of 1 positions shown · non-contrast
Comparison: None available

CLINICAL DATA: Tube placement

EXAM:
PORTABLE CHEST 1 VIEW

[chest ap]
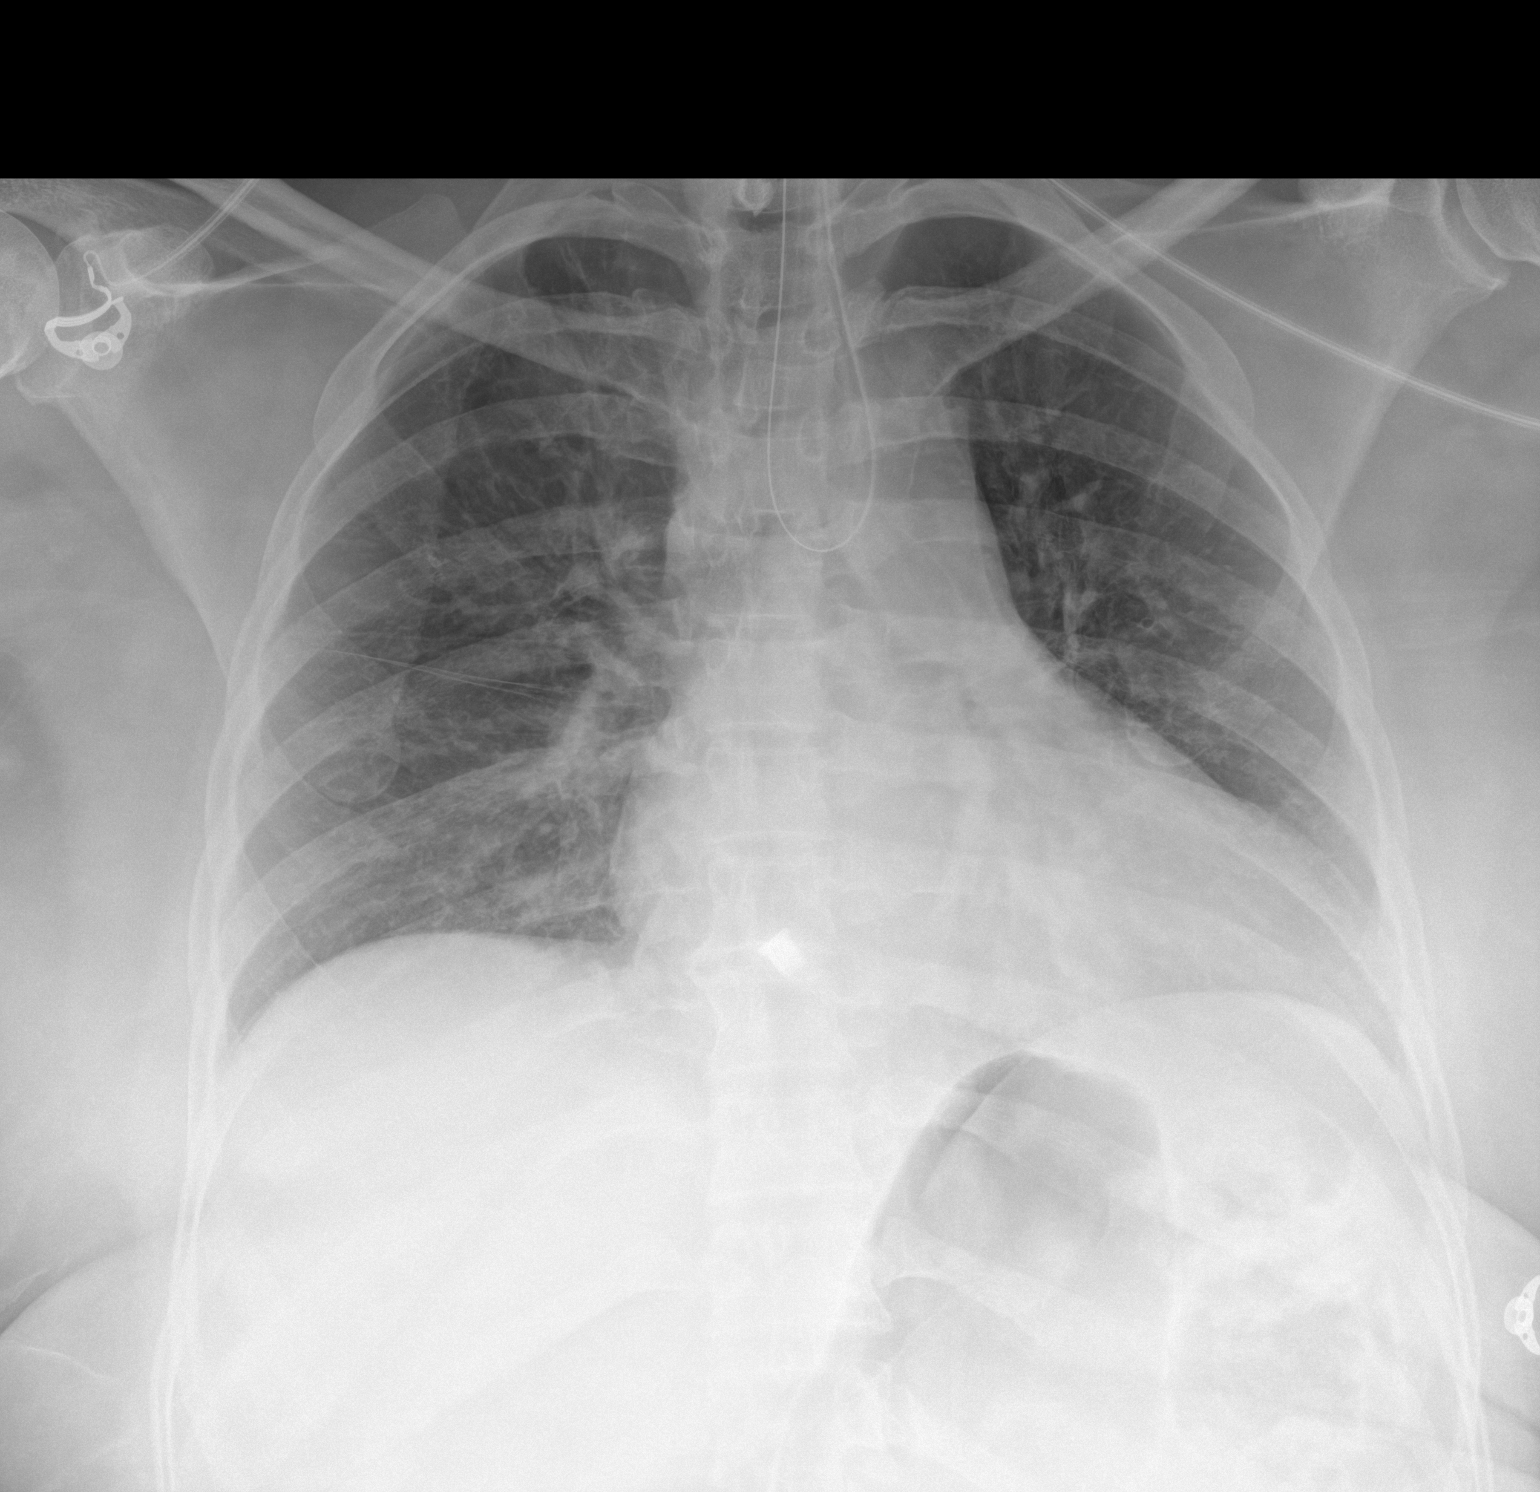

[1 of 1 positions shown; findings below may reference images not displayed]

FINDINGS: Esophageal tubing looped back upon itself over the mid to upper
mediastinum. Mild cardiomegaly. No focal opacity or pleural
effusion. No pneumothorax. Lucent structure in the upper abdomen
presumably represents distended stomach.
IMPRESSION: 1. Esophageal tubing is looped back upon itself over the proximal to
mid mediastinum, tip is presumed to be positioned cephalad but
beyond limits of the image.
2. Cardiomegaly

These results will be called to the ordering clinician or
representative by the Radiologist Assistant, and communication
documented in the PACS or zVision Dashboard.
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
## 2020-03-29 ENCOUNTER — Ambulatory Visit: Payer: BC Managed Care – PPO | Admitting: Family Medicine

## 2020-03-29 ENCOUNTER — Other Ambulatory Visit: Payer: Self-pay

## 2020-03-29 ENCOUNTER — Encounter: Payer: Self-pay | Admitting: Family Medicine

## 2020-03-29 VITALS — BP 128/76 | HR 97 | Temp 97.9°F | Resp 16 | Ht 67.0 in | Wt 220.9 lb

## 2020-03-29 DIAGNOSIS — F3340 Major depressive disorder, recurrent, in remission, unspecified: Secondary | ICD-10-CM

## 2020-03-29 DIAGNOSIS — R7303 Prediabetes: Secondary | ICD-10-CM

## 2020-03-29 DIAGNOSIS — F419 Anxiety disorder, unspecified: Secondary | ICD-10-CM | POA: Diagnosis not present

## 2020-03-29 DIAGNOSIS — E785 Hyperlipidemia, unspecified: Secondary | ICD-10-CM

## 2020-03-29 DIAGNOSIS — Z9151 Personal history of suicidal behavior: Secondary | ICD-10-CM

## 2020-03-29 DIAGNOSIS — E559 Vitamin D deficiency, unspecified: Secondary | ICD-10-CM

## 2020-03-29 DIAGNOSIS — I10 Essential (primary) hypertension: Secondary | ICD-10-CM

## 2020-03-29 LAB — POCT GLYCOSYLATED HEMOGLOBIN (HGB A1C): Hemoglobin A1C: 6 % — AB (ref 4.0–5.6)

## 2020-03-29 MED ORDER — AMLODIPINE BESYLATE 10 MG PO TABS: 10.0000 mg | ORAL_TABLET | Freq: Every day | ORAL | 1 refills | Status: DC

## 2020-03-29 MED ORDER — VITAMIN D 50 MCG (2000 UT) PO CAPS: 1.0000 | ORAL_CAPSULE | Freq: Every day | ORAL | 0 refills | Status: DC

## 2020-03-30 ENCOUNTER — Ambulatory Visit: Payer: BC Managed Care – PPO | Admitting: Family Medicine

## 2020-04-03 ENCOUNTER — Other Ambulatory Visit: Payer: Self-pay | Admitting: Family Medicine

## 2020-04-03 DIAGNOSIS — F3340 Major depressive disorder, recurrent, in remission, unspecified: Secondary | ICD-10-CM

## 2020-04-03 DIAGNOSIS — F419 Anxiety disorder, unspecified: Secondary | ICD-10-CM

## 2020-04-11 ENCOUNTER — Encounter: Payer: Self-pay | Admitting: Family Medicine

## 2020-04-11 ENCOUNTER — Other Ambulatory Visit: Payer: Self-pay | Admitting: Family Medicine

## 2020-04-12 ENCOUNTER — Other Ambulatory Visit: Payer: Self-pay | Admitting: Family Medicine

## 2020-04-12 DIAGNOSIS — R1013 Epigastric pain: Secondary | ICD-10-CM

## 2020-04-13 DIAGNOSIS — R1013 Epigastric pain: Secondary | ICD-10-CM | POA: Diagnosis not present

## 2020-04-16 ENCOUNTER — Encounter: Payer: Self-pay | Admitting: Family Medicine

## 2020-04-16 DIAGNOSIS — K219 Gastro-esophageal reflux disease without esophagitis: Secondary | ICD-10-CM

## 2020-04-16 DIAGNOSIS — R1013 Epigastric pain: Secondary | ICD-10-CM

## 2020-04-16 LAB — H. PYLORI BREATH TEST: H. pylori Breath Test: NOT DETECTED

## 2020-05-15 ENCOUNTER — Telehealth: Payer: Self-pay | Admitting: Gastroenterology

## 2020-05-15 NOTE — Telephone Encounter (Signed)
Patient called Catherine Christensen  to reschedule appointment.  I called back to reschedule and patient does not have voicemail.  I canceled appointment for now.

## 2020-05-16 ENCOUNTER — Ambulatory Visit: Payer: BC Managed Care – PPO | Admitting: Gastroenterology

## 2020-05-31 ENCOUNTER — Encounter: Payer: Self-pay | Admitting: Family Medicine

## 2020-06-14 ENCOUNTER — Other Ambulatory Visit: Payer: Self-pay | Admitting: Family Medicine

## 2020-06-14 DIAGNOSIS — Z1231 Encounter for screening mammogram for malignant neoplasm of breast: Secondary | ICD-10-CM

## 2020-06-16 ENCOUNTER — Encounter: Payer: Self-pay | Admitting: Obstetrics and Gynecology
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
## 2020-06-20 ENCOUNTER — Ambulatory Visit: Payer: BC Managed Care – PPO | Admitting: Family Medicine

## 2020-06-22 IMAGING — MG MM BREAST LOCALIZATION CLIP
6 series · 7 of 18 positions shown · non-contrast
Comparison: Previous exam(s).
COMPARISON: Previous exam(s).

Addendum:
CLINICAL DATA: 53-year-old female for tissue sampling of
UPPER-OUTER RIGHT breast mass and enlarged RIGHT axillary lymph node

EXAM:
ULTRASOUND GUIDED RIGHT BREAST CORE NEEDLE BIOPSY
ULTRASOUND-GUIDED RIGHT AXILLARY LYMPH NODE BIOPSY
DIAGNOSTIC RIGHT MAMMOGRAM POST ULTRASOUND BIOPSY

[R CC synth-2D]
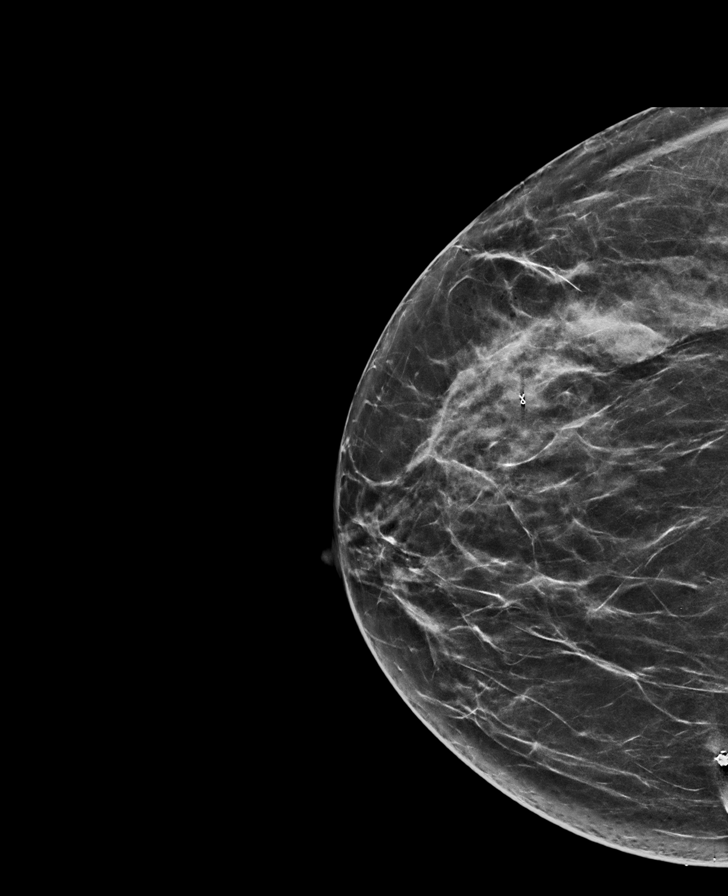

[R MLO synth-2D]
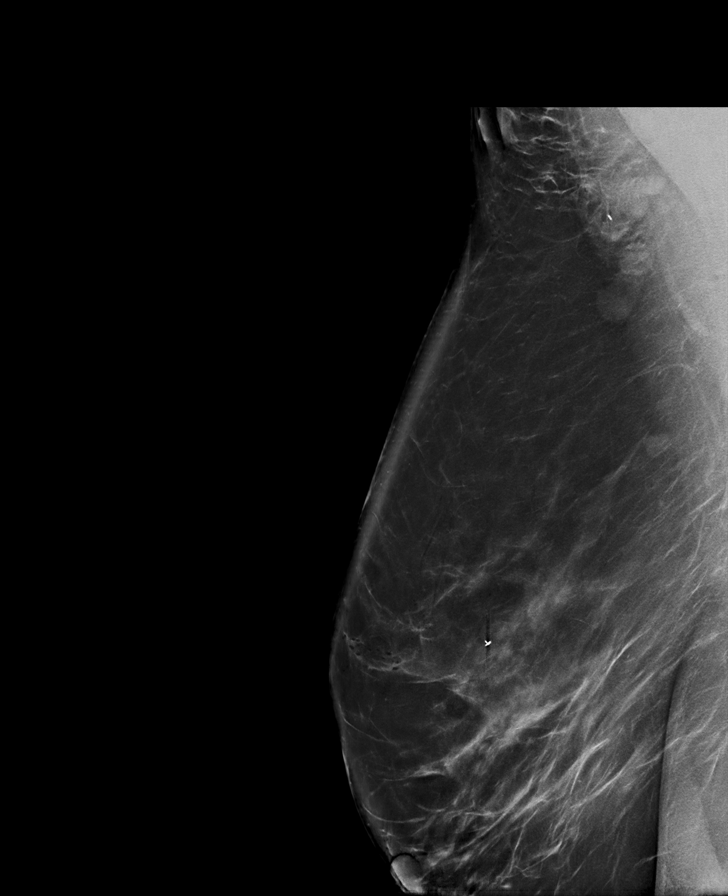

[R ML synth-2D]
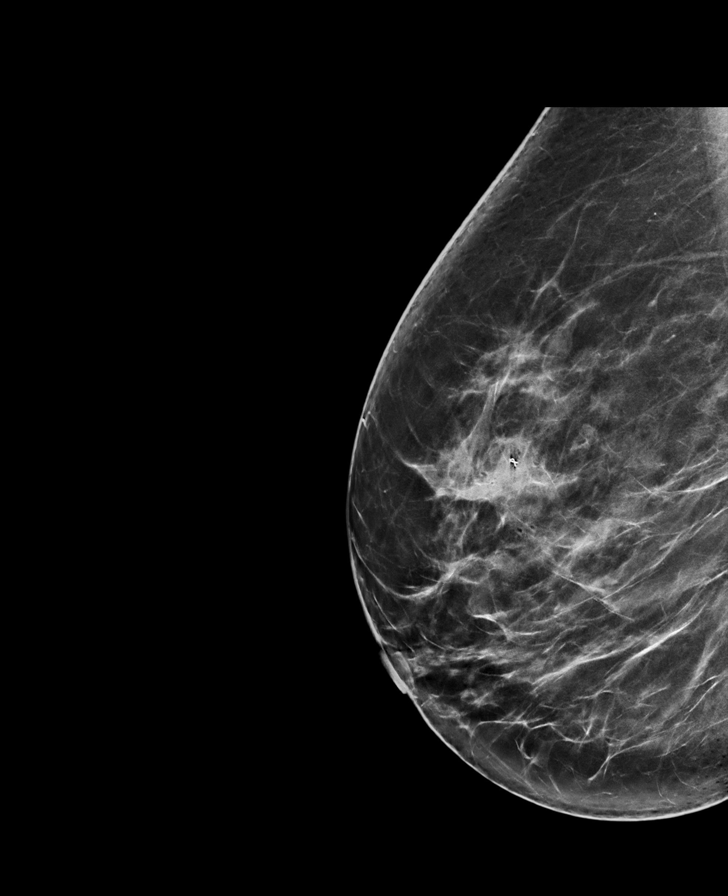

[R CC tomo · 2 of 73 frames shown]
[frame 24/73]
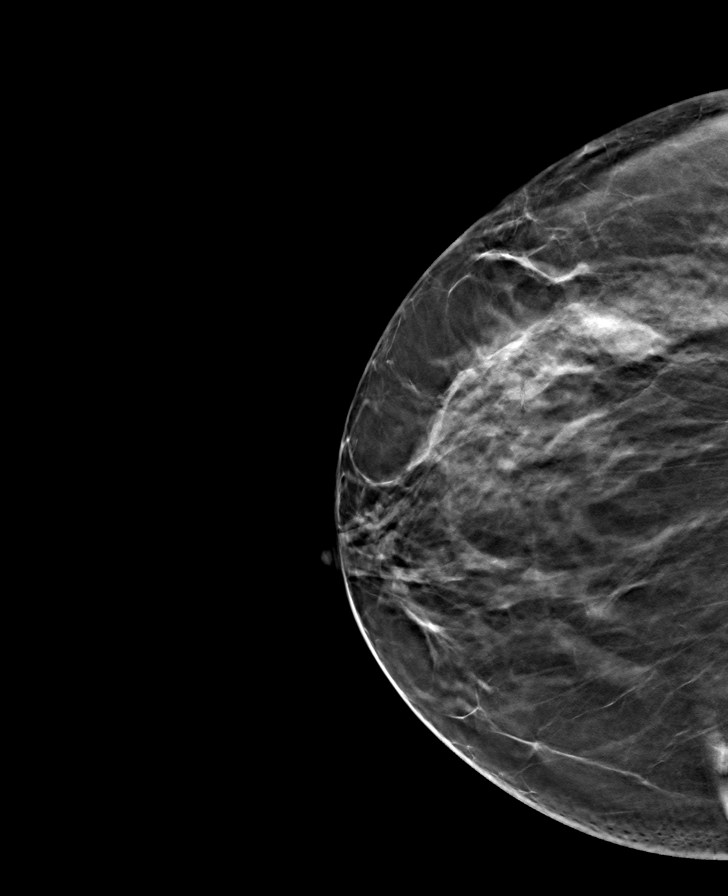
[frame 37/73]
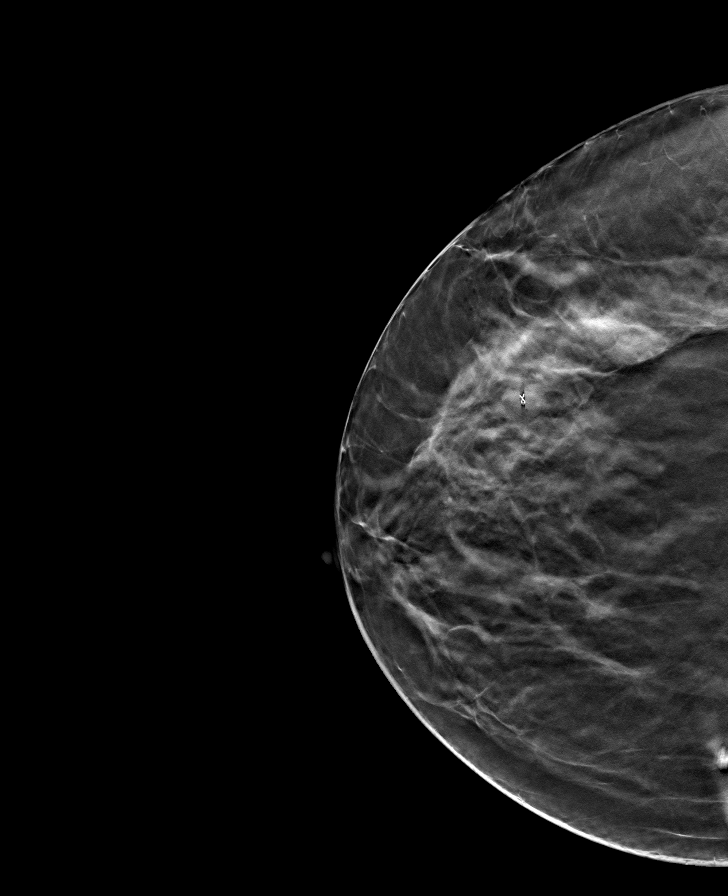

[R MLO tomo · tomo slice 60/119.0]
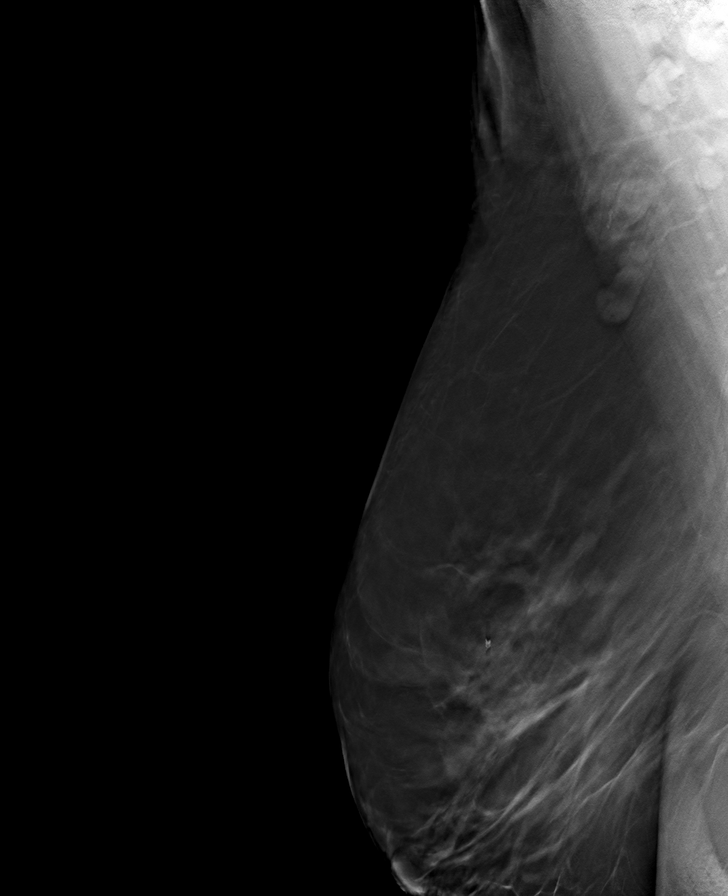

[R ML tomo · tomo slice 41/82.0]
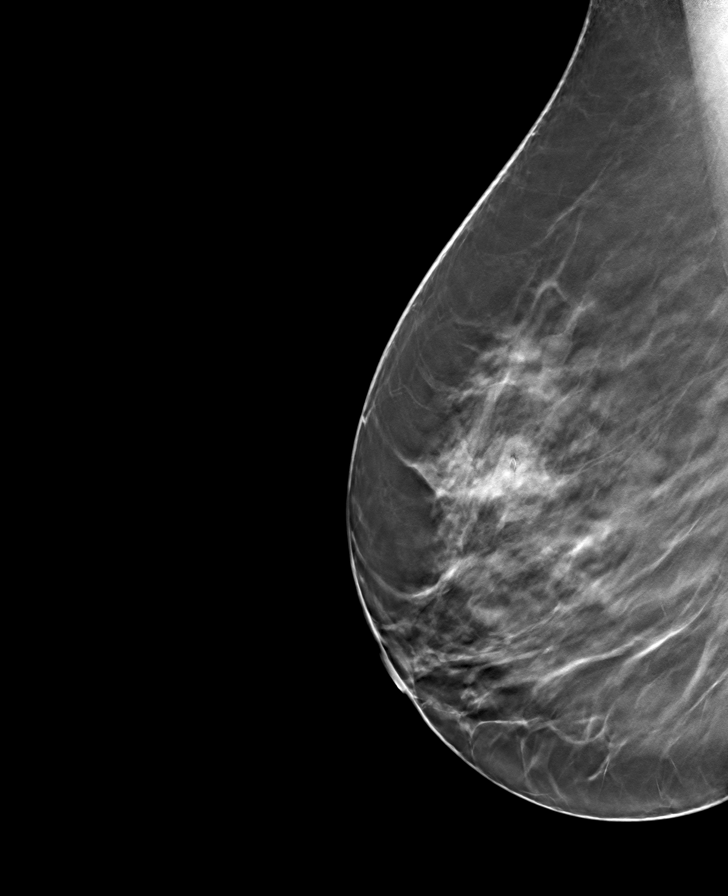

[7 of 18 positions shown; findings below may reference images not displayed]

FINDINGS: I met with the patient and we discussed the procedures of
ultrasound-guided biopsy, including benefits and alternatives. We
discussed the high likelihood of successful procedures. We discussed
the risks of the procedure, including infection, bleeding, tissue
injury, clip migration, and inadequate sampling. Informed written
consent was given. The usual time-out protocol was performed
immediately prior to the procedures.

ULTRASOUND-GUIDED RIGHT BREAST BIOPSY (RIBBON clip):

Lesion quadrant: UPPER-OUTER RIGHT breast

Using sterile technique and 1% Lidocaine as local anesthetic, under
direct ultrasound visualization, a 14 gauge Inges device was
used to perform biopsy of 1.3 cm mass at the 10 o'clock position of
the RIGHT breast using a LATERAL approach. At the conclusion of the
procedure RIBBON tissue marker clip was deployed into the biopsy
cavity.

ULTRASOUND-GUIDED RIGHT AXILLARY LYMPH NODE BIOPSY (HydroMARK clip):

Using sterile technique and 1% Lidocaine as local anesthetic, under
direct ultrasound visualization, a 14 gauge Inges device was
used to perform biopsy of the RIGHT axillary lymph node with
cortical thickening using a LATERAL approach. At the conclusion of
the procedure HydroMARK tissue marker clip was deployed into the
biopsy cavity.

Follow up 2 view mammogram was performed.

The RIBBON clip is in satisfactory position within the UPPER-OUTER
RIGHT breast at the expected site of biopsy.

The HydroMARK clip is in satisfactory position at the site of RIGHT
axillary lymph node biopsy.
IMPRESSION: Ultrasound guided biopsies of 1.3 cm UPPER-OUTER RIGHT breast mass
and enlarged RIGHT axillary lymph node. No apparent complications.

Satisfactory position of RIBBON clip within the UPPER-OUTER RIGHT
breast and satisfactory position of HydroMARK clip within the RIGHT
axilla, at the expected biopsy sites.

ADDENDUM:
PATHOLOGY revealed: A. RIGHT BREAST; ULTRASOUND-GUIDED NEEDLE CORE
BIOPSY: - FIBROEPITHELIAL PROLIFERATION WITH SCLEROSIS; SEE COMMENT.
- NEGATIVE FOR MALIGNANCY.

B. LYMPH NODE, RIGHT AXILLA; ULTRASOUND-GUIDED NEEDLE CORE BIOPSY: -
BENIGN LYMPH NODE. - NEGATIVE FOR MALIGNANCY.

Comment: Diagnostic considerations include (but are not limited to)
a complex fibroadenoma, intraductal papilloma with sclerosis, or
complex sclerosing lesion. Correlation with radiographic findings is
required.

Pathology results are CONCORDANT with imaging findings, per Dr. Samdoval
Manneh.

Pathology results and recommendations were discussed with patient by
telephone on 03/04/2019. Patient reported biopsy site doing well with
slight tenderness at the site. Post biopsy care instructions were
reviewed and questions were answered. Patient was instructed to call
[HOSPITAL] if any concerns or questions arise related to
the biopsy.

Recommendation: Surgical referral given possible intraductal
papilloma or complex sclerosing lesion. Request for surgical
referral was relayed to Gidigasu Balima RT at [HOSPITAL] [REDACTED] by Lizette Noemi RN on 03/04/2019.

Addendum by Lizette Noemi RN on 03/04/2019.

*** End of Addendum ***
FINDINGS: I met with the patient and we discussed the procedures of
ultrasound-guided biopsy, including benefits and alternatives. We
discussed the high likelihood of successful procedures. We discussed
the risks of the procedure, including infection, bleeding, tissue
injury, clip migration, and inadequate sampling. Informed written
consent was given. The usual time-out protocol was performed
immediately prior to the procedures.

ULTRASOUND-GUIDED RIGHT BREAST BIOPSY (RIBBON clip):

Lesion quadrant: UPPER-OUTER RIGHT breast

Using sterile technique and 1% Lidocaine as local anesthetic, under
direct ultrasound visualization, a 14 gauge Inges device was
used to perform biopsy of 1.3 cm mass at the 10 o'clock position of
the RIGHT breast using a LATERAL approach. At the conclusion of the
procedure RIBBON tissue marker clip was deployed into the biopsy
cavity.

ULTRASOUND-GUIDED RIGHT AXILLARY LYMPH NODE BIOPSY (HydroMARK clip):

Using sterile technique and 1% Lidocaine as local anesthetic, under
direct ultrasound visualization, a 14 gauge Inges device was
used to perform biopsy of the RIGHT axillary lymph node with
cortical thickening using a LATERAL approach. At the conclusion of
the procedure HydroMARK tissue marker clip was deployed into the
biopsy cavity.

Follow up 2 view mammogram was performed.

The RIBBON clip is in satisfactory position within the UPPER-OUTER
RIGHT breast at the expected site of biopsy.

The HydroMARK clip is in satisfactory position at the site of RIGHT
axillary lymph node biopsy.
IMPRESSION: Ultrasound guided biopsies of 1.3 cm UPPER-OUTER RIGHT breast mass
and enlarged RIGHT axillary lymph node. No apparent complications.

Satisfactory position of RIBBON clip within the UPPER-OUTER RIGHT
breast and satisfactory position of HydroMARK clip within the RIGHT
axilla, at the expected biopsy sites.

## 2020-06-22 IMAGING — MG US  BREAST BX W/ LOC DEV 1ST LESION IMG BX SPEC US GUIDE*R*
1 series · 7 of 8 positions shown · non-contrast
Comparison: Previous exam(s).
COMPARISON: Previous exam(s).

Addendum:
CLINICAL DATA: 53-year-old female for tissue sampling of
UPPER-OUTER RIGHT breast mass and enlarged RIGHT axillary lymph node

EXAM:
ULTRASOUND GUIDED RIGHT BREAST CORE NEEDLE BIOPSY
ULTRASOUND-GUIDED RIGHT AXILLARY LYMPH NODE BIOPSY
DIAGNOSTIC RIGHT MAMMOGRAM POST ULTRASOUND BIOPSY

[Series 1: MG view · 0.06mm/px · 7 of 50 slices shown]
[im 1/50]
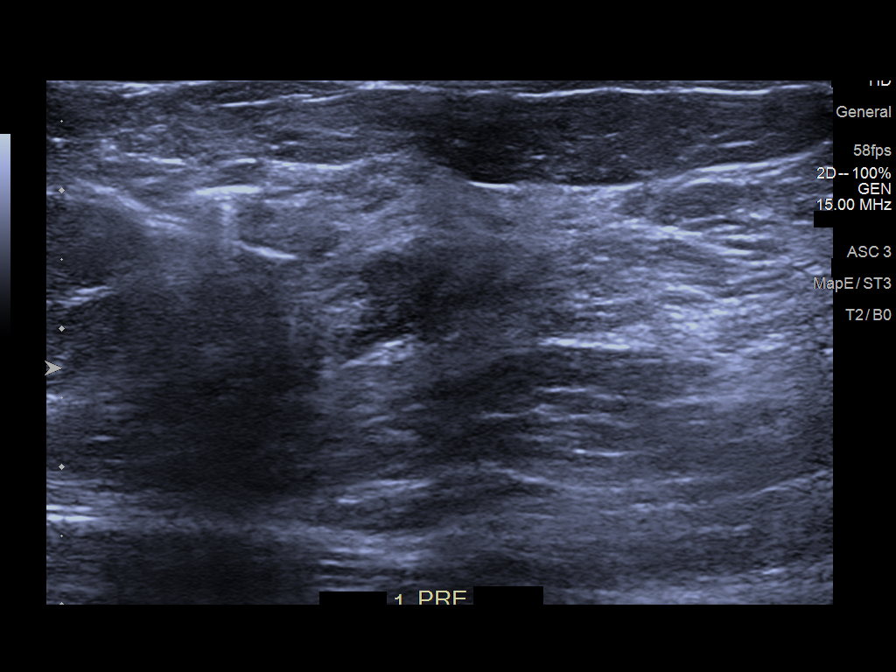
[im 8/50]
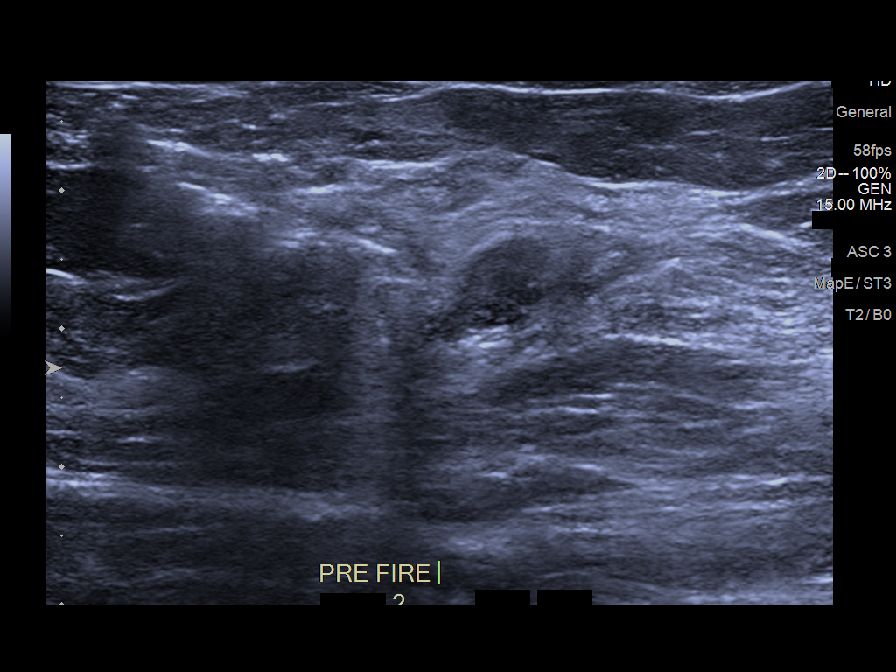
[im 15/50]
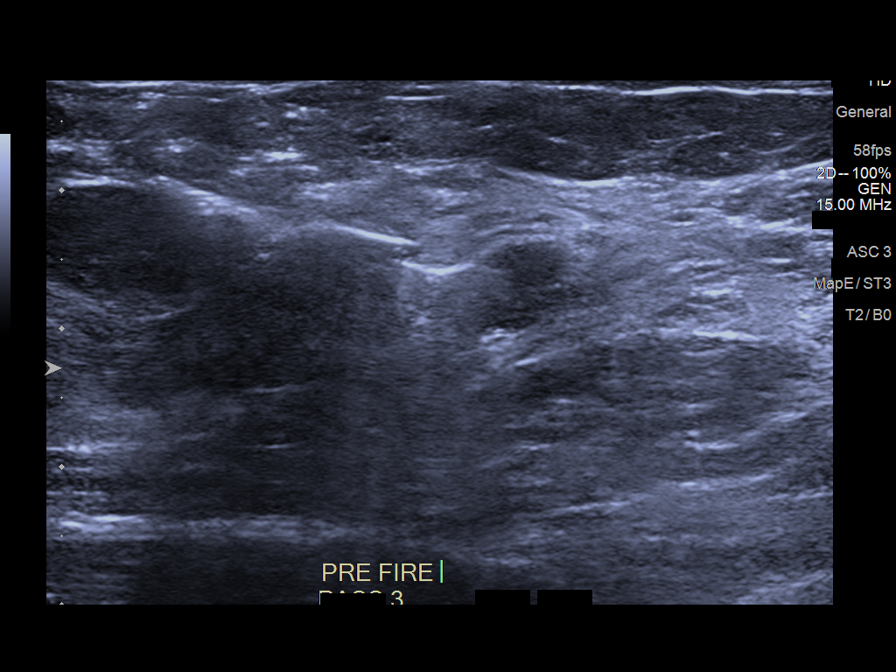
[im 22/50]
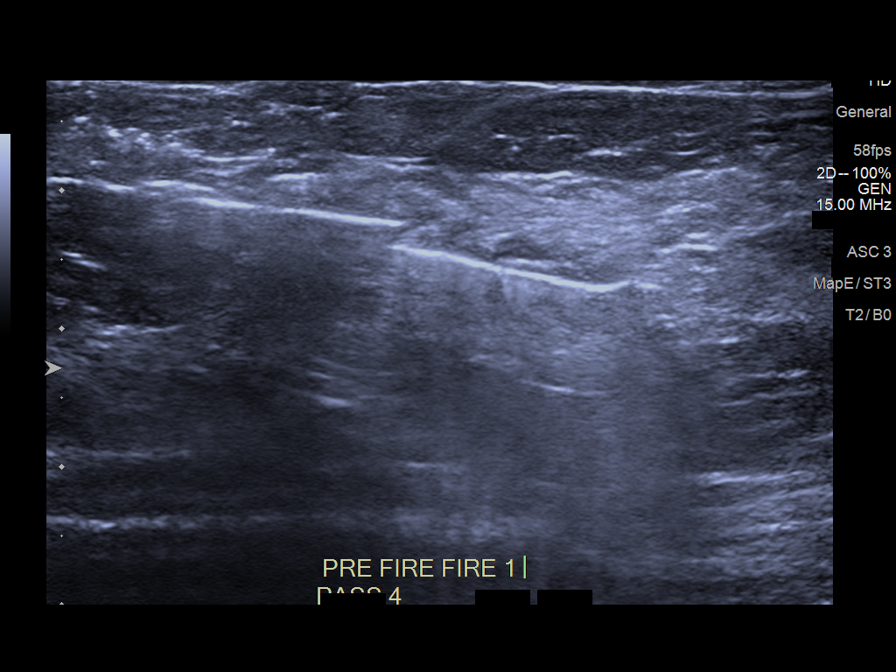
[im 29/50]
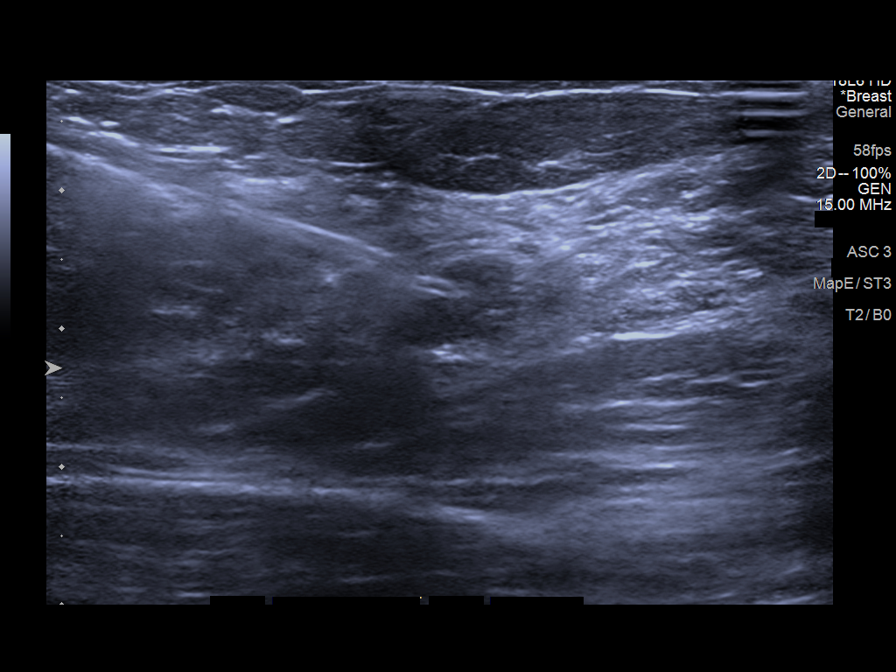
[im 36/50]
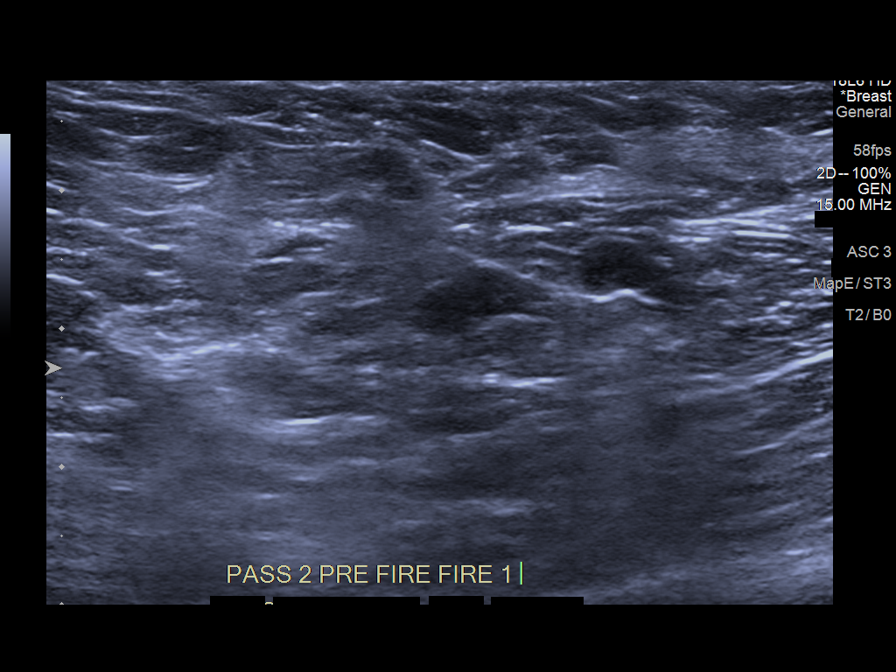
[im 43/50]
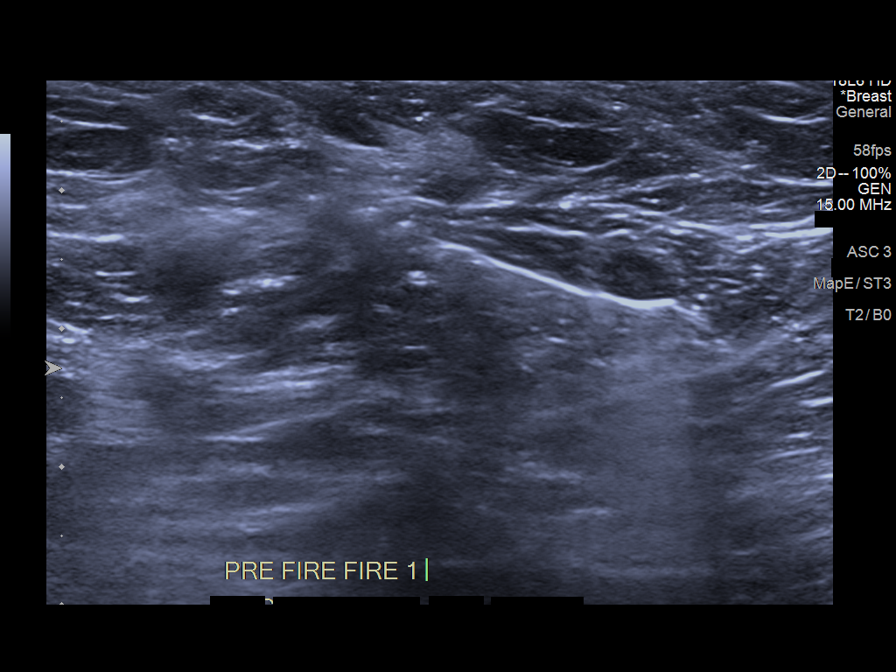

[7 of 8 positions shown; findings below may reference images not displayed]

FINDINGS: I met with the patient and we discussed the procedures of
ultrasound-guided biopsy, including benefits and alternatives. We
discussed the high likelihood of successful procedures. We discussed
the risks of the procedure, including infection, bleeding, tissue
injury, clip migration, and inadequate sampling. Informed written
consent was given. The usual time-out protocol was performed
immediately prior to the procedures.

ULTRASOUND-GUIDED RIGHT BREAST BIOPSY (RIBBON clip):

Lesion quadrant: UPPER-OUTER RIGHT breast

Using sterile technique and 1% Lidocaine as local anesthetic, under
direct ultrasound visualization, a 14 gauge Inges device was
used to perform biopsy of 1.3 cm mass at the 10 o'clock position of
the RIGHT breast using a LATERAL approach. At the conclusion of the
procedure RIBBON tissue marker clip was deployed into the biopsy
cavity.

ULTRASOUND-GUIDED RIGHT AXILLARY LYMPH NODE BIOPSY (HydroMARK clip):

Using sterile technique and 1% Lidocaine as local anesthetic, under
direct ultrasound visualization, a 14 gauge Inges device was
used to perform biopsy of the RIGHT axillary lymph node with
cortical thickening using a LATERAL approach. At the conclusion of
the procedure HydroMARK tissue marker clip was deployed into the
biopsy cavity.

Follow up 2 view mammogram was performed.

The RIBBON clip is in satisfactory position within the UPPER-OUTER
RIGHT breast at the expected site of biopsy.

The HydroMARK clip is in satisfactory position at the site of RIGHT
axillary lymph node biopsy.
IMPRESSION: Ultrasound guided biopsies of 1.3 cm UPPER-OUTER RIGHT breast mass
and enlarged RIGHT axillary lymph node. No apparent complications.

Satisfactory position of RIBBON clip within the UPPER-OUTER RIGHT
breast and satisfactory position of HydroMARK clip within the RIGHT
axilla, at the expected biopsy sites.

ADDENDUM:
PATHOLOGY revealed: A. RIGHT BREAST; ULTRASOUND-GUIDED NEEDLE CORE
BIOPSY: - FIBROEPITHELIAL PROLIFERATION WITH SCLEROSIS; SEE COMMENT.
- NEGATIVE FOR MALIGNANCY.

B. LYMPH NODE, RIGHT AXILLA; ULTRASOUND-GUIDED NEEDLE CORE BIOPSY: -
BENIGN LYMPH NODE. - NEGATIVE FOR MALIGNANCY.

Comment: Diagnostic considerations include (but are not limited to)
a complex fibroadenoma, intraductal papilloma with sclerosis, or
complex sclerosing lesion. Correlation with radiographic findings is
required.

Pathology results are CONCORDANT with imaging findings, per Dr. Samdoval
Manneh.

Pathology results and recommendations were discussed with patient by
telephone on 03/04/2019. Patient reported biopsy site doing well with
slight tenderness at the site. Post biopsy care instructions were
reviewed and questions were answered. Patient was instructed to call
[HOSPITAL] if any concerns or questions arise related to
the biopsy.

Recommendation: Surgical referral given possible intraductal
papilloma or complex sclerosing lesion. Request for surgical
referral was relayed to Gidigasu Balima RT at [HOSPITAL] [REDACTED] by Lizette Noemi RN on 03/04/2019.

Addendum by Lizette Noemi RN on 03/04/2019.

*** End of Addendum ***
FINDINGS: I met with the patient and we discussed the procedures of
ultrasound-guided biopsy, including benefits and alternatives. We
discussed the high likelihood of successful procedures. We discussed
the risks of the procedure, including infection, bleeding, tissue
injury, clip migration, and inadequate sampling. Informed written
consent was given. The usual time-out protocol was performed
immediately prior to the procedures.

ULTRASOUND-GUIDED RIGHT BREAST BIOPSY (RIBBON clip):

Lesion quadrant: UPPER-OUTER RIGHT breast

Using sterile technique and 1% Lidocaine as local anesthetic, under
direct ultrasound visualization, a 14 gauge Inges device was
used to perform biopsy of 1.3 cm mass at the 10 o'clock position of
the RIGHT breast using a LATERAL approach. At the conclusion of the
procedure RIBBON tissue marker clip was deployed into the biopsy
cavity.

ULTRASOUND-GUIDED RIGHT AXILLARY LYMPH NODE BIOPSY (HydroMARK clip):

Using sterile technique and 1% Lidocaine as local anesthetic, under
direct ultrasound visualization, a 14 gauge Inges device was
used to perform biopsy of the RIGHT axillary lymph node with
cortical thickening using a LATERAL approach. At the conclusion of
the procedure HydroMARK tissue marker clip was deployed into the
biopsy cavity.

Follow up 2 view mammogram was performed.

The RIBBON clip is in satisfactory position within the UPPER-OUTER
RIGHT breast at the expected site of biopsy.

The HydroMARK clip is in satisfactory position at the site of RIGHT
axillary lymph node biopsy.
IMPRESSION: Ultrasound guided biopsies of 1.3 cm UPPER-OUTER RIGHT breast mass
and enlarged RIGHT axillary lymph node. No apparent complications.

Satisfactory position of RIBBON clip within the UPPER-OUTER RIGHT
breast and satisfactory position of HydroMARK clip within the RIGHT
axilla, at the expected biopsy sites.
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
## 2020-06-27 ENCOUNTER — Ambulatory Visit: Payer: BC Managed Care – PPO | Admitting: Family Medicine

## 2020-06-27 ENCOUNTER — Other Ambulatory Visit: Payer: Self-pay

## 2020-06-27 ENCOUNTER — Encounter: Payer: Self-pay | Admitting: Family Medicine

## 2020-06-27 VITALS — BP 116/80 | HR 92 | Temp 98.3°F | Resp 16 | Ht 67.0 in | Wt 215.1 lb

## 2020-06-27 DIAGNOSIS — I10 Essential (primary) hypertension: Secondary | ICD-10-CM

## 2020-06-27 DIAGNOSIS — Z1211 Encounter for screening for malignant neoplasm of colon: Secondary | ICD-10-CM

## 2020-06-27 DIAGNOSIS — Z23 Encounter for immunization: Secondary | ICD-10-CM

## 2020-06-27 DIAGNOSIS — E785 Hyperlipidemia, unspecified: Secondary | ICD-10-CM

## 2020-06-27 DIAGNOSIS — E669 Obesity, unspecified: Secondary | ICD-10-CM

## 2020-06-27 DIAGNOSIS — R7303 Prediabetes: Secondary | ICD-10-CM | POA: Diagnosis not present

## 2020-06-27 DIAGNOSIS — E559 Vitamin D deficiency, unspecified: Secondary | ICD-10-CM

## 2020-06-27 DIAGNOSIS — F419 Anxiety disorder, unspecified: Secondary | ICD-10-CM

## 2020-06-27 DIAGNOSIS — R768 Other specified abnormal immunological findings in serum: Secondary | ICD-10-CM

## 2020-06-27 DIAGNOSIS — F3340 Major depressive disorder, recurrent, in remission, unspecified: Secondary | ICD-10-CM

## 2020-06-27 MED ORDER — INSULIN PEN NEEDLE 30G X 8 MM MISC: 1.0000 | 0 refills | Status: DC | PRN

## 2020-06-27 MED ORDER — ROSUVASTATIN CALCIUM 20 MG PO TABS: 20.0000 mg | ORAL_TABLET | Freq: Every day | ORAL | 1 refills | Status: DC

## 2020-06-27 MED ORDER — SAXENDA 18 MG/3ML ~~LOC~~ SOPN: 0.6000 mg | PEN_INJECTOR | Freq: Every day | SUBCUTANEOUS | 0 refills | Status: DC

## 2020-07-16 ENCOUNTER — Encounter: Payer: Self-pay | Admitting: Family Medicine

## 2020-08-09 ENCOUNTER — Other Ambulatory Visit: Payer: Self-pay | Admitting: Family Medicine

## 2020-08-09 DIAGNOSIS — E669 Obesity, unspecified: Secondary | ICD-10-CM

## 2020-08-09 NOTE — Telephone Encounter (Signed)
Requested Prescriptions  Pending Prescriptions Disp Refills  . SAXENDA 18 MG/3ML SOPN [Pharmacy Med Name: SAXENDA 18 MG/3 ML PEN] 15 mL 0    Sig: INJECT 0.6-3 MG INTO THE SKIN DAILY.     Endocrinology:  Diabetes - GLP-1 Receptor Agonists Passed - 08/09/2020  1:40 AM      Passed - HBA1C is between 0 and 7.9 and within 180 days    Hemoglobin A1C  Date Value Ref Range Status  03/29/2020 6.0 (A) 4.0 - 5.6 % Final   Hgb A1c MFr Bld  Date Value Ref Range Status  07/12/2019 6.0 (H) <5.7 % of total Hgb Final    Comment:    For someone without known diabetes, a hemoglobin  A1c value between 5.7% and 6.4% is consistent with prediabetes and should be confirmed with a  follow-up test. . For someone with known diabetes, a value <7% indicates that their diabetes is well controlled. A1c targets should be individualized based on duration of diabetes, age, comorbid conditions, and other considerations. . This assay result is consistent with an increased risk of diabetes. . Currently, no consensus exists regarding use of hemoglobin A1c for diagnosis of diabetes for children. Renella Cunas - Valid encounter within last 6 months    Recent Outpatient Visits          1 month ago MDD (recurrent major depressive disorder) in remission [facility])   [facility] Steele Sizer, MD   4 months ago Pre-diabetes   [facility] Steele Sizer, MD   7 months ago Encounter for examination following treatment at hospital   [place], [place]   7 months ago ANA positive   [place] Steele Sizer, MD   11 months ago ANA positive   [facility] Steele Sizer, MD      Future Appointments            In 3 months Ancil Boozer, Drue Stager, MD [facility], [place]   In 4 months Steele Sizer, MD [facility], [facility]

## 2020-08-26 ENCOUNTER — Other Ambulatory Visit: Payer: Self-pay | Admitting: Obstetrics and Gynecology

## 2020-08-26 DIAGNOSIS — R7303 Prediabetes: Secondary | ICD-10-CM

## 2020-10-02 ENCOUNTER — Other Ambulatory Visit: Payer: Self-pay | Admitting: Family Medicine

## 2020-10-02 DIAGNOSIS — F3340 Major depressive disorder, recurrent, in remission, unspecified: Secondary | ICD-10-CM

## 2020-10-02 DIAGNOSIS — F419 Anxiety disorder, unspecified: Secondary | ICD-10-CM

## 2020-10-02 NOTE — Telephone Encounter (Signed)
Pt has an appt on 11/07/20

## 2020-10-02 NOTE — Telephone Encounter (Signed)
   Notes to clinic:  DX Code Needed     Requested Prescriptions  Pending Prescriptions Disp Refills   escitalopram (LEXAPRO) 10 MG tablet [Pharmacy Med Name: ESCITALOPRAM 10 MG TABLET] 90 tablet 1    Sig: TAKE 1 TABLET BY MOUTH EVERY DAY      Psychiatry:  Antidepressants - SSRI Passed - 10/02/2020  9:20 AM      Passed - Completed PHQ-2 or PHQ-9 in the last 360 days      Passed - Valid encounter within last 6 months    Recent Outpatient Visits           3 months ago MDD (recurrent major depressive disorder) in remission [facility])   [facility] Steele Sizer, MD   6 months ago Pre-diabetes   [facility] Steele Sizer, MD   9 months ago Encounter for examination following treatment at hospital   [place], [place]   9 months ago ANA positive   [facility] Steele Sizer, MD   1 year ago ANA positive   [facility] Steele Sizer, MD       Future Appointments             In 1 month Steele Sizer, MD [facility], [place]   In 2 months Steele Sizer, MD [facility], [facility]

## 2020-10-08 ENCOUNTER — Other Ambulatory Visit: Payer: Self-pay | Admitting: Family Medicine

## 2020-10-08 DIAGNOSIS — I10 Essential (primary) hypertension: Secondary | ICD-10-CM

## 2020-11-05 ENCOUNTER — Other Ambulatory Visit: Payer: Self-pay | Admitting: Family Medicine

## 2020-11-05 DIAGNOSIS — E669 Obesity, unspecified: Secondary | ICD-10-CM

## 2020-11-05 NOTE — Telephone Encounter (Signed)
PA appeal has been initiated, awaiting response.

## 2020-11-06 NOTE — Patient Instructions (Signed)
Preventive Care 40-55 Years Old, Female Preventive care refers to lifestyle choices and visits with your health care provider that can promote health and wellness. This includes: A yearly physical exam. This is also called an annual wellness visit. Regular dental and eye exams. Immunizations. Screening for certain conditions. Healthy lifestyle choices, such as: Eating a healthy diet. Getting regular exercise. Not using drugs or products that contain nicotine and tobacco. Limiting alcohol use. What can I expect for my preventive care visit? Physical exam Your health care provider will check your: Height and weight. These may be used to calculate your BMI (body mass index). BMI is a measurement that tells if you are at a healthy weight. Heart rate and blood pressure. Body temperature. Skin for abnormal spots. Counseling Your health care provider may ask you questions about your: Past medical problems. Family's medical history. Alcohol, tobacco, and drug use. Emotional well-being. Home life and relationship well-being. Sexual activity. Diet, exercise, and sleep habits. Work and work environment. Access to firearms. Method of birth control. Menstrual cycle. Pregnancy history. What immunizations do I need? Vaccines are usually given at various ages, according to a schedule. Your health care provider will recommend vaccines for you based on your age, medical history, and lifestyle or other factors, such as travel or where you work. What tests do I need? Blood tests Lipid and cholesterol levels. These may be checked every 5 years, or more often if you are over 50 years old. Hepatitis C test. Hepatitis B test. Screening Lung cancer screening. You may have this screening every year starting at age 55 if you have a 30-pack-year history of smoking and currently smoke or have quit within the past 15 years. Colorectal cancer screening. All adults should have this screening starting at  age 50 and continuing until age 75. Your health care provider may recommend screening at age 45 if you are at increased risk. You will have tests every 1-10 years, depending on your results and the type of screening test. Diabetes screening. This is done by checking your blood sugar (glucose) after you have not eaten for a while (fasting). You may have this done every 1-3 years. Mammogram. This may be done every 1-2 years. Talk with your health care provider about when you should start having regular mammograms. This may depend on whether you have a family history of breast cancer. BRCA-related cancer screening. This may be done if you have a family history of breast, ovarian, tubal, or peritoneal cancers. Pelvic exam and Pap test. This may be done every 3 years starting at age 21. Starting at age 30, this may be done every 5 years if you have a Pap test in combination with an HPV test. Other tests STD (sexually transmitted disease) testing, if you are at risk. Bone density scan. This is done to screen for osteoporosis. You may have this scan if you are at high risk for osteoporosis. Talk with your health care provider about your test results, treatment options, and if necessary, the need for more tests. Follow these instructions at home: Eating and drinking  Eat a diet that includes fresh fruits and vegetables, whole grains, lean protein, and low-fat dairy products. Take vitamin and mineral supplements as recommended by your health care provider. Do not drink alcohol if: Your health care provider tells you not to drink. You are pregnant, may be pregnant, or are planning to become pregnant. If you drink alcohol: Limit how much you have to 0-1 drink a day. Be   aware of how much alcohol is in your drink. In the [place] one drink equals one 12 oz bottle of beer (355 mL), one 5 oz glass of wine (148 mL), or one 1 oz glass of hard liquor (44 mL). Lifestyle Take daily care of your teeth and  gums. Brush your teeth every morning and night with fluoride toothpaste. Floss one time each day. Stay active. Exercise for at least 30 minutes 5 or more days each week. Do not use any products that contain nicotine or tobacco, such as cigarettes, e-cigarettes, and chewing tobacco. If you need help quitting, ask your health care provider. Do not use drugs. If you are sexually active, practice safe sex. Use a condom or other form of protection to prevent STIs (sexually transmitted infections). If you do not wish to become pregnant, use a form of birth control. If you plan to become pregnant, see your health care provider for a prepregnancy visit. If told by your health care provider, take low-dose aspirin daily starting at age 63. Find healthy ways to cope with stress, such as: Meditation, yoga, or listening to music. Journaling. Talking to a trusted person. Spending time with friends and family. Safety Always wear your seat belt while driving or riding in a vehicle. Do not drive: If you have been drinking alcohol. Do not ride with someone who has been drinking. When you are tired or distracted. While texting. Wear a helmet and other protective equipment during sports activities. If you have firearms in your house, make sure you follow all gun safety procedures. What's next? Visit your health care provider once a year for an annual wellness visit. Ask your health care provider how often you should have your eyes and teeth checked. Stay up to date on all vaccines. This information is not intended to replace advice given to you by your health care provider. Make sure you discuss any questions you have with your health care provider. Document Revised: 04/20/2020 Document Reviewed: 10/22/2017 Elsevier Patient Education  2022 [employer].
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
## 2020-11-07 ENCOUNTER — Other Ambulatory Visit: Payer: Self-pay

## 2020-11-07 ENCOUNTER — Ambulatory Visit (INDEPENDENT_AMBULATORY_CARE_PROVIDER_SITE_OTHER): Payer: BC Managed Care – PPO | Admitting: Family Medicine

## 2020-11-07 ENCOUNTER — Encounter: Payer: Self-pay | Admitting: Family Medicine

## 2020-11-07 ENCOUNTER — Other Ambulatory Visit: Payer: Self-pay | Admitting: Family Medicine

## 2020-11-07 VITALS — BP 118/78 | HR 77 | Temp 98.3°F | Resp 16 | Ht 67.0 in | Wt 209.0 lb

## 2020-11-07 DIAGNOSIS — Z1231 Encounter for screening mammogram for malignant neoplasm of breast: Secondary | ICD-10-CM

## 2020-11-07 DIAGNOSIS — I10 Essential (primary) hypertension: Secondary | ICD-10-CM | POA: Diagnosis not present

## 2020-11-07 DIAGNOSIS — D500 Iron deficiency anemia secondary to blood loss (chronic): Secondary | ICD-10-CM | POA: Diagnosis not present

## 2020-11-07 DIAGNOSIS — E559 Vitamin D deficiency, unspecified: Secondary | ICD-10-CM

## 2020-11-07 DIAGNOSIS — R7303 Prediabetes: Secondary | ICD-10-CM

## 2020-11-07 DIAGNOSIS — Z0000 Encounter for general adult medical examination without abnormal findings: Secondary | ICD-10-CM | POA: Diagnosis not present

## 2020-11-07 DIAGNOSIS — E785 Hyperlipidemia, unspecified: Secondary | ICD-10-CM

## 2020-11-07 DIAGNOSIS — Z113 Encounter for screening for infections with a predominantly sexual mode of transmission: Secondary | ICD-10-CM

## 2020-11-07 DIAGNOSIS — Z23 Encounter for immunization: Secondary | ICD-10-CM

## 2020-11-08 LAB — COMPLETE METABOLIC PANEL WITH GFR
AG Ratio: 1.4 (calc) (ref 1.0–2.5)
ALT: 14 U/L (ref 6–29)
AST: 16 U/L (ref 10–35)
Albumin: 4.4 g/dL (ref 3.6–5.1)
Alkaline phosphatase (APISO): 54 U/L (ref 37–153)
BUN: 12 mg/dL (ref 7–25)
CO2: 30 mmol/L (ref 20–32)
Calcium: 9.7 mg/dL (ref 8.6–10.4)
Chloride: 106 mmol/L (ref 98–110)
Creat: 0.72 mg/dL (ref 0.50–1.03)
Globulin: 3.2 g/dL (calc) (ref 1.9–3.7)
Glucose, Bld: 86 mg/dL (ref 65–99)
Potassium: 5.1 mmol/L (ref 3.5–5.3)
Sodium: 142 mmol/L (ref 135–146)
Total Bilirubin: 0.4 mg/dL (ref 0.2–1.2)
Total Protein: 7.6 g/dL (ref 6.1–8.1)
eGFR: 99 mL/min/{1.73_m2} (ref >=60)

## 2020-11-08 LAB — HEMOGLOBIN A1C
Hgb A1c MFr Bld: 5.9 % of total Hgb — ABNORMAL HIGH (ref <5.7)
Mean Plasma Glucose: 123 mg/dL
eAG (mmol/L): 6.8 mmol/L

## 2020-11-08 LAB — CBC WITH DIFFERENTIAL/PLATELET
Absolute Monocytes: 349 cells/uL (ref 200–950)
Basophils Absolute: 11 cells/uL (ref 0–200)
Basophils Relative: 0.3 %
Eosinophils Absolute: 108 cells/uL (ref 15–500)
Eosinophils Relative: 3 %
HCT: 41.3 % (ref 35.0–45.0)
Hemoglobin: 12.6 g/dL (ref 11.7–15.5)
Lymphs Abs: 1667 cells/uL (ref 850–3900)
MCH: 25.7 pg — ABNORMAL LOW (ref 27.0–33.0)
MCHC: 30.5 g/dL — ABNORMAL LOW (ref 32.0–36.0)
MCV: 84.3 fL (ref 80.0–100.0)
MPV: 10 fL (ref 7.5–12.5)
Monocytes Relative: 9.7 %
Neutro Abs: 1465 cells/uL — ABNORMAL LOW (ref 1500–7800)
Neutrophils Relative %: 40.7 %
Platelets: 326 10*3/uL (ref 140–400)
RBC: 4.9 10*6/uL (ref 3.80–5.10)
RDW: 14 % (ref 11.0–15.0)
Total Lymphocyte: 46.3 %
WBC: 3.6 10*3/uL — ABNORMAL LOW (ref 3.8–10.8)

## 2020-11-08 LAB — LIPID PANEL
Cholesterol: 172 mg/dL (ref <200)
HDL: 60 mg/dL (ref >=50)
LDL Cholesterol (Calc): 93 mg/dL (calc)
Non-HDL Cholesterol (Calc): 112 mg/dL (calc) (ref <130)
Total CHOL/HDL Ratio: 2.9 (calc) (ref <5.0)
Triglycerides: 101 mg/dL (ref <150)

## 2020-11-08 LAB — IRON,TIBC AND FERRITIN PANEL
%SAT: 23 % (calc) (ref 16–45)
Ferritin: 66 ng/mL (ref 16–232)
Iron: 81 ug/dL (ref 45–160)
TIBC: 352 mcg/dL (calc) (ref 250–450)

## 2020-11-08 LAB — HIV ANTIBODY (ROUTINE TESTING W REFLEX): HIV 1&2 Ab, 4th Generation: NONREACTIVE

## 2020-11-08 LAB — VITAMIN D 25 HYDROXY (VIT D DEFICIENCY, FRACTURES): Vit D, 25-Hydroxy: 51 ng/mL (ref 30–100)

## 2020-11-08 LAB — RPR: RPR Ser Ql: NONREACTIVE

## 2020-11-19 ENCOUNTER — Other Ambulatory Visit: Payer: Self-pay | Admitting: Obstetrics and Gynecology

## 2020-11-19 DIAGNOSIS — R7303 Prediabetes: Secondary | ICD-10-CM

## 2020-12-03 ENCOUNTER — Encounter: Payer: Self-pay | Admitting: General Surgery

## 2020-12-27 ENCOUNTER — Other Ambulatory Visit: Payer: Self-pay

## 2020-12-27 ENCOUNTER — Ambulatory Visit
Admission: RE | Admit: 2020-12-27 | Discharge: 2020-12-27 | Disposition: A | Discharge status: Home / Self Care | Payer: BC Managed Care – PPO | Source: Ambulatory Visit | Attending: Family Medicine | Admitting: Family Medicine

## 2020-12-27 DIAGNOSIS — Z1231 Encounter for screening mammogram for malignant neoplasm of breast: Secondary | ICD-10-CM | POA: Insufficient documentation

## 2020-12-28 ENCOUNTER — Ambulatory Visit: Payer: BC Managed Care – PPO | Admitting: Family Medicine

## 2020-12-28 ENCOUNTER — Encounter: Payer: Self-pay | Admitting: Family Medicine

## 2020-12-28 VITALS — BP 116/68 | HR 94 | Temp 98.5°F | Resp 16 | Ht 67.0 in | Wt 209.0 lb

## 2020-12-28 DIAGNOSIS — F3340 Major depressive disorder, recurrent, in remission, unspecified: Secondary | ICD-10-CM | POA: Diagnosis not present

## 2020-12-28 DIAGNOSIS — F419 Anxiety disorder, unspecified: Secondary | ICD-10-CM | POA: Diagnosis not present

## 2020-12-28 DIAGNOSIS — E669 Obesity, unspecified: Secondary | ICD-10-CM

## 2020-12-28 DIAGNOSIS — K219 Gastro-esophageal reflux disease without esophagitis: Secondary | ICD-10-CM

## 2020-12-28 DIAGNOSIS — E785 Hyperlipidemia, unspecified: Secondary | ICD-10-CM

## 2020-12-28 DIAGNOSIS — E559 Vitamin D deficiency, unspecified: Secondary | ICD-10-CM

## 2020-12-28 DIAGNOSIS — F410 Panic disorder [episodic paroxysmal anxiety] without agoraphobia: Secondary | ICD-10-CM

## 2020-12-28 DIAGNOSIS — R7303 Prediabetes: Secondary | ICD-10-CM

## 2020-12-28 MED ORDER — ROSUVASTATIN CALCIUM 20 MG PO TABS: 20.0000 mg | ORAL_TABLET | Freq: Every day | ORAL | 1 refills | Status: DC

## 2020-12-28 MED ORDER — BUSPIRONE HCL 5 MG PO TABS: 5.0000 mg | ORAL_TABLET | Freq: Two times a day (BID) | ORAL | 2 refills | Status: DC

## 2020-12-28 MED ORDER — ESCITALOPRAM OXALATE 10 MG PO TABS: 10.0000 mg | ORAL_TABLET | Freq: Every day | ORAL | 1 refills | Status: DC
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
## 2020-12-29 ENCOUNTER — Encounter: Payer: Self-pay | Admitting: Family Medicine

## 2021-01-03 DIAGNOSIS — F419 Anxiety disorder, unspecified: Secondary | ICD-10-CM | POA: Diagnosis not present

## 2021-01-20 ENCOUNTER — Encounter: Payer: Self-pay | Admitting: Family Medicine

## 2021-01-21 ENCOUNTER — Other Ambulatory Visit: Payer: Self-pay

## 2021-01-21 ENCOUNTER — Other Ambulatory Visit: Payer: Self-pay | Admitting: Family Medicine

## 2021-01-21 DIAGNOSIS — F3340 Major depressive disorder, recurrent, in remission, unspecified: Secondary | ICD-10-CM

## 2021-01-21 DIAGNOSIS — F419 Anxiety disorder, unspecified: Secondary | ICD-10-CM

## 2021-01-21 DIAGNOSIS — R7303 Prediabetes: Secondary | ICD-10-CM

## 2021-01-21 DIAGNOSIS — E669 Obesity, unspecified: Secondary | ICD-10-CM

## 2021-01-21 DIAGNOSIS — I10 Essential (primary) hypertension: Secondary | ICD-10-CM

## 2021-01-21 DIAGNOSIS — E559 Vitamin D deficiency, unspecified: Secondary | ICD-10-CM

## 2021-01-21 DIAGNOSIS — F410 Panic disorder [episodic paroxysmal anxiety] without agoraphobia: Secondary | ICD-10-CM

## 2021-01-21 DIAGNOSIS — E785 Hyperlipidemia, unspecified: Secondary | ICD-10-CM

## 2021-01-21 MED ORDER — INSULIN PEN NEEDLE 30G X 8 MM MISC: 1.0000 | 0 refills | Status: DC | PRN

## 2021-01-21 MED ORDER — ROSUVASTATIN CALCIUM 20 MG PO TABS: 20.0000 mg | ORAL_TABLET | Freq: Every day | ORAL | 0 refills | Status: DC

## 2021-01-21 MED ORDER — ESCITALOPRAM OXALATE 10 MG PO TABS: 10.0000 mg | ORAL_TABLET | Freq: Every day | ORAL | 0 refills | Status: DC

## 2021-01-21 MED ORDER — VITAMIN D 50 MCG (2000 UT) PO CAPS: 1.0000 | ORAL_CAPSULE | Freq: Every day | ORAL | 0 refills | Status: AC

## 2021-01-21 MED ORDER — BUSPIRONE HCL 5 MG PO TABS: 5.0000 mg | ORAL_TABLET | Freq: Two times a day (BID) | ORAL | 0 refills | Status: DC

## 2021-01-21 MED ORDER — METFORMIN HCL ER 500 MG PO TB24: ORAL_TABLET | ORAL | 0 refills | Status: DC

## 2021-01-21 MED ORDER — AMLODIPINE BESYLATE 10 MG PO TABS: 10.0000 mg | ORAL_TABLET | Freq: Every day | ORAL | 0 refills | Status: DC

## 2021-01-21 MED ORDER — SAXENDA 18 MG/3ML ~~LOC~~ SOPN: 0.6000 mg | PEN_INJECTOR | Freq: Every day | SUBCUTANEOUS | 0 refills | Status: DC

## 2021-01-22 ENCOUNTER — Other Ambulatory Visit: Payer: Self-pay | Admitting: Family Medicine

## 2021-01-22 DIAGNOSIS — I10 Essential (primary) hypertension: Secondary | ICD-10-CM

## 2021-01-22 DIAGNOSIS — E669 Obesity, unspecified: Secondary | ICD-10-CM

## 2021-03-01 ENCOUNTER — Telehealth: Payer: Self-pay

## 2021-03-01 NOTE — Telephone Encounter (Signed)
Copied from [place] [phone]. Topic: General - Other >> Mar 01, 2021  2:04 PM Fields, Museum/gallery conservator R wrote: Reason for CRM: Bri from cover my meds calling to check on status of appeal, says that pt needs to sign it as well

## 2021-03-04 NOTE — Telephone Encounter (Signed)
PAs and appeals completed and denied on the following: 01/29/2021, 01/21/2021, 01/22/2021, 10/30/2020, 10/20/2020. Insurance will not cover due to past failure/discontinued use of medication of the same year.

## 2021-03-20 NOTE — Telephone Encounter (Signed)
Catherine Christensen from [employer] my meds called and stated that [name] with [employer] [place] advised her that they no record of any PA appeals or denials / Catherine Christensen was giving a courtesy call / please advise

## 2021-03-21 NOTE — Telephone Encounter (Signed)
Per Rhae Lerner  at [facility]: "It looks like our team was calling to advise that they reached out to the plan who stated they do not have an appeal or a denied PA on file for this medication - they advised reaching out to the plan directly for next best steps."  [employer]: [phone]), will contact to get everything faxed over.

## 2021-03-21 NOTE — Telephone Encounter (Signed)
Tried to call Ebony Hail back to get a fax number at [facility]. On hold w/no answer.   10/20/20 (Key: [id number])- Denied 01/21/21 (Key: [id number])- Denied 02/07/21 (Key: [id number])- Denied
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
## 2021-03-27 ENCOUNTER — Encounter: Payer: Self-pay | Admitting: Family Medicine

## 2021-03-27 ENCOUNTER — Ambulatory Visit: Payer: BC Managed Care – PPO | Admitting: Family Medicine

## 2021-03-27 VITALS — BP 120/62 | HR 98 | Temp 98.2°F | Resp 16 | Ht 67.0 in | Wt 216.8 lb

## 2021-03-27 DIAGNOSIS — E785 Hyperlipidemia, unspecified: Secondary | ICD-10-CM

## 2021-03-27 DIAGNOSIS — F410 Panic disorder [episodic paroxysmal anxiety] without agoraphobia: Secondary | ICD-10-CM

## 2021-03-27 DIAGNOSIS — F419 Anxiety disorder, unspecified: Secondary | ICD-10-CM

## 2021-03-27 DIAGNOSIS — Z23 Encounter for immunization: Secondary | ICD-10-CM

## 2021-03-27 DIAGNOSIS — E669 Obesity, unspecified: Secondary | ICD-10-CM

## 2021-03-27 DIAGNOSIS — R7303 Prediabetes: Secondary | ICD-10-CM

## 2021-03-27 DIAGNOSIS — F3340 Major depressive disorder, recurrent, in remission, unspecified: Secondary | ICD-10-CM | POA: Diagnosis not present

## 2021-03-27 DIAGNOSIS — I10 Essential (primary) hypertension: Secondary | ICD-10-CM

## 2021-03-27 MED ORDER — ESCITALOPRAM OXALATE 10 MG PO TABS: 10.0000 mg | ORAL_TABLET | Freq: Every day | ORAL | 0 refills | Status: DC

## 2021-03-27 MED ORDER — BUSPIRONE HCL 5 MG PO TABS: 5.0000 mg | ORAL_TABLET | Freq: Every day | ORAL | 0 refills | Status: DC

## 2021-03-27 MED ORDER — SAXENDA 18 MG/3ML ~~LOC~~ SOPN: 0.6000 mg | PEN_INJECTOR | Freq: Every day | SUBCUTANEOUS | 0 refills | Status: DC

## 2021-03-27 MED ORDER — METFORMIN HCL ER 500 MG PO TB24: ORAL_TABLET | ORAL | 0 refills | Status: DC

## 2021-03-27 MED ORDER — ROSUVASTATIN CALCIUM 20 MG PO TABS: 20.0000 mg | ORAL_TABLET | Freq: Every day | ORAL | 0 refills | Status: DC

## 2021-03-27 MED ORDER — AMLODIPINE BESYLATE 10 MG PO TABS: 10.0000 mg | ORAL_TABLET | Freq: Every day | ORAL | 0 refills | Status: DC
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
## 2021-06-26 ENCOUNTER — Encounter: Payer: Self-pay | Admitting: Family Medicine

## 2021-06-26 ENCOUNTER — Ambulatory Visit (INDEPENDENT_AMBULATORY_CARE_PROVIDER_SITE_OTHER): Payer: BC Managed Care – PPO | Admitting: Family Medicine

## 2021-06-26 VITALS — BP 122/64 | HR 80 | Temp 97.7°F | Resp 16 | Ht 67.0 in | Wt 218.2 lb

## 2021-06-26 DIAGNOSIS — I10 Essential (primary) hypertension: Secondary | ICD-10-CM

## 2021-06-26 DIAGNOSIS — F410 Panic disorder [episodic paroxysmal anxiety] without agoraphobia: Secondary | ICD-10-CM | POA: Diagnosis not present

## 2021-06-26 DIAGNOSIS — Z1211 Encounter for screening for malignant neoplasm of colon: Secondary | ICD-10-CM

## 2021-06-26 DIAGNOSIS — F419 Anxiety disorder, unspecified: Secondary | ICD-10-CM | POA: Diagnosis not present

## 2021-06-26 DIAGNOSIS — F3340 Major depressive disorder, recurrent, in remission, unspecified: Secondary | ICD-10-CM | POA: Diagnosis not present

## 2021-06-26 DIAGNOSIS — E785 Hyperlipidemia, unspecified: Secondary | ICD-10-CM

## 2021-06-26 DIAGNOSIS — R7303 Prediabetes: Secondary | ICD-10-CM

## 2021-06-26 MED ORDER — ESCITALOPRAM OXALATE 10 MG PO TABS: 10.0000 mg | ORAL_TABLET | Freq: Every day | ORAL | 1 refills | Status: DC

## 2021-06-26 MED ORDER — ROSUVASTATIN CALCIUM 20 MG PO TABS: 20.0000 mg | ORAL_TABLET | Freq: Every day | ORAL | 1 refills | Status: DC

## 2021-06-26 MED ORDER — AMLODIPINE BESYLATE 10 MG PO TABS: 10.0000 mg | ORAL_TABLET | Freq: Every day | ORAL | 1 refills | Status: DC

## 2021-06-26 MED ORDER — BUSPIRONE HCL 5 MG PO TABS: 5.0000 mg | ORAL_TABLET | Freq: Every day | ORAL | 1 refills | Status: DC

## 2021-06-26 MED ORDER — METFORMIN HCL ER 500 MG PO TB24: ORAL_TABLET | ORAL | 1 refills | Status: DC

## 2021-11-12 ENCOUNTER — Encounter: Payer: BC Managed Care – PPO | Admitting: Family Medicine

## 2022-01-01 ENCOUNTER — Ambulatory Visit: Payer: BC Managed Care – PPO | Admitting: Family Medicine

## 2022-02-11 ENCOUNTER — Encounter: Payer: Self-pay | Admitting: Oncology
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
## 2022-02-14 NOTE — Patient Instructions (Incomplete)
Preventive Care 40-56 Years Old, Female Preventive care refers to lifestyle choices and visits with your health care provider that can promote health and wellness. Preventive care visits are also called wellness exams. What can I expect for my preventive care visit? Counseling Your health care provider may ask you questions about your: Medical history, including: Past medical problems. Family medical history. Pregnancy history. Current health, including: Menstrual cycle. Method of birth control. Emotional well-being. Home life and relationship well-being. Sexual activity and sexual health. Lifestyle, including: Alcohol, nicotine or tobacco, and drug use. Access to firearms. Diet, exercise, and sleep habits. Work and work environment. Sunscreen use. Safety issues such as seatbelt and bike helmet use. Physical exam Your health care provider will check your: Height and weight. These may be used to calculate your BMI (body mass index). BMI is a measurement that tells if you are at a healthy weight. Waist circumference. This measures the distance around your waistline. This measurement also tells if you are at a healthy weight and may help predict your risk of certain diseases, such as type 2 diabetes and high blood pressure. Heart rate and blood pressure. Body temperature. Skin for abnormal spots. What immunizations do I need?  Vaccines are usually given at various ages, according to a schedule. Your health care provider will recommend vaccines for you based on your age, medical history, and lifestyle or other factors, such as travel or where you work. What tests do I need? Screening Your health care provider may recommend screening tests for certain conditions. This may include: Lipid and cholesterol levels. Diabetes screening. This is done by checking your blood sugar (glucose) after you have not eaten for a while (fasting). Pelvic exam and Pap test. Hepatitis B test. Hepatitis C  test. HIV (human immunodeficiency virus) test. STI (sexually transmitted infection) testing, if you are at risk. Lung cancer screening. Colorectal cancer screening. Mammogram. Talk with your health care provider about when you should start having regular mammograms. This may depend on whether you have a family history of breast cancer. BRCA-related cancer screening. This may be done if you have a family history of breast, ovarian, tubal, or peritoneal cancers. Bone density scan. This is done to screen for osteoporosis. Talk with your health care provider about your test results, treatment options, and if necessary, the need for more tests. Follow these instructions at home: Eating and drinking  Eat a diet that includes fresh fruits and vegetables, whole grains, lean protein, and low-fat dairy products. Take vitamin and mineral supplements as recommended by your health care provider. Do not drink alcohol if: Your health care provider tells you not to drink. You are pregnant, may be pregnant, or are planning to become pregnant. If you drink alcohol: Limit how much you have to 0-1 drink a day. Know how much alcohol is in your drink. In the [place] one drink equals one 12 oz bottle of beer (355 mL), one 5 oz glass of wine (148 mL), or one 1 oz glass of hard liquor (44 mL). Lifestyle Brush your teeth every morning and night with fluoride toothpaste. Floss one time each day. Exercise for at least 30 minutes 5 or more days each week. Do not use any products that contain nicotine or tobacco. These products include cigarettes, chewing tobacco, and vaping devices, such as e-cigarettes. If you need help quitting, ask your health care provider. Do not use drugs. If you are sexually active, practice safe sex. Use a condom or other form of protection to   prevent STIs. If you do not wish to become pregnant, use a form of birth control. If you plan to become pregnant, see your health care provider for a  prepregnancy visit. Take aspirin only as told by your health care provider. Make sure that you understand how much to take and what form to take. Work with your health care provider to find out whether it is safe and beneficial for you to take aspirin daily. Find healthy ways to manage stress, such as: Meditation, yoga, or listening to music. Journaling. Talking to a trusted person. Spending time with friends and family. Minimize exposure to UV radiation to reduce your risk of skin cancer. Safety Always wear your seat belt while driving or riding in a vehicle. Do not drive: If you have been drinking alcohol. Do not ride with someone who has been drinking. When you are tired or distracted. While texting. If you have been using any mind-altering substances or drugs. Wear a helmet and other protective equipment during sports activities. If you have firearms in your house, make sure you follow all gun safety procedures. Seek help if you have been physically or sexually abused. What's next? Visit your health care provider once a year for an annual wellness visit. Ask your health care provider how often you should have your eyes and teeth checked. Stay up to date on all vaccines. This information is not intended to replace advice given to you by your health care provider. Make sure you discuss any questions you have with your health care provider. Document Revised: 08/08/2020 Document Reviewed: 08/08/2020 Elsevier Patient Education  [place].

## 2022-02-18 ENCOUNTER — Encounter: Payer: BC Managed Care – PPO | Admitting: Family Medicine

## 2022-02-18 DIAGNOSIS — Z1211 Encounter for screening for malignant neoplasm of colon: Secondary | ICD-10-CM

## 2022-02-18 DIAGNOSIS — Z0000 Encounter for general adult medical examination without abnormal findings: Secondary | ICD-10-CM

## 2022-03-26 ENCOUNTER — Encounter: Payer: BC Managed Care – PPO | Admitting: Family Medicine

## 2023-03-30 ENCOUNTER — Ambulatory Visit: Payer: Self-pay | Admitting: Family Medicine

## 2023-03-30 ENCOUNTER — Encounter: Payer: Self-pay | Admitting: Family Medicine

## 2023-03-30 ENCOUNTER — Encounter: Payer: Self-pay | Admitting: Oncology

## 2023-03-30 VITALS — BP 148/80 | HR 97 | Resp 16 | Ht 67.0 in | Wt 211.0 lb

## 2023-03-30 DIAGNOSIS — R7303 Prediabetes: Secondary | ICD-10-CM

## 2023-03-30 DIAGNOSIS — F411 Generalized anxiety disorder: Secondary | ICD-10-CM

## 2023-03-30 DIAGNOSIS — Z862 Personal history of diseases of the blood and blood-forming organs and certain disorders involving the immune mechanism: Secondary | ICD-10-CM

## 2023-03-30 DIAGNOSIS — E785 Hyperlipidemia, unspecified: Secondary | ICD-10-CM

## 2023-03-30 DIAGNOSIS — F3340 Major depressive disorder, recurrent, in remission, unspecified: Secondary | ICD-10-CM

## 2023-03-30 DIAGNOSIS — G4452 New daily persistent headache (NDPH): Secondary | ICD-10-CM

## 2023-03-30 DIAGNOSIS — I10 Essential (primary) hypertension: Secondary | ICD-10-CM

## 2023-03-30 MED ORDER — BUSPIRONE HCL 5 MG PO TABS: 5.0000 mg | ORAL_TABLET | Freq: Every day | ORAL | 0 refills | Status: DC

## 2023-03-30 MED ORDER — BACLOFEN 10 MG PO TABS: 10.0000 mg | ORAL_TABLET | Freq: Three times a day (TID) | ORAL | 0 refills | Status: DC | PRN

## 2023-03-30 MED ORDER — ATENOLOL 25 MG PO TABS: 25.0000 mg | ORAL_TABLET | Freq: Every evening | ORAL | 0 refills | Status: DC

## 2023-03-30 MED ORDER — AMLODIPINE BESYLATE 10 MG PO TABS: 5.0000 mg | ORAL_TABLET | Freq: Every day | ORAL | 0 refills | Status: DC

## 2023-03-30 MED ORDER — ESCITALOPRAM OXALATE 10 MG PO TABS: 10.0000 mg | ORAL_TABLET | Freq: Every day | ORAL | 1 refills | Status: DC

## 2023-03-30 NOTE — Patient Instructions (Signed)
Analgesic Rebound Headache An analgesic rebound headache is a secondary disorder that is caused by the overuse of pain medicine (analgesic) to treat the original (primary) headache. It is sometimes called a medication overuse headache or a drug-induced headache. Any type of primary headache can return as a rebound headache if a person regularly takes pain medicine. The types of primary headaches that are commonly related to rebound headaches include: Migraines. Tension headaches. These are caused by tense muscles in the head and neck area. Cluster headaches. These happen on one side of the head and around the eye. If rebound headaches continue, they can become long-term, daily headaches. What are the causes? Rebound headaches may be caused by frequent use of: Over-the-counter medicines, such as aspirin, ibuprofen, and acetaminophen. Sinus-relief medicines. Medicines that contain caffeine. Narcotic pain medicines, such as codeine and oxycodone. Some prescription migraine medicines. What are the signs or symptoms? The symptoms of a rebound headache are the same as the symptoms of the primary headache. Symptoms of specific types of headaches include: Migraine headache Pulsing or throbbing pain on one or both sides of the head. Severe pain that makes it hard to do daily activities. Pain that gets worse with physical activity. Nausea, vomiting, or both. Pain that may get worse around bright lights, loud noises, or smells. Vision changes. Numbness in one or both arms. Tension headache Pressure around the head. Dull, aching head pain. Pain felt over the front and sides of the head. Tenderness in the muscles of the head, neck, and shoulders. Cluster headache Severe pain that begins in or around one eye or temple. Droopy or swollen eyelid, or redness and tearing in the eye on the same side as the pain. One-sided head pain. Nausea. Runny nose. Sweaty, pale facial skin. Restlessness. How is  this diagnosed? Rebound headaches are diagnosed by reviewing: Your medical history, including a description of your primary headaches. Your pain medicines for your primary headaches and how often you take them. How is this treated? Rebound headaches may be treated or managed by: Stopping frequent use of pain medicine. This may make your headaches worse at first, but the pain should then become less manageable and less frequent and severe. Seeing a headache specialist. They may be able to help you manage your headaches and help make sure there is not another cause of the headaches. Using methods of stress relief, such as acupuncture, counseling, biofeedback, and massage. Follow these instructions at home: Medicines  Take over-the-counter and prescription medicines only as told by your health care provider. Stop the repeated use of pain medicine as told by your provider. Stopping can be hard. Carefully follow instructions from your provider. Lifestyle Do not drink alcohol. Do not use any products that contain nicotine or tobacco. These products include cigarettes, chewing tobacco, and vaping devices, such as e-cigarettes. If you need help quitting, ask your provider. Get 7-9 hours of sleep each night, or the amount recommended by your provider. Find ways to manage stress, such as acupuncture, counseling, biofeedback, and massage. Exercise regularly. Exercise for at least 30 minutes, 5 times each week. General instructions Avoid things that may bring on (trigger) your primary headaches, such as certain foods. Contact a health care provider if: Medicine does not help your migraine. Your pain keeps coming back even with medicine. Get help right away if: You have new headache pain. You have headache pain that is different than what you have felt in the past. You have numbness or tingling in your arms or  legs. You have changes in your speech or vision. This information is not intended to  replace advice given to you by your health care provider. Make sure you discuss any questions you have with your health care provider. Document Revised: 10/07/2021 Document Reviewed: 10/07/2021 Elsevier Patient Education  2024 [employer].
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
## 2023-04-06 ENCOUNTER — Ambulatory Visit: Payer: Self-pay

## 2023-04-06 VITALS — BP 134/72

## 2023-04-06 DIAGNOSIS — I10 Essential (primary) hypertension: Secondary | ICD-10-CM

## 2023-04-06 NOTE — Progress Notes (Signed)
 Patient is in office today for a nurse visit for Blood Pressure Check. Patient blood pressure was 134/72, Patient No chest pain, No shortness of breath, No dyspnea on exertion, No orthopnea, No paroxysmal nocturnal dyspnea, No edema, No palpitations, No syncope. Pt states no more headaches and has been compliant with medications.

## 2023-04-07 ENCOUNTER — Ambulatory Visit: Payer: Self-pay

## 2023-04-28 ENCOUNTER — Encounter: Payer: Self-pay | Admitting: Emergency Medicine

## 2023-04-28 ENCOUNTER — Ambulatory Visit
Admission: EM | Admit: 2023-04-28 | Discharge: 2023-04-28 | Disposition: A | Discharge status: Home / Self Care | Payer: Self-pay | Attending: Emergency Medicine | Admitting: Emergency Medicine

## 2023-04-28 DIAGNOSIS — J069 Acute upper respiratory infection, unspecified: Secondary | ICD-10-CM | POA: Insufficient documentation

## 2023-04-28 DIAGNOSIS — Z20822 Contact with and (suspected) exposure to covid-19: Secondary | ICD-10-CM | POA: Insufficient documentation

## 2023-04-28 LAB — RESP PANEL BY RT-PCR (FLU A&B, COVID) ARPGX2
Influenza A by PCR: NEGATIVE
Influenza B by PCR: NEGATIVE
SARS Coronavirus 2 by RT PCR: NEGATIVE

## 2023-04-28 MED ORDER — FLUTICASONE PROPIONATE 50 MCG/ACT NA SUSP: 2.0000 | Freq: Every day | NASAL | 0 refills | Status: AC

## 2023-04-28 MED ORDER — PROMETHAZINE-DM 6.25-15 MG/5ML PO SYRP: 5.0000 mL | ORAL_SOLUTION | Freq: Four times a day (QID) | ORAL | 0 refills | Status: DC | PRN

## 2023-04-28 MED ORDER — ALBUTEROL SULFATE HFA 108 (90 BASE) MCG/ACT IN AERS: 1.0000 | INHALATION_SPRAY | RESPIRATORY_TRACT | 0 refills | Status: DC | PRN

## 2023-04-28 MED ORDER — PREDNISONE 20 MG PO TABS
40.0000 mg | ORAL_TABLET | Freq: Every day | ORAL | 0 refills | Status: AC
Start: 2023-04-28 — End: 2023-05-03
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
## 2023-04-28 NOTE — Discharge Instructions (Signed)
 Your COVID and influenza testing are negative.    Take two puffs from your albuterol inhaler with your spacer every 4 hours for 2 days, then every 6 hours for 2 days, then as needed. You can back off if you start to improve  sooner. Finish the steroids unless your doctor tells you to stop.  Take tylenol 1 gram combined with  600 mg of motrin up to 3-4 times a day as needed for pain for the next few days. Make sure you drink extra fluids.   Nasal congestion: Mucinex/Mucinex D as long as blood pressure is well-controlled, Flonase, saline nasal irrigation with a NeilMed sinus rinse and distilled water as often as you want.  This will prevent a bacterial sinus infection.  Promethazine DM for cough..  If the spacer is too expensive at the pharmacy, you can get an AeroChamber Z-Stat off of [employer] for about $10-$15.  Go to www.goodrx.com  or www.costplusdrugs.com to look up your medications. This will give you a list of where you can find your prescriptions at the most affordable prices. Or ask the pharmacist what the cash price is, or if they have any other discount programs available to help make your medication more affordable. This can be less expensive than what you would pay with insurance.

## 2023-04-28 NOTE — ED Notes (Signed)
 Discharged by Sharee Holster CMA

## 2023-04-28 NOTE — ED Triage Notes (Signed)
 Patient presents with c/o productive cough, nasal congestion, and nausea x 4 days. Patient also states she lost her sense of smell and taste.

## 2023-06-10 ENCOUNTER — Other Ambulatory Visit: Payer: Self-pay | Admitting: Family Medicine

## 2023-06-10 DIAGNOSIS — I10 Essential (primary) hypertension: Secondary | ICD-10-CM

## 2023-06-10 MED ORDER — AMLODIPINE BESYLATE 10 MG PO TABS: 5.0000 mg | ORAL_TABLET | Freq: Every day | ORAL | 0 refills | Status: DC

## 2023-06-30 ENCOUNTER — Ambulatory Visit: Payer: Self-pay | Admitting: Family Medicine

## 2023-07-13 ENCOUNTER — Ambulatory Visit: Payer: Self-pay | Admitting: Family Medicine

## 2023-11-27 ENCOUNTER — Ambulatory Visit: Payer: Self-pay

## 2023-11-27 NOTE — Telephone Encounter (Signed)
 FYI Only or Action Required?: FYI only for provider.  Patient was last seen in primary care on 03/30/2023 by Glenard Mire, MD.  Called Nurse Triage reporting Headache.  Symptoms began about a month ago.  Interventions attempted: Nothing.  Symptoms are: gradually worsening.  Triage Disposition: Go to ED Now (Notify PCP)  Patient/caregiver understands and will follow disposition?: No, refuses disposition  Copied from CRM [id number]. Topic: Clinical - Red Word Triage >> Nov 27, 2023  2:16 PM Fonda T wrote: [facility] that prompted transfer to Nurse Triage: Patient calling, states she has been having worsening headaches, and has had to stop taking blood pressure medications due to not having insurance.  Patient is requesting to be evaluated as soon as possible. Reason for Disposition  [1] SEVERE headache (e.g., excruciating) AND [2] worst headache of life  Answer Assessment - Initial Assessment Questions Pt calls for HA's that have been ongoing for the last month and last all day everyday. Pt was taking Amlodipine  for bp; stopped few mos ago; didn't have insurance. Denies taking bp since dc meds. States the HA makes her eyes jump. RN recommended ED immediately, pt refused. RN attempted to educate on situation and prompt evaluation and risks, pt continued to denies. States she will take an appointment with her PCP. RN attempted to see if pt was comfortable being scheduled with different provider to be seen sooner, pt denied and stated she would see her doctor Nov 7. RN educated on escalation criteria and again recommended ED. CAL notified of ED refusal.   1. LOCATION: Where does it hurt?      Whole head 2. ONSET: When did the headache start? (e.g., minutes, hours, days)      A couple months ago 3. PATTERN: Does the pain come and go, or has it been constant since it started?     Pretty much constantly 4. SEVERITY: How bad is the pain? and What does it keep you from doing?  (e.g.,  Scale 1-10; mild, moderate, or severe)     5-10/10 5. RECURRENT SYMPTOM: Have you ever had headaches before? If Yes, ask: When was the last time? and What happened that time?      denies 6. CAUSE: What do you think is causing the headache?     Unsure, maybe BP 7. MIGRAINE: Have you been diagnosed with migraine headaches? If Yes, ask: Is this headache similar?      denies 8. HEAD INJURY: Has there been any recent injury to your head?      denies 9. OTHER SYMPTOMS: Do you have any other symptoms? (e.g., fever, stiff neck, eye pain, sore throat, cold symptoms)     Eye issues and slight neck pain  Protocols used: Headache-A-AH

## 2024-01-01 ENCOUNTER — Ambulatory Visit (INDEPENDENT_AMBULATORY_CARE_PROVIDER_SITE_OTHER): Payer: Self-pay | Admitting: Family Medicine

## 2024-01-01 ENCOUNTER — Encounter: Payer: Self-pay | Admitting: Oncology

## 2024-01-01 ENCOUNTER — Encounter: Payer: Self-pay | Admitting: Family Medicine

## 2024-01-01 VITALS — BP 138/82 | HR 86 | Resp 16 | Ht 67.0 in | Wt 201.7 lb

## 2024-01-01 DIAGNOSIS — F4321 Adjustment disorder with depressed mood: Secondary | ICD-10-CM | POA: Diagnosis not present

## 2024-01-01 DIAGNOSIS — Z23 Encounter for immunization: Secondary | ICD-10-CM | POA: Diagnosis not present

## 2024-01-01 DIAGNOSIS — I10 Essential (primary) hypertension: Secondary | ICD-10-CM

## 2024-01-01 DIAGNOSIS — R7303 Prediabetes: Secondary | ICD-10-CM | POA: Diagnosis not present

## 2024-01-01 DIAGNOSIS — F339 Major depressive disorder, recurrent, unspecified: Secondary | ICD-10-CM

## 2024-01-01 DIAGNOSIS — G4700 Insomnia, unspecified: Secondary | ICD-10-CM

## 2024-01-01 DIAGNOSIS — Z862 Personal history of diseases of the blood and blood-forming organs and certain disorders involving the immune mechanism: Secondary | ICD-10-CM

## 2024-01-01 DIAGNOSIS — F411 Generalized anxiety disorder: Secondary | ICD-10-CM

## 2024-01-01 DIAGNOSIS — G44209 Tension-type headache, unspecified, not intractable: Secondary | ICD-10-CM

## 2024-01-01 DIAGNOSIS — E785 Hyperlipidemia, unspecified: Secondary | ICD-10-CM

## 2024-01-01 MED ORDER — ESCITALOPRAM OXALATE 10 MG PO TABS: 10.0000 mg | ORAL_TABLET | Freq: Every day | ORAL | 0 refills | Status: AC

## 2024-01-01 MED ORDER — ATENOLOL 25 MG PO TABS: 25.0000 mg | ORAL_TABLET | Freq: Every day | ORAL | 0 refills | Status: AC

## 2024-01-01 MED ORDER — BACLOFEN 10 MG PO TABS
10.0000 mg | ORAL_TABLET | Freq: Three times a day (TID) | ORAL | 0 refills | Status: AC
Start: 2024-01-01 — End: ?

## 2024-01-01 MED ORDER — BUSPIRONE HCL 5 MG PO TABS: 5.0000 mg | ORAL_TABLET | Freq: Three times a day (TID) | ORAL | 0 refills | Status: AC | PRN

## 2024-01-01 MED ORDER — TRAZODONE HCL 50 MG PO TABS: 25.0000 mg | ORAL_TABLET | Freq: Every evening | ORAL | 0 refills | Status: AC | PRN
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
## 2024-02-22 ENCOUNTER — Other Ambulatory Visit: Payer: Self-pay

## 2024-02-22 ENCOUNTER — Telehealth: Payer: Self-pay | Admitting: Family Medicine

## 2024-02-22 ENCOUNTER — Emergency Department
Admission: EM | Admit: 2024-02-22 | Discharge: 2024-02-22 | Disposition: A | Discharge status: Home / Self Care | Attending: Emergency Medicine | Admitting: Emergency Medicine

## 2024-02-22 DIAGNOSIS — R52 Pain, unspecified: Secondary | ICD-10-CM | POA: Insufficient documentation

## 2024-02-22 DIAGNOSIS — E119 Type 2 diabetes mellitus without complications: Secondary | ICD-10-CM | POA: Insufficient documentation

## 2024-02-22 DIAGNOSIS — R110 Nausea: Secondary | ICD-10-CM | POA: Insufficient documentation

## 2024-02-22 DIAGNOSIS — J111 Influenza due to unidentified influenza virus with other respiratory manifestations: Secondary | ICD-10-CM

## 2024-02-22 DIAGNOSIS — R0981 Nasal congestion: Secondary | ICD-10-CM | POA: Diagnosis not present

## 2024-02-22 DIAGNOSIS — I10 Essential (primary) hypertension: Secondary | ICD-10-CM | POA: Diagnosis not present

## 2024-02-22 LAB — URINALYSIS, ROUTINE W REFLEX MICROSCOPIC
Bilirubin Urine: NEGATIVE
Glucose, UA: NEGATIVE mg/dL
Hgb urine dipstick: NEGATIVE
Ketones, ur: NEGATIVE mg/dL
Nitrite: NEGATIVE
Protein, ur: 30 mg/dL — AB
RBC / HPF: >50 RBC/hpf (ref 0–5)
Specific Gravity, Urine: 1.034 — ABNORMAL HIGH (ref 1.005–1.030)
WBC, UA: >50 WBC/hpf (ref 0–5)
pH: 5 (ref 5.0–8.0)

## 2024-02-22 LAB — COMPREHENSIVE METABOLIC PANEL WITH GFR
ALT: 16 U/L (ref 0–44)
AST: 21 U/L (ref 15–41)
Albumin: 4.2 g/dL (ref 3.5–5.0)
Alkaline Phosphatase: 58 U/L (ref 38–126)
Anion gap: 12 (ref 5–15)
BUN: 15 mg/dL (ref 6–20)
CO2: 25 mmol/L (ref 22–32)
Calcium: 9.2 mg/dL (ref 8.9–10.3)
Chloride: 104 mmol/L (ref 98–111)
Creatinine, Ser: 0.78 mg/dL (ref 0.44–1.00)
GFR, Estimated: >60 mL/min
Glucose, Bld: 111 mg/dL — ABNORMAL HIGH (ref 70–99)
Potassium: 3.7 mmol/L (ref 3.5–5.1)
Sodium: 141 mmol/L (ref 135–145)
Total Bilirubin: 0.2 mg/dL (ref 0.0–1.2)
Total Protein: 7.9 g/dL (ref 6.5–8.1)

## 2024-02-22 LAB — CBC
HCT: 39 % (ref 36.0–46.0)
Hemoglobin: 12.1 g/dL (ref 12.0–15.0)
MCH: 26.5 pg (ref 26.0–34.0)
MCHC: 31 g/dL (ref 30.0–36.0)
MCV: 85.3 fL (ref 80.0–100.0)
Platelets: 349 K/uL (ref 150–400)
RBC: 4.57 MIL/uL (ref 3.87–5.11)
RDW: 14.2 % (ref 11.5–15.5)
WBC: 5.2 K/uL (ref 4.0–10.5)
nRBC: 0 % (ref 0.0–0.2)

## 2024-02-22 LAB — LIPASE, BLOOD: Lipase: 22 U/L (ref 11–51)

## 2024-02-22 MED ORDER — ONDANSETRON 4 MG PO TBDP: 4.0000 mg | ORAL_TABLET | Freq: Three times a day (TID) | ORAL | 0 refills | Status: DC | PRN

## 2024-02-22 NOTE — ED Notes (Signed)
 See triage note  Presents with cough   and then developed some diarrhea and vomiting

## 2024-02-22 NOTE — ED Triage Notes (Signed)
 Pt comes with belly pain, diarrhea and vomiting since Saturday. Pt hasn't been able to keep anything down. Pt states cough and pain from coughing pt states hot flashes.

## 2024-02-22 NOTE — Telephone Encounter (Signed)
 Copied from CRM [id number]. Topic: Appointments - Scheduling Inquiry for Clinic >> Feb 22, 2024 11:49 AM [place] MATSU wrote: Reason for CRM: Patient called in to schedule an appointment with PCP per patient she wanted to come in today. I advised that next available is 03/22/24 per patient no she said MD stated she would be able to be seen prior to her running out of medication and that she needs an appointment and is requesting a call back.   May you please assist.

## 2024-02-22 NOTE — ED Provider Notes (Signed)
 "  [facility] Provider Note    Event Date/Time   First MD Initiated Contact with Patient 02/22/24 1406     (approximate)   History   Flu symptoms   HPI  Catherine Christensen is a 58 y.o. female with a history of diabetes, hypertension who presents with complaints of nausea, body aches, fatigue, congestion which developed over the last 3 days.  She denies abdominal pain     Physical Exam   Triage Vital Signs: ED Triage Vitals  Encounter Vitals Group     BP 02/22/24 1224 (!) 165/100     Girls Systolic BP Percentile --      Girls Diastolic BP Percentile --      Boys Systolic BP Percentile --      Boys Diastolic BP Percentile --      Pulse Rate 02/22/24 1224 66     Resp 02/22/24 1224 18     Temp 02/22/24 1224 97.9 F (36.6 C)     Temp src --      SpO2 02/22/24 1224 100 %     Weight 02/22/24 1411 91.5 kg (201 lb 11.5 oz)     Height 02/22/24 1222 1.715 m (5' 7.5)     Head Circumference --      Peak Flow --      Pain Score 02/22/24 1222 8     Pain Loc --      Pain Education --      Exclude from Growth Chart --     Most recent vital signs: Vitals:   02/22/24 1224  BP: (!) 165/100  Pulse: 66  Resp: 18  Temp: 97.9 F (36.6 C)  SpO2: 100%     General: Awake, no distress.  CV:  Good peripheral perfusion.  Resp:  Normal effort.  Abd:  No distention.  Other:     ED Results / Procedures / Treatments   Labs (all labs ordered are listed, but only abnormal results are displayed) Labs Reviewed  COMPREHENSIVE METABOLIC PANEL WITH GFR - Abnormal; Notable for the following components:      Result Value   Glucose, Bld 111 (*)    All other components within normal limits  URINALYSIS, ROUTINE W REFLEX MICROSCOPIC - Abnormal; Notable for the following components:   Color, Urine AMBER (*)    APPearance CLOUDY (*)    Specific Gravity, Urine 1.034 (*)    Protein, ur 30 (*)    Leukocytes,Ua MODERATE (*)    Bacteria, UA FEW (*)    All other  components within normal limits  LIPASE, BLOOD  CBC     EKG     RADIOLOGY     PROCEDURES:  Critical Care performed:   Procedures   MEDICATIONS ORDERED IN ED: Medications - No data to display   IMPRESSION / MDM / ASSESSMENT AND PLAN / ED COURSE  I reviewed the triage vital signs and the nursing notes. Patient's presentation is most consistent with acute illness / injury with system symptoms.   Patient presents with viral symptoms as detailed above, almost certainly caused by influenza which is highly prevalent in the community at this time.  Overall well-appearing and in no acute distress, vital signs generally reassuring, she has a history of high blood pressure.  Lab work reviewed and is unremarkable.  Urinalysis contaminated, she has no dysuria or urinary symptoms  Will Rx ODT Zofran , outpatient follow-up recommended, strict return precautions       FINAL CLINICAL IMPRESSION(S) /  ED DIAGNOSES   Final diagnoses:  Influenza-like illness     Rx / DC Orders   ED Discharge Orders          Ordered    ondansetron  (ZOFRAN -ODT) 4 MG disintegrating tablet  Every 8 hours PRN        02/22/24 1429             Note:  This document was prepared using Dragon voice recognition software and may include unintentional dictation errors.   Arlander Charleston, MD 02/22/24 1517  "

## 2024-02-23 ENCOUNTER — Other Ambulatory Visit: Payer: Self-pay | Admitting: Family Medicine

## 2024-02-23 NOTE — Telephone Encounter (Signed)
 Patient did not know the refill she needed. I explained the only one that needed refills was baclofen  due to her getting a 90 day supply in November for all other medications. She stated she did not feel good  and she could not find medication as this time. I informed her to call back when she is feeling better and know name

## 2024-02-23 NOTE — Telephone Encounter (Signed)
 I sch'd her for 03/04/24 in one of your same days because you are completely booked. Please call in the 30 day supply

## 2024-03-04 ENCOUNTER — Ambulatory Visit: Admitting: Family Medicine

## 2024-03-04 ENCOUNTER — Encounter: Payer: Self-pay | Admitting: Family Medicine

## 2024-03-04 VITALS — BP 134/82 | HR 85 | Resp 16 | Ht 67.5 in | Wt 197.9 lb

## 2024-03-04 DIAGNOSIS — F331 Major depressive disorder, recurrent, moderate: Secondary | ICD-10-CM | POA: Diagnosis not present

## 2024-03-04 DIAGNOSIS — F411 Generalized anxiety disorder: Secondary | ICD-10-CM | POA: Diagnosis not present

## 2024-03-04 DIAGNOSIS — I10 Essential (primary) hypertension: Secondary | ICD-10-CM | POA: Diagnosis not present
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
## 2024-05-02 ENCOUNTER — Ambulatory Visit: Admitting: Family Medicine
